# Patient Record
Sex: Male | Born: 1965
Health system: Southern US, Community
[De-identification: ages and names within clinical notes are randomized; demographics above are authoritative.]

## PROBLEM LIST (undated history)

## (undated) DIAGNOSIS — R2990 Unspecified symptoms and signs involving the nervous system: Secondary | ICD-10-CM

## (undated) DIAGNOSIS — L0292 Furuncle, unspecified: Secondary | ICD-10-CM

## (undated) DIAGNOSIS — R51 Headache: Secondary | ICD-10-CM

## (undated) DIAGNOSIS — I639 Cerebral infarction, unspecified: Secondary | ICD-10-CM

## (undated) DIAGNOSIS — L0293 Carbuncle, unspecified: Secondary | ICD-10-CM

## (undated) DIAGNOSIS — F1490 Cocaine use, unspecified, uncomplicated: Secondary | ICD-10-CM

## (undated) HISTORY — PX: HERNIA REPAIR: SHX51

---

## 1997-09-25 ENCOUNTER — Encounter: Admission: RE | Admit: 1997-09-25 | Discharge: 1997-09-25 | Payer: Self-pay | Admitting: *Deleted

## 1998-01-18 ENCOUNTER — Emergency Department (HOSPITAL_COMMUNITY): Admission: EM | Admit: 1998-01-18 | Discharge: 1998-01-18 | Payer: Self-pay

## 1998-01-19 ENCOUNTER — Emergency Department (HOSPITAL_COMMUNITY): Admission: EM | Admit: 1998-01-19 | Discharge: 1998-01-19 | Payer: Self-pay | Admitting: Emergency Medicine

## 1998-05-11 ENCOUNTER — Encounter: Payer: Self-pay | Admitting: Emergency Medicine

## 1998-05-11 ENCOUNTER — Emergency Department (HOSPITAL_COMMUNITY): Admission: EM | Admit: 1998-05-11 | Discharge: 1998-05-11 | Payer: Self-pay | Admitting: Emergency Medicine

## 1998-11-05 ENCOUNTER — Encounter: Payer: Self-pay | Admitting: Emergency Medicine

## 1998-11-05 ENCOUNTER — Emergency Department (HOSPITAL_COMMUNITY): Admission: EM | Admit: 1998-11-05 | Discharge: 1998-11-05 | Payer: Self-pay | Admitting: Emergency Medicine

## 1999-03-10 ENCOUNTER — Emergency Department (HOSPITAL_COMMUNITY): Admission: EM | Admit: 1999-03-10 | Discharge: 1999-03-10 | Payer: Self-pay | Admitting: Emergency Medicine

## 1999-03-13 ENCOUNTER — Ambulatory Visit (HOSPITAL_BASED_OUTPATIENT_CLINIC_OR_DEPARTMENT_OTHER): Admission: RE | Admit: 1999-03-13 | Discharge: 1999-03-13 | Payer: Self-pay | Admitting: General Surgery

## 1999-03-13 ENCOUNTER — Encounter (INDEPENDENT_AMBULATORY_CARE_PROVIDER_SITE_OTHER): Payer: Self-pay | Admitting: *Deleted

## 1999-12-02 ENCOUNTER — Emergency Department (HOSPITAL_COMMUNITY): Admission: EM | Admit: 1999-12-02 | Discharge: 1999-12-02 | Payer: Self-pay | Admitting: Emergency Medicine

## 2000-01-04 ENCOUNTER — Emergency Department (HOSPITAL_COMMUNITY): Admission: EM | Admit: 2000-01-04 | Discharge: 2000-01-04 | Payer: Self-pay | Admitting: *Deleted

## 2000-03-15 ENCOUNTER — Emergency Department (HOSPITAL_COMMUNITY): Admission: EM | Admit: 2000-03-15 | Discharge: 2000-03-15 | Payer: Self-pay | Admitting: *Deleted

## 2000-03-18 ENCOUNTER — Encounter: Payer: Self-pay | Admitting: General Surgery

## 2000-03-18 ENCOUNTER — Encounter: Admission: RE | Admit: 2000-03-18 | Discharge: 2000-03-18 | Payer: Self-pay | Admitting: General Surgery

## 2000-12-07 ENCOUNTER — Emergency Department (HOSPITAL_COMMUNITY): Admission: EM | Admit: 2000-12-07 | Discharge: 2000-12-07 | Payer: Self-pay | Admitting: Emergency Medicine

## 2000-12-09 ENCOUNTER — Emergency Department (HOSPITAL_COMMUNITY): Admission: EM | Admit: 2000-12-09 | Discharge: 2000-12-09 | Payer: Self-pay | Admitting: Emergency Medicine

## 2001-08-13 ENCOUNTER — Encounter: Payer: Self-pay | Admitting: Emergency Medicine

## 2001-08-13 ENCOUNTER — Emergency Department (HOSPITAL_COMMUNITY): Admission: EM | Admit: 2001-08-13 | Discharge: 2001-08-13 | Payer: Self-pay | Admitting: Emergency Medicine

## 2001-12-04 ENCOUNTER — Emergency Department (HOSPITAL_COMMUNITY): Admission: EM | Admit: 2001-12-04 | Discharge: 2001-12-04 | Payer: Self-pay | Admitting: Emergency Medicine

## 2002-10-09 ENCOUNTER — Emergency Department (HOSPITAL_COMMUNITY): Admission: EM | Admit: 2002-10-09 | Discharge: 2002-10-09 | Payer: Self-pay | Admitting: Emergency Medicine

## 2002-12-05 ENCOUNTER — Emergency Department (HOSPITAL_COMMUNITY): Admission: EM | Admit: 2002-12-05 | Discharge: 2002-12-05 | Payer: Self-pay | Admitting: *Deleted

## 2002-12-09 ENCOUNTER — Emergency Department (HOSPITAL_COMMUNITY): Admission: AD | Admit: 2002-12-09 | Discharge: 2002-12-09 | Payer: Self-pay | Admitting: Family Medicine

## 2003-11-04 ENCOUNTER — Emergency Department (HOSPITAL_COMMUNITY): Admission: EM | Admit: 2003-11-04 | Discharge: 2003-11-04 | Payer: Self-pay | Admitting: Emergency Medicine

## 2004-03-10 ENCOUNTER — Emergency Department (HOSPITAL_COMMUNITY): Admission: EM | Admit: 2004-03-10 | Discharge: 2004-03-10 | Payer: Self-pay | Admitting: Emergency Medicine

## 2004-03-11 ENCOUNTER — Emergency Department (HOSPITAL_COMMUNITY): Admission: EM | Admit: 2004-03-11 | Discharge: 2004-03-11 | Payer: Self-pay | Admitting: Emergency Medicine

## 2004-04-15 ENCOUNTER — Emergency Department (HOSPITAL_COMMUNITY): Admission: EM | Admit: 2004-04-15 | Discharge: 2004-04-15 | Payer: Self-pay | Admitting: Emergency Medicine

## 2005-04-30 ENCOUNTER — Emergency Department (HOSPITAL_COMMUNITY): Admission: EM | Admit: 2005-04-30 | Discharge: 2005-04-30 | Payer: Self-pay | Admitting: Emergency Medicine

## 2005-06-18 ENCOUNTER — Inpatient Hospital Stay (HOSPITAL_COMMUNITY): Admission: EM | Admit: 2005-06-18 | Discharge: 2005-06-22 | Payer: Self-pay | Admitting: Emergency Medicine

## 2005-06-18 ENCOUNTER — Ambulatory Visit: Payer: Self-pay | Admitting: Cardiology

## 2005-06-21 ENCOUNTER — Encounter: Payer: Self-pay | Admitting: Cardiology

## 2005-09-30 ENCOUNTER — Emergency Department (HOSPITAL_COMMUNITY): Admission: EM | Admit: 2005-09-30 | Discharge: 2005-09-30 | Payer: Self-pay | Admitting: Emergency Medicine

## 2006-04-09 ENCOUNTER — Inpatient Hospital Stay (HOSPITAL_COMMUNITY): Admission: EM | Admit: 2006-04-09 | Discharge: 2006-04-12 | Payer: Self-pay | Admitting: Emergency Medicine

## 2006-04-11 ENCOUNTER — Encounter (INDEPENDENT_AMBULATORY_CARE_PROVIDER_SITE_OTHER): Payer: Self-pay | Admitting: Interventional Cardiology

## 2006-10-28 ENCOUNTER — Emergency Department (HOSPITAL_COMMUNITY): Admission: EM | Admit: 2006-10-28 | Discharge: 2006-10-28 | Payer: Self-pay | Admitting: Emergency Medicine

## 2007-06-07 ENCOUNTER — Emergency Department (HOSPITAL_COMMUNITY): Admission: EM | Admit: 2007-06-07 | Discharge: 2007-06-07 | Payer: Self-pay | Admitting: Emergency Medicine

## 2008-03-29 ENCOUNTER — Emergency Department (HOSPITAL_COMMUNITY): Admission: EM | Admit: 2008-03-29 | Discharge: 2008-03-29 | Payer: Self-pay | Admitting: Emergency Medicine

## 2009-08-16 ENCOUNTER — Emergency Department: Payer: Self-pay | Admitting: Internal Medicine

## 2010-01-21 ENCOUNTER — Emergency Department (HOSPITAL_COMMUNITY)
Admission: EM | Admit: 2010-01-21 | Discharge: 2010-01-21 | Payer: Self-pay | Source: Home / Self Care | Admitting: Emergency Medicine

## 2010-07-10 NOTE — Discharge Summary (Signed)
NAME:  Derek French, Derek French                ACCOUNT NO.:  [id number]   MEDICAL RECORD NO.:  [id number]          PATIENT TYPE:  INP   LOCATION:  3016                         FACILITY:  [facility]   PHYSICIAN:  Pramod P. Pearlean Brownie, MD    DATE OF BIRTH:  1965-09-22   DATE OF ADMISSION:  04/09/2006  DATE OF DISCHARGE:  04/12/2006                               DISCHARGE SUMMARY   ADMISSION DIAGNOSIS:  Code stroke.   DISCHARGE DIAGNOSES:  1. Right brain transient ischemic attack, likely due to cocaine-      related spasm.  2. Drug abuse.  3. Previous stroke.   [name] is a 40-year African American gentleman who woke up on the day  of admission with severe headache and left-sided numbness and tingling  mainly involving his face and upper extremity.  He has stated that he  had had similar symptoms in the past with his previous strokes in April  2007.  This time the symptoms appeared to be a little worse than the  last occasion.  He also had some slurred speech this time.  He called  EMS but the patient's symptoms started improving and by the time he was  seen in the emergency room by Dr. Deneen Harts it was more than 3  hours from the onset of symptoms and his speech was back to normal and  his tingling also appeared to be improved greatly.  The patient's CT  scan on admission showed no acute hemorrhage or infarct.  He was  admitted to the stroke unit for further evaluation.  MRI scan of the  brain showed no evidence of acute or old strokes.  MRA of the brain was  unremarkable.  A 2-D echo showed normal ejection fraction without  obvious cardiac source of embolism.  Telemetry monitoring did not reveal  cardiac arrhythmias.  Carotid ultrasound showed no significant stenosis  on either side.  Lipid profile was normal except for LDL of 104.  Hemoglobin A1c and homocystine were both normal.  The patient was  started on aspirin for stroke prevention and was counseled to quit  smoking as well as cocaine and  doing drugs.  His urine drug screen was  positive for cocaine.  The patient claimed this was several weeks ago.  He was seen by physical, occupational therapy and there were no long-  term needs identified.  He also had an EEG, the results of which were  pending at the time of discharge.  His admission labs, blood  chemistries, CBC otherwise were unremarkable.  The patient was  discharged home in stable condition, advised to take aspirin a day.  Quit smoking and abusing cocaine and other drugs.  He was advised to  follow-up in the future as needed.   DISCHARGE MEDICATIONS:  Aspirin 325 mg a day.           ______________________________  Sunny Schlein. Pearlean Brownie, MD     PPS/MEDQ  D:  04/12/2006  T:  04/12/2006  Job:  [id number]

## 2010-07-10 NOTE — Discharge Summary (Signed)
NAME:  Derek, French                ACCOUNT NO.:  [id number]   MEDICAL RECORD NO.:  [id number]          PATIENT TYPE:  INP   LOCATION:  3013                         FACILITY:  [facility]   PHYSICIAN:  Pramod P. Pearlean Brownie, MD    DATE OF BIRTH:  [birth year]   DATE OF ADMISSION:  06/18/2005  DATE OF DISCHARGE:  06/22/2005                                 DISCHARGE SUMMARY   ADMISSION DIAGNOSIS:  Code stroke.   DISCHARGE DIAGNOSES:  1.  Small right middle cerebral artery infarction, not visualized on MRI,      secondary to cocaine abuse.  2.  Cocaine abuse.  3.  Smoking.   HOSPITAL COURSE:  [name] is a 45 year old African American gentleman who  developed sudden onset of left body numbness, incoordination, and vision  difficulties at 10 a.m. on the day of admission.  He presented to [facility] more than 3 hours after onset of his symptoms.  When  evaluated in the emergency room, he demonstrated numbness and hemianopsia  left nasolabial fold asymmetry, some sensory incoordination in left upper  extremity, and mild left lower extremity weakness.  On the NIH stroke scale,  he scored 7.  The patient was not a candidate for thrombolysis due to the  timing of the deficit.  He, however, qualified for participation in the  SENTIS acute stroke research protocol.  After discussing risks and benefits  with the patient, he agreed.  The patient was randomized into the medical  treatment part of the study.  He was admitted to the Stroke Unit for  telemetry monitoring.  He underwent an MRI scan of the brain, which did not  reveal any acute definite infarct.  The patient's deficits were persisting.  The rest of the labs showed normal homocysteine and hemoglobin A1c.  LDL  cholesterol was slightly elevated at 110, total cholesterol was 159, HDL was  40, triglycerides 42.  Hypercoagulable labs were also sent and were negative  and were all normal.  A 2-D echo showed normal ejection fraction  without  obvious cardiac source of embolism.  He underwent a transesophageal  echocardiogram on the day of discharge, which did not show any obvious  cardiac source of embolism or patent foramen ovale.  He was started on  aspirin for stroke prevention.  He complained of headache for which he was  started on Depakote.  The patient, the day of discharge, was stable.  He  still had some blurred vision in the left eye more towards the periphery, as  well as some left-sided finger-to-nose and knee-to-heel sensory  incoordination, and complained of some subjective sensory loss on the left  side.  His leg strength had improved, but he was still dragging his left  leg.  He was seen by Physical and Occupational Therapy and felt not to  require any long-term needs.  A limited MRI scan of the brain was repeated  on the day of discharge, and again no definite acute stroke was seen;  however, there was an area of nonspecific white matter hyperintensity in the  left  posterior periventricular region.  There was in question of a disease  versus cocaine vasculitis.  The patient did not have any prior significant  headaches to suggest migraines.  The patient was counseled to quit cocaine  and marijuana abuse.  He was arranged to call Dr. Marlis Edelson office to set up  outpatient BCD bubble study and emboli monitoring.  Arrangements were made  to follow the patient at outpatient physical and occupational therapy.  He  was advised to follow up with Dr. Pearlean Brownie in the office in a few months.   DISCHARGE MEDICATIONS:  1.  Aspirin 325 mg a day.  2.  Depakote ER 500 mg a day.           ______________________________  Sunny Schlein. Pearlean Brownie, MD     PPS/MEDQ  D:  08/12/2005  T:  08/12/2005  Job:  [id number]

## 2010-07-10 NOTE — H&P (Signed)
NAME:  Derek French, Derek French                ACCOUNT NO.:  [id number]   MEDICAL RECORD NO.:  [id number]          PATIENT TYPE:  INP   LOCATION:  1825                         FACILITY:  [facility]   PHYSICIAN:  Pramod P. Pearlean Brownie, MD    DATE OF BIRTH:  February 09, 1966   DATE OF ADMISSION:  06/18/2005  DATE OF DISCHARGE:                                HISTORY & PHYSICAL   REFERRING PHYSICIAN:  Joen Laura, MD   REASON FOR REFERRAL:  Code stroke.   HISTORY OF PRESENT ILLNESS:  [name] is a 45 year old African-American  gentleman who developed sudden onset of left body numbness as well as  incoordination and vision difficulties.  The patient states he has been  having the headache on and off for the last couple of weeks but this morning  at 10 o'clock he noticed he could not walk right and his left side of the  body was numb and his coordination was off.  He also noticed vision  difficulty from left field of vision.  He denies any history of slurred  speech or vertigo.  No prior history of stroke, TIA, seizures, or migraine  headaches.  He does get some remote headaches occasionally every few months  which are not quite severe.   PAST MEDICAL HISTORY:  Past medical history unremarkable except for cocaine  abuse.  He admits to having used cocaine about a week ago last.  He does  smoke one pack of cigarettes every 3-4 days.   PAST SURGICAL HISTORY:  None.   MEDICATION ALLERGIES:  NONE.   SOCIAL HISTORY:  The patient lives with his family at home.  He smokes and  uses cocaine.  He also admits to drinking a couple of alcohol drinks every  night.  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   REVIEW OF SYSTEMS:  Negative for any chest pain, fever, cough, shortness of  breath, diarrhea or other illness.   PHYSICAL EXAMINATION:  GENERAL:  Pleasant young healthy-looking African-  American gentleman not in distress.  VITAL SIGNS:  Afebrile, pulse 89 per minute, regular sinus, blood pressure  117/74, respiratory rate 20 per minute, saturations 97% on room air.  HEAD AND NECK:  Head is nontraumatic.  Neck is supple without bruit.  ENT  exam unremarkable.  CARDIAC EXAM:  No murmur or gallop.  LUNGS:  Clear to auscultation.  ABDOMEN:  Soft, nontender.  NEUROLOGICAL:  The patient is pleasant, awake, alert, cooperative.  There is  no aphasia, apraxia, or dysarthria.  Pupils equal/reactive to light and  accommodation.  Face is symmetric bilaterally.  Movements are normal.  Tongue is midline.  The patient has full range of eye movements.  He has a  dense left homonymous hemianopsia.  He has minimum left nasolabial fold  asymmetry.  He has a good cough and gag.  Tongue is midline.  Motor system  exam reveals no upper extremity drift; however, he has weakness of the left  grip and elbow extensors.  Fine finger movements are diminished on the left.  He __________  left upper extremity.  Left lower extremity exam reveals some  significant drift.  He has grade 2/3 strength in left lower extremity.  He  has decreased sensation on the left body including position sense as well as  vibration sense.  He has astereognosis in the left hand.  He has finger-to-  nose dysmetria on the left which is sensory ataxia.  He __________  as well.  His gait was not tested.   DATA REVIEWED TODAY:  CT scan of the head (noncontrast study) reveals no  acute abnormalities in the left hemisphere, no hemorrhage or large signs of  early infarct seen.  EKG reveals normal sinus rhythm.  Admission labs are  pending at this time.   IMPRESSION:  A 45 year old gentleman with sudden onset of left-sided  weakness, numbness and sensory ataxia as well as visual field defects likely  secondary to right hemispheric infarction due to right __________  middle  cerebral artery branch infarct.  Etiology to be determined at the present  time.  He has no vascular risk factors except cocaine abuse and smoking.  The patient has  presented within 6-9 hours of onset of his symptoms.  He  will not qualify for aggressive intervention however he may qualify for  participation in the SENTIS acute stroke study protocol.   PLAN:  I have discussed participation in the SENTIS protocol with the  patient and answered his questions about the study.  He has expressed  interest.  We will obtain baseline CT perfusion study and randomize him into  the study if he qualifies.  He will be admitted to the stroke service and to  the ICU with strict monitoring.  We will check MRI scan of the brain, MRA of  the brain and neck, echocardiogram, Doppler studies and stroke risk  stratification labs.  Physical, occupational and speech therapy consults.  Check urine drug screen.  I spent 1-1/2 hours of critical care time at the  patient's bedside directing his care and evaluating him.           ______________________________  Sunny Schlein. Pearlean Brownie, MD     PPS/MEDQ  D:  06/18/2005  T:  06/18/2005  Job:  [id number]
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
## 2010-07-10 NOTE — Procedures (Signed)
ELECTROENCEPHALOGRAM:  #[id number].   CLINICAL HISTORY:  The patient is a 45 year old who had an episode of  headache, during which time he was able to hear but was unable to see.  He complained of numbness and tingling on the left side.  This study is  being done due to the presence of possible seizure activity - code  #780.39, code #782.0.   DESCRIPTION OF PROCEDURE:  The tracing is carried out on a 32-channel  digital Cadwell recorder reformatted into 16-channel montages with one  devoted to electrocardiogram.  The patient was awake during the  recording.  The International 10/20 system lead placement was used.   DESCRIPTION OF FINDINGS:  Dominant frequency is 9 Hz, 20-30 microvolt  activity.  This is all regulated.  The background activity shows a mixed  frequency of beta and delta range activity.  Photic stimulation was  carried out and induced a driving response from 0-45 Hz.   The electrocardiogram showed a regular sinus rhythm with ventricular  response of 60 beats per minute.   IMPRESSION:  Normal waking record.      Deanna Artis. Sharene Skeans, M.D.  Electronically Signed     [id number]  D:  04/12/2006 01:48:47  T:  04/12/2006 10:50:23  Job #:  [id number]   cc:   Bevelyn Buckles. Nash Shearer, M.D.  Fax: [phone]

## 2011-01-10 ENCOUNTER — Encounter: Payer: Self-pay | Admitting: *Deleted

## 2011-01-10 ENCOUNTER — Emergency Department (HOSPITAL_COMMUNITY)
Admission: EM | Admit: 2011-01-10 | Discharge: 2011-01-10 | Disposition: A | Discharge status: Home / Self Care | Payer: Self-pay | Attending: Emergency Medicine | Admitting: Emergency Medicine

## 2011-01-10 DIAGNOSIS — M79609 Pain in unspecified limb: Secondary | ICD-10-CM | POA: Insufficient documentation

## 2011-01-10 DIAGNOSIS — IMO0002 Reserved for concepts with insufficient information to code with codable children: Secondary | ICD-10-CM | POA: Insufficient documentation

## 2011-01-10 DIAGNOSIS — L02412 Cutaneous abscess of left axilla: Secondary | ICD-10-CM

## 2011-01-10 HISTORY — DX: Furuncle, unspecified: L02.92

## 2011-01-10 HISTORY — DX: Carbuncle, unspecified: L02.93

## 2011-01-10 MED ORDER — HYDROCODONE-ACETAMINOPHEN 5-325 MG PO TABS: 1.0000 | ORAL_TABLET | Freq: Four times a day (QID) | ORAL | Status: AC | PRN

## 2011-01-10 MED ORDER — HYDROMORPHONE HCL PF 2 MG/ML IJ SOLN
2.0000 mg | Freq: Once | INTRAMUSCULAR | Status: AC
  Administered 2011-01-10: 2 mg via INTRAMUSCULAR
  Filled 2011-01-10: qty 1

## 2011-01-10 MED ORDER — HYDROMORPHONE HCL PF 1 MG/ML IJ SOLN
1.0000 mg | Freq: Once | INTRAMUSCULAR | Status: AC
  Administered 2011-01-10: 1 mg via INTRAMUSCULAR
  Filled 2011-01-10: qty 1

## 2011-01-10 MED ORDER — SULFAMETHOXAZOLE-TRIMETHOPRIM 800-160 MG PO TABS: 2.0000 | ORAL_TABLET | Freq: Two times a day (BID) | ORAL | Status: AC

## 2011-01-10 NOTE — ED Notes (Signed)
boild in left axilla, recurrent.  Has had them lanced before.  Painful

## 2011-01-10 NOTE — ED Notes (Signed)
Pt states that he had a stroke last year and takes a aspirin now

## 2011-01-10 NOTE — ED Provider Notes (Signed)
Patient with recurrent left axillary abscesses reports increased pain in left axilla with small amount of discharge.  States he normally has large area of swelling but this time just feels pain and "pulling" sensation, worse with lifting arm.    I&D:  Patient with excessive amounts of scar tissue, incision and drainage of small amount of purulent material with blood.   Have discussed this with Dr Alto Denver and with patient.  Plan for d/c home with PO abx and pain medication, follow up with surgery.  Pt verbalizes agreement and understanding.    Derek French, [place] 01/10/11 [phone]

## 2011-01-11 NOTE — ED Provider Notes (Signed)
Medical screening examination/treatment/procedure(s) were conducted as a shared visit with non-physician practitioner(s) and myself.  I personally evaluated the patient during the encounter. Please see my note for details.  Cyndra Numbers, MD 01/11/11 [phone]

## 2011-03-09 ENCOUNTER — Emergency Department (HOSPITAL_COMMUNITY)
Admission: EM | Admit: 2011-03-09 | Discharge: 2011-03-09 | Disposition: A | Discharge status: Home / Self Care | Payer: Self-pay | Attending: Emergency Medicine | Admitting: Emergency Medicine

## 2011-03-09 ENCOUNTER — Encounter (HOSPITAL_COMMUNITY): Payer: Self-pay | Admitting: *Deleted

## 2011-03-09 DIAGNOSIS — Z7982 Long term (current) use of aspirin: Secondary | ICD-10-CM | POA: Insufficient documentation

## 2011-03-09 DIAGNOSIS — Z8673 Personal history of transient ischemic attack (TIA), and cerebral infarction without residual deficits: Secondary | ICD-10-CM | POA: Insufficient documentation

## 2011-03-09 DIAGNOSIS — IMO0002 Reserved for concepts with insufficient information to code with codable children: Secondary | ICD-10-CM | POA: Insufficient documentation

## 2011-03-09 DIAGNOSIS — M79609 Pain in unspecified limb: Secondary | ICD-10-CM | POA: Insufficient documentation

## 2011-03-09 DIAGNOSIS — L732 Hidradenitis suppurativa: Secondary | ICD-10-CM | POA: Insufficient documentation

## 2011-03-09 MED ORDER — LIDOCAINE-EPINEPHRINE 2 %-1:100000 IJ SOLN
20.0000 mL | INTRAMUSCULAR | Status: DC
  Filled 2011-03-09: qty 20

## 2011-03-09 MED ORDER — HYDROCODONE-ACETAMINOPHEN 5-325 MG PO TABS: 2.0000 | ORAL_TABLET | ORAL | Status: AC | PRN

## 2011-03-09 MED ORDER — SULFAMETHOXAZOLE-TRIMETHOPRIM 800-160 MG PO TABS: 1.0000 | ORAL_TABLET | Freq: Two times a day (BID) | ORAL | Status: AC

## 2011-03-09 MED ORDER — OXYCODONE-ACETAMINOPHEN 5-325 MG PO TABS
1.0000 | ORAL_TABLET | Freq: Once | ORAL | Status: AC
  Administered 2011-03-09: 1 via ORAL
  Filled 2011-03-09: qty 1

## 2011-03-09 NOTE — ED Provider Notes (Signed)
History     CSN: [id number]  Arrival date & time 03/09/11  0757   First MD Initiated Contact with Patient 03/09/11 [phone]      Chief Complaint  Patient presents with  . Abscess    left axilla    (Consider location/radiation/quality/duration/timing/severity/associated sxs/prior treatment) HPI  46 year old male presenting to the ED with chief complaints of boils in left axillary region. Patient has noticed multiple boils to left axillary for the past 4-5 days. Area is very tender to palpation. Patient has been taking ibuprofen and using warm compress without relief. He has similar boils in the past that required lancing. He denies fever.  Past Medical History  Diagnosis Date  . Recurrent boils   . Stroke     2011 treated at [facility]    History reviewed. No pertinent past surgical history.  No family history on file.  History  Substance Use Topics  . Smoking status: Current Everyday Smoker -- 0.5 packs/day    Types: Cigarettes  . Smokeless tobacco: Not on file  . Alcohol Use: No      Review of Systems  All other systems reviewed and are negative.    Allergies  Review of patient's allergies indicates no known allergies.  Home Medications   Current Outpatient Rx  Name Route Sig Dispense Refill  . ASPIRIN 81 MG PO CHEW Oral Chew 81 mg by mouth daily.        BP 134/83  Pulse 84  Temp(Src) 98.2 F (36.8 C) (Oral)  Resp 20  SpO2 96%  Physical Exam  Nursing note and vitals reviewed. Constitutional:       Awake, alert, nontoxic appearance  HENT:  Head: Atraumatic.  Eyes: Right eye exhibits no discharge. Left eye exhibits no discharge.  Neck: Neck supple.  Pulmonary/Chest: Effort normal. He exhibits no tenderness.  Abdominal: There is no tenderness. There is no rebound.  Musculoskeletal: He exhibits no tenderness.       Baseline ROM, no obvious new focal weakness  Neurological:       Mental status and motor strength appears baseline for patient and situation    Skin: No rash noted.     Psychiatric: He has a normal mood and affect.    ED Course  Procedures (including critical care time)  Labs Reviewed - No data to display No results found.   No diagnosis found.  INCISION AND DRAINAGE Performed by: Fayrene Helper Consent: Verbal consent obtained. Risks and benefits: risks, benefits and alternatives were discussed Type: abscess  Body area: Left Axillary  Anesthesia: local infiltration  Local anesthetic: lidocaine 2% w epinephrine  Anesthetic total: 6 ml  Complexity: complex Blunt dissection to break up loculations  Drainage: purulent  Drainage amount: moderate  Packing material: 1/4 in iodoform gauze  Patient tolerance: Patient tolerated the procedure well with no immediate complications.    MDM  Abscess, likely hydraadenitis suppurativa.  Successful I&D.  Will give referral to [facility].  Will prescribe abx and pain meds.  Pt voice understanding.          Fayrene Helper, PA-C 03/09/11 1006

## 2011-03-09 NOTE — ED Notes (Signed)
PA at bedside.

## 2011-03-09 NOTE — ED Notes (Signed)
Pt with multiple abscesses under left arm. On exam pt with abscess with drainage to anterior aspect of upper arm directly above axillary. Pt with non draining abscess to the middle of axillary. Pt reports hx of same. Pt denies fever.

## 2011-03-09 NOTE — ED Notes (Signed)
Pt is here with 2 cysts under left arm that is not draining

## 2011-03-12 NOTE — ED Provider Notes (Signed)
History/physical exam/procedure(s) were performed by non-physician practitioner and as supervising physician I was immediately available for consultation/collaboration. I have reviewed all notes and am in agreement with care and plan.   Hilario Quarry, MD 03/12/11 [phone]

## 2011-03-28 ENCOUNTER — Emergency Department (HOSPITAL_COMMUNITY): Payer: Self-pay

## 2011-03-28 ENCOUNTER — Encounter (HOSPITAL_COMMUNITY): Payer: Self-pay | Admitting: *Deleted

## 2011-03-28 ENCOUNTER — Inpatient Hospital Stay (HOSPITAL_COMMUNITY)
Admission: EM | Admit: 2011-03-28 | Discharge: 2011-03-30 | DRG: 065 | Disposition: A | Discharge status: Home / Self Care | Payer: Self-pay | Attending: Neurology | Admitting: Neurology

## 2011-03-28 DIAGNOSIS — F1910 Other psychoactive substance abuse, uncomplicated: Secondary | ICD-10-CM

## 2011-03-28 DIAGNOSIS — R4789 Other speech disturbances: Secondary | ICD-10-CM | POA: Diagnosis present

## 2011-03-28 DIAGNOSIS — IMO0002 Reserved for concepts with insufficient information to code with codable children: Secondary | ICD-10-CM

## 2011-03-28 DIAGNOSIS — F1110 Opioid abuse, uncomplicated: Secondary | ICD-10-CM | POA: Diagnosis present

## 2011-03-28 DIAGNOSIS — I639 Cerebral infarction, unspecified: Secondary | ICD-10-CM | POA: Diagnosis present

## 2011-03-28 DIAGNOSIS — E785 Hyperlipidemia, unspecified: Secondary | ICD-10-CM | POA: Diagnosis present

## 2011-03-28 DIAGNOSIS — Z8673 Personal history of transient ischemic attack (TIA), and cerebral infarction without residual deficits: Secondary | ICD-10-CM

## 2011-03-28 DIAGNOSIS — F17200 Nicotine dependence, unspecified, uncomplicated: Secondary | ICD-10-CM | POA: Diagnosis present

## 2011-03-28 DIAGNOSIS — F1010 Alcohol abuse, uncomplicated: Secondary | ICD-10-CM | POA: Diagnosis present

## 2011-03-28 DIAGNOSIS — D72829 Elevated white blood cell count, unspecified: Secondary | ICD-10-CM | POA: Diagnosis present

## 2011-03-28 DIAGNOSIS — F1210 Cannabis abuse, uncomplicated: Secondary | ICD-10-CM | POA: Diagnosis present

## 2011-03-28 DIAGNOSIS — I6350 Cerebral infarction due to unspecified occlusion or stenosis of unspecified cerebral artery: Principal | ICD-10-CM | POA: Diagnosis present

## 2011-03-28 DIAGNOSIS — F17210 Nicotine dependence, cigarettes, uncomplicated: Secondary | ICD-10-CM | POA: Diagnosis present

## 2011-03-28 DIAGNOSIS — Z23 Encounter for immunization: Secondary | ICD-10-CM

## 2011-03-28 DIAGNOSIS — G8190 Hemiplegia, unspecified affecting unspecified side: Secondary | ICD-10-CM | POA: Diagnosis present

## 2011-03-28 DIAGNOSIS — Z7982 Long term (current) use of aspirin: Secondary | ICD-10-CM

## 2011-03-28 DIAGNOSIS — F1310 Sedative, hypnotic or anxiolytic abuse, uncomplicated: Secondary | ICD-10-CM | POA: Diagnosis present

## 2011-03-28 DIAGNOSIS — Z888 Allergy status to other drugs, medicaments and biological substances status: Secondary | ICD-10-CM

## 2011-03-28 HISTORY — DX: Headache: R51

## 2011-03-28 LAB — CBC
HCT: 43 % (ref 39.0–52.0)
Hemoglobin: 15.1 g/dL (ref 13.0–17.0)
MCH: 32.1 pg (ref 26.0–34.0)
MCHC: 35.1 g/dL (ref 30.0–36.0)
MCV: 91.5 fL (ref 78.0–100.0)
Platelets: 252 K/uL (ref 150–400)
RBC: 4.7 MIL/uL (ref 4.22–5.81)
RDW: 14.1 % (ref 11.5–15.5)
WBC: 11.2 K/uL — ABNORMAL HIGH (ref 4.0–10.5)

## 2011-03-28 LAB — DIFFERENTIAL
Eosinophils Absolute: 0.1 10*3/uL (ref 0.0–0.7)
Eosinophils Relative: 1 % (ref 0–5)
Lymphs Abs: 3.5 10*3/uL (ref 0.7–4.0)
Monocytes Relative: 13 % — ABNORMAL HIGH (ref 3–12)
Neutrophils Relative %: 56 % (ref 43–77)

## 2011-03-28 LAB — COMPREHENSIVE METABOLIC PANEL WITH GFR
ALT: 17 U/L (ref 0–53)
AST: 16 U/L (ref 0–37)
Albumin: 3.7 g/dL (ref 3.5–5.2)
Alkaline Phosphatase: 62 U/L (ref 39–117)
BUN: 10 mg/dL (ref 6–23)
CO2: 20 meq/L (ref 19–32)
Calcium: 9.5 mg/dL (ref 8.4–10.5)
Chloride: 105 meq/L (ref 96–112)
Creatinine, Ser: 0.79 mg/dL (ref 0.50–1.35)
GFR calc Af Amer: >90 mL/min
GFR calc non Af Amer: >90 mL/min
Glucose, Bld: 91 mg/dL (ref 70–99)
Potassium: 3.5 meq/L (ref 3.5–5.1)
Sodium: 138 meq/L (ref 135–145)
Total Bilirubin: 0.7 mg/dL (ref 0.3–1.2)
Total Protein: 7.1 g/dL (ref 6.0–8.3)

## 2011-03-28 LAB — POCT I-STAT, CHEM 8
HCT: 46 % (ref 39.0–52.0)
Hemoglobin: 15.6 g/dL (ref 13.0–17.0)
Potassium: 3.6 mEq/L (ref 3.5–5.1)
Sodium: 142 mEq/L (ref 135–145)
TCO2: 20 mmol/L (ref 0–100)

## 2011-03-28 LAB — CK TOTAL AND CKMB (NOT AT ARMC)
CK, MB: 4.8 ng/mL — ABNORMAL HIGH (ref 0.3–4.0)
Relative Index: 2 (ref 0.0–2.5)
Total CK: 237 U/L — ABNORMAL HIGH (ref 7–232)

## 2011-03-28 LAB — TROPONIN I: Troponin I: <0.3 ng/mL (ref <0.30)

## 2011-03-28 LAB — APTT: aPTT: 32 s (ref 24–37)

## 2011-03-28 LAB — GLUCOSE, CAPILLARY: Glucose-Capillary: 89 mg/dL (ref 70–99)

## 2011-03-28 MED ORDER — ACETAMINOPHEN 325 MG PO TABS
650.0000 mg | ORAL_TABLET | ORAL | Status: DC | PRN
  Administered 2011-03-29 – 2011-03-30 (×2): 650 mg via ORAL
  Filled 2011-03-28 (×2): qty 2

## 2011-03-28 MED ORDER — MORPHINE SULFATE 4 MG/ML IJ SOLN
4.0000 mg | Freq: Once | INTRAMUSCULAR | Status: AC
  Administered 2011-03-28: 4 mg via INTRAVENOUS

## 2011-03-28 MED ORDER — ASPIRIN 300 MG RE SUPP
300.0000 mg | Freq: Every day | RECTAL | Status: DC
  Administered 2011-03-28: 300 mg via RECTAL
  Filled 2011-03-28 (×2): qty 1

## 2011-03-28 MED ORDER — SODIUM CHLORIDE 0.9 % IV SOLN
INTRAVENOUS | Status: DC
  Administered 2011-03-28 – 2011-03-30 (×4): via INTRAVENOUS

## 2011-03-28 MED ORDER — MIDAZOLAM HCL 5 MG/5ML IJ SOLN
INTRAMUSCULAR | Status: AC | PRN
  Administered 2011-03-28: 1 mg via INTRAVENOUS

## 2011-03-28 MED ORDER — ACETAMINOPHEN 650 MG RE SUPP
650.0000 mg | RECTAL | Status: DC | PRN
  Administered 2011-03-28: 650 mg via RECTAL
  Filled 2011-03-28: qty 1

## 2011-03-28 MED ORDER — PANTOPRAZOLE SODIUM 40 MG IV SOLR
40.0000 mg | Freq: Every day | INTRAVENOUS | Status: DC
  Administered 2011-03-28: 40 mg via INTRAVENOUS
  Filled 2011-03-28 (×2): qty 40

## 2011-03-28 MED ORDER — MORPHINE SULFATE 4 MG/ML IJ SOLN
INTRAMUSCULAR | Status: AC
  Filled 2011-03-28: qty 1

## 2011-03-28 MED ORDER — FENTANYL CITRATE 0.05 MG/ML IJ SOLN
INTRAMUSCULAR | Status: AC | PRN
  Administered 2011-03-28: 25 ug via INTRAVENOUS

## 2011-03-28 MED ORDER — SODIUM CHLORIDE 0.9 % IV SOLN
INTRAVENOUS | Status: AC
  Administered 2011-03-28: 17:00:00 via INTRAVENOUS

## 2011-03-28 MED ORDER — HEPARIN SOD (PORK) LOCK FLUSH 100 UNIT/ML IV SOLN
500.0000 [IU] | Freq: Once | INTRAVENOUS | Status: AC
  Administered 2011-03-28: 500 [IU] via INTRAVENOUS

## 2011-03-28 MED ORDER — SENNOSIDES-DOCUSATE SODIUM 8.6-50 MG PO TABS
1.0000 | ORAL_TABLET | Freq: Every evening | ORAL | Status: DC | PRN
  Filled 2011-03-28: qty 1

## 2011-03-28 MED ORDER — KETOROLAC TROMETHAMINE 30 MG/ML IJ SOLN
30.0000 mg | Freq: Once | INTRAMUSCULAR | Status: AC
  Administered 2011-03-28: 30 mg via INTRAVENOUS
  Filled 2011-03-28: qty 1

## 2011-03-28 MED ORDER — IOHEXOL 300 MG/ML  SOLN
150.0000 mL | Freq: Once | INTRAMUSCULAR | Status: AC | PRN
  Administered 2011-03-28: 75 mL via INTRAVENOUS

## 2011-03-28 MED ORDER — ONDANSETRON HCL 4 MG/2ML IJ SOLN: 4.0000 mg | Freq: Four times a day (QID) | INTRAMUSCULAR | Status: DC | PRN

## 2011-03-28 MED ORDER — LABETALOL HCL 5 MG/ML IV SOLN: 10.0000 mg | INTRAVENOUS | Status: DC | PRN

## 2011-03-28 MED ORDER — INFLUENZA VIRUS VACC SPLIT PF IM SUSP
0.5000 mL | INTRAMUSCULAR | Status: AC
  Administered 2011-03-29: 0.5 mL via INTRAMUSCULAR
  Filled 2011-03-28: qty 0.5

## 2011-03-28 NOTE — H&P (Addendum)
Chief Complaint: "right hemiparesis, slurred speech"  HPI: Derek French is an 46 y.o. male who comes in with right-sided weakness. He had a headache in am and went to sleep at 9:00 am. He woke up with symptoms of slurred speech and right-sided weakness at 12:00 pm. Came in as a stroke code. Not t-PA candidate due to last seen normal putting him out of t-PA window. NIHSS of 7. Dr. Peggye Form from interventional radiology was called for emergency angiogram and an attempt at embolectomy if a visible thrombus was seen. History of "blood clots" in the brain per daughter.   LSN: 9:00 am tPA Given: No: out of window mRankin: 0  Past Medical History  Diagnosis Date  . Recurrent boils   . Stroke     2011 treated at [facility]   History reviewed. No pertinent past surgical history.  No family history on file. Social History:  reports that he has been smoking Cigarettes.  He has been smoking about 1 pack per day. He does not have any smokeless tobacco history on file. He reports that he drinks alcohol. He reports that he does not use illicit drugs.  Allergies:  Allergies  Allergen Reactions  . Codeine    Medications: I have reviewed the patient's current medications.  ROS: As above  Physical Examination: Blood pressure 127/78, pulse 73, resp. rate 17, SpO2 97.00%.  Neurologic Examination: MS: AAO*3, no aphasia, follows complex commands CN: EOMI, PERRL, right facial droop, mild dysarthria, V1-V3 sensation is reduced in right V1V-3 Motor: right arm drift, 3-/5 strength in RUE, 2/5 strength in RLE, 5/5 strength in LUE and LLE Sensory: decreased sensation in right UE and right LE Coord: F to N intact b/l Reflexes: 2+ throghout Gait: deferred  Results for orders placed during the hospital encounter of 03/28/11 (from the past 48 hour(s))  PROTIME-INR     Status: Normal   Collection Time   03/28/11  1:20 PM      Component Value Range Comment   Prothrombin Time 13.4  11.6 - 15.2 (seconds)    INR 1.00  0.00 - 1.49    APTT     Status: Normal   Collection Time   03/28/11  1:20 PM      Component Value Range Comment   aPTT 32  24 - 37 (seconds)   CBC     Status: Abnormal   Collection Time   03/28/11  1:20 PM      Component Value Range Comment   WBC 11.2 (*) 4.0 - 10.5 (K/uL)    RBC 4.70  4.22 - 5.81 (MIL/uL)    Hemoglobin 15.1  13.0 - 17.0 (g/dL)    HCT 57.8  46.9 - 62.9 (%)    MCV 91.5  78.0 - 100.0 (fL)    MCH 32.1  26.0 - 34.0 (pg)    MCHC 35.1  30.0 - 36.0 (g/dL)    RDW 52.8  41.3 - 24.4 (%)    Platelets 252  150 - 400 (K/uL)   DIFFERENTIAL     Status: Abnormal   Collection Time   03/28/11  1:20 PM      Component Value Range Comment   Neutrophils Relative 56  43 - 77 (%)    Neutro Abs 6.2  1.7 - 7.7 (K/uL)    Lymphocytes Relative 31  12 - 46 (%)    Lymphs Abs 3.5  0.7 - 4.0 (K/uL)    Monocytes Relative 13 (*) 3 - 12 (%)  Monocytes Absolute 1.4 (*) 0.1 - 1.0 (K/uL)    Eosinophils Relative 1  0 - 5 (%)    Eosinophils Absolute 0.1  0.0 - 0.7 (K/uL)    Basophils Relative 0  0 - 1 (%)    Basophils Absolute 0.0  0.0 - 0.1 (K/uL)   GLUCOSE, CAPILLARY     Status: Normal   Collection Time   03/28/11  1:36 PM      Component Value Range Comment   Glucose-Capillary 89  70 - 99 (mg/dL)   POCT I-STAT, CHEM 8     Status: Normal   Collection Time   03/28/11  1:36 PM      Component Value Range Comment   Sodium 142  135 - 145 (mEq/L)    Potassium 3.6  3.5 - 5.1 (mEq/L)    Chloride 108  96 - 112 (mEq/L)    BUN 9  6 - 23 (mg/dL)    Creatinine, Ser 7.82  0.50 - 1.35 (mg/dL)    Glucose, Bld 91  70 - 99 (mg/dL)    Calcium, Ion 9.56  1.12 - 1.32 (mmol/L)    TCO2 20  0 - 100 (mmol/L)    Hemoglobin 15.6  13.0 - 17.0 (g/dL)    HCT 21.3  08.6 - 57.8 (%)    Ct Head Wo Contrast  03/28/2011  *RADIOLOGY REPORT*  Clinical Data: Code stroke, slurred speech, headache  CT HEAD WITHOUT CONTRAST  Technique:  Contiguous axial images were obtained from the base of the skull through the vertex  without contrast.  Comparison: Brain MRI 04/09/2006, head CT 04/09/2006  Findings: Streak artifact from the patient's earrings noted. No acute hemorrhage, acute infarction, or mass lesion is identified. No midline shift.  No ventriculomegaly.  No skull fracture.  Orbits and paranasal sinuses are unremarkable.  IMPRESSION: No acute intracranial finding. These results were called by telephone on 03/28/2011  at  1:25 p.m. to  Dr. Manus Gunning, who verbally acknowledged these results.  Original Report Authenticated By: Harrel Lemon, M.D.   Dg Chest Portable 1 View  03/28/2011  *RADIOLOGY REPORT*  Clinical Data: Altered mental status, code stroke  PORTABLE CHEST - 1 VIEW  Comparison: 06/18/2005  Findings: Lower lung volumes than before accentuating the heart size and vascular markings.  No definite CHF, pneumonia, collapse, the consolidation, effusion, or pneumothorax.  IMPRESSION: Low volume chest exam.  No acute process.  Original Report Authenticated By: Judie Petit. Ruel Favors, M.D.   Assessment: 46 y.o. male who comes in with right hemiparesis and slurred speech out of t-PA window. Interventional radiology was called in for an emergency angiogram and a possible embolectomy if a visible thrombus is seen  Stroke Risk Factors - none  Plan: 1. HgbA1c, fasting lipid panel 2. MRI, MRA  of the brain without contrast 3. PT consult, OT consult, Speech consult 4. Echocardiogram 5. Carotid dopplers 6. Prophylactic therapy-Antiplatelet med: Aspirin - dose 81 mg daily - will start depending on Dr. Geraldine Contras post-procedure instructions 7. Risk factor modification 8. Emergency angiogram and possible embolectomy if a thrombus is seen 9. ICU 10. Permissive hypertension (MAP b/w 120 to 130) 11. IV fluids 12. Telemetry  Harshaan Whang 03/28/2011, 2:09 PM

## 2011-03-28 NOTE — Procedures (Signed)
S/P 4 vessel cerebral arteriogram  Rt CFA approach. No acute complications. Preliminary findings  1. No occlusions,stenosis dissections aneurysms, or filling defects seen  2. Venous flow WNL

## 2011-03-28 NOTE — Code Documentation (Signed)
Code stroke called at 1313, EDP 1312, stroke team arrived at 68. Patient at CT 1318, CT read 1321 Labs arrived at 1328.  Patient/family states he called his mother at 0900 and c/o a headache, patient went to sleep at 0900 and awoke around 1200 with slurred speech and right side weakness patient called his family around 16 and family brought him to ED.  NIHSS 7.  Dr Corliss Skains paged and notified. Patient to IR 1435

## 2011-03-28 NOTE — ED Notes (Signed)
Patient reports he called his mother at 0900 with complaints of headache,  Patient reported to contact family at 71 with noted slurred specch and right sided deficits

## 2011-03-28 NOTE — ED Provider Notes (Signed)
History     CSN: [id number]  Arrival date & time 03/28/11  1310   First MD Initiated Contact with Patient 03/28/11 1315      Chief Complaint  Patient presents with  . Code Stroke    (Consider location/radiation/quality/duration/timing/severity/associated sxs/prior treatment) HPI Comments: Patient presents from home with a headache and right-sided weakness with slurred speech.  He called his mother around 9:00 and comes complaining of a headache but had normal speech at that time. Around 12:30, family called him again he was having slurred speech and right-sided weakness.  Patient has an expressive aphasia but is complaining of a headache and slurred speech. Denies any chest pain or shortness of breath. He does have previous history of strokes.  The history is provided by the patient and a relative.    Past Medical History  Diagnosis Date  . Recurrent boils   . Stroke     2011 treated at [facility]    History reviewed. No pertinent past surgical history.  No family history on file.  History  Substance Use Topics  . Smoking status: Current Everyday Smoker -- 1.0 packs/day    Types: Cigarettes  . Smokeless tobacco: Not on file  . Alcohol Use: Yes      Review of Systems  Unable to perform ROS: Unstable vital signs    Allergies  Codeine  Home Medications   Current Outpatient Rx  Name Route Sig Dispense Refill  . ASPIRIN 81 MG PO CHEW Oral Chew 81 mg by mouth daily.        BP 124/78  Pulse 65  Resp 17  SpO2 99%  Physical Exam  Constitutional: He appears well-developed and well-nourished. No distress.       Slurred speech, expressive aphasia  HENT:  Head: Normocephalic and atraumatic.  Mouth/Throat: Oropharynx is clear and moist. No oropharyngeal exudate.  Eyes: Conjunctivae and EOM are normal. Pupils are equal, round, and reactive to light.  Neck: Normal range of motion.  Cardiovascular: Normal rate, regular rhythm and normal heart sounds.   Pulmonary/Chest:  Effort normal and breath sounds normal. No respiratory distress.  Abdominal: Soft. There is no tenderness. There is no rebound and no guarding.  Musculoskeletal: He exhibits no edema and no tenderness.  Neurological: He is alert.       Unable to hold her right arm against gravity, unable to hold right leg against gravity, slurred speech with right-sided facial droop and weak grip strength on the right. See neurology consult for full neuro exam.  Skin: Skin is warm.    ED Course  Procedures (including critical care time)  Labs Reviewed  CBC - Abnormal; Notable for the following:    WBC 11.2 (*)    All other components within normal limits  DIFFERENTIAL - Abnormal; Notable for the following:    Monocytes Relative 13 (*)    Monocytes Absolute 1.4 (*)    All other components within normal limits  CK TOTAL AND CKMB - Abnormal; Notable for the following:    Total CK 237 (*)    CK, MB 4.8 (*)    All other components within normal limits  PROTIME-INR  APTT  COMPREHENSIVE METABOLIC PANEL  TROPONIN I  GLUCOSE, CAPILLARY  POCT I-STAT, CHEM 8  URINE RAPID DRUG SCREEN (HOSP PERFORMED)  URINALYSIS, ROUTINE W REFLEX MICROSCOPIC   Ct Head Wo Contrast  03/28/2011  *RADIOLOGY REPORT*  Clinical Data: Code stroke, slurred speech, headache  CT HEAD WITHOUT CONTRAST  Technique:  Contiguous axial images  were obtained from the base of the skull through the vertex without contrast.  Comparison: Brain MRI 04/09/2006, head CT 04/09/2006  Findings: Streak artifact from the patient's earrings noted. No acute hemorrhage, acute infarction, or mass lesion is identified. No midline shift.  No ventriculomegaly.  No skull fracture.  Orbits and paranasal sinuses are unremarkable.  IMPRESSION: No acute intracranial finding. These results were called by telephone on 03/28/2011  at  1:25 p.m. to  Dr. Manus Gunning, who verbally acknowledged these results.  Original Report Authenticated By: Harrel Lemon, M.D.   Dg Chest  Portable 1 View  03/28/2011  *RADIOLOGY REPORT*  Clinical Data: Altered mental status, code stroke  PORTABLE CHEST - 1 VIEW  Comparison: 06/18/2005  Findings: Lower lung volumes than before accentuating the heart size and vascular markings.  No definite CHF, pneumonia, collapse, the consolidation, effusion, or pneumothorax.  IMPRESSION: Low volume chest exam.  No acute process.  Original Report Authenticated By: Judie Petit. Ruel Favors, M.D.     1. CVA (cerebral infarction)       MDM  Headache with R sided weakness and slurred speech.  Code stroke called on arrival.   CT head negative for hemorrhage. Stroke team at bedside.  NIH stroke scale 7. Plan to take patient for angiography and possible embolectomy. He is out of the TPA window as he was last seen normal at 9am.    CRITICAL CARE Performed by: Glynn Octave   Total critical care time: 30  Critical care time was exclusive of separately billable procedures and treating other patients.  Critical care was necessary to treat or prevent imminent or life-threatening deterioration.  Critical care was time spent personally by me on the following activities: development of treatment plan with patient and/or surrogate as well as nursing, discussions with consultants, evaluation of patient's response to treatment, examination of patient, obtaining history from patient or surrogate, ordering and performing treatments and interventions, ordering and review of laboratory studies, ordering and review of radiographic studies, pulse oximetry and re-evaluation of patient's condition.     Glynn Octave, MD 03/28/11 1550

## 2011-03-28 NOTE — ED Notes (Signed)
Code Stroke called at 1313.  Patient arrival 11.  EDP exam 1312.  Stroke team arrival 1321.  LSN 0900.  Pt arrival in CT 1318.  Phlebotomist arrival 1328.  CT read 1327.  IR notified 1339.  Radiology staff at bedside 1430.

## 2011-03-29 ENCOUNTER — Inpatient Hospital Stay (HOSPITAL_COMMUNITY): Payer: Self-pay

## 2011-03-29 DIAGNOSIS — I6789 Other cerebrovascular disease: Secondary | ICD-10-CM

## 2011-03-29 DIAGNOSIS — I6350 Cerebral infarction due to unspecified occlusion or stenosis of unspecified cerebral artery: Secondary | ICD-10-CM

## 2011-03-29 LAB — LIPID PANEL
Cholesterol: 159 mg/dL (ref 0–200)
HDL: 47 mg/dL (ref >39)
Total CHOL/HDL Ratio: 3.4 RATIO
Triglycerides: 60 mg/dL (ref <150)
VLDL: 12 mg/dL (ref 0–40)

## 2011-03-29 LAB — URINALYSIS, ROUTINE W REFLEX MICROSCOPIC
Glucose, UA: NEGATIVE mg/dL
Leukocytes, UA: NEGATIVE
Nitrite: NEGATIVE
Protein, ur: NEGATIVE mg/dL
Urobilinogen, UA: 0.2 mg/dL (ref 0.0–1.0)

## 2011-03-29 LAB — RAPID URINE DRUG SCREEN, HOSP PERFORMED
Amphetamines: NOT DETECTED
Barbiturates: NOT DETECTED
Tetrahydrocannabinol: POSITIVE — AB

## 2011-03-29 LAB — ANTITHROMBIN III: AntiThromb III Func: 87 % (ref 75–120)

## 2011-03-29 LAB — SEDIMENTATION RATE: Sed Rate: 4 mm/hr (ref 0–16)

## 2011-03-29 MED ORDER — PANTOPRAZOLE SODIUM 40 MG PO TBEC
40.0000 mg | DELAYED_RELEASE_TABLET | Freq: Every day | ORAL | Status: DC
  Administered 2011-03-29: 40 mg via ORAL
  Filled 2011-03-29: qty 1

## 2011-03-29 MED ORDER — LORAZEPAM 2 MG/ML IJ SOLN
1.0000 mg | Freq: Once | INTRAMUSCULAR | Status: AC
  Administered 2011-03-29: 1 mg via INTRAVENOUS
  Filled 2011-03-29: qty 1

## 2011-03-29 MED ORDER — SIMVASTATIN 20 MG PO TABS
20.0000 mg | ORAL_TABLET | Freq: Every day | ORAL | Status: DC
  Administered 2011-03-29: 20 mg via ORAL
  Filled 2011-03-29 (×2): qty 1

## 2011-03-29 MED ORDER — ENOXAPARIN SODIUM 40 MG/0.4ML ~~LOC~~ SOLN
40.0000 mg | SUBCUTANEOUS | Status: DC
  Administered 2011-03-29: 40 mg via SUBCUTANEOUS
  Filled 2011-03-29 (×2): qty 0.4

## 2011-03-29 MED ORDER — ASPIRIN EC 325 MG PO TBEC
325.0000 mg | DELAYED_RELEASE_TABLET | Freq: Every day | ORAL | Status: DC
  Administered 2011-03-29: 325 mg via ORAL
  Filled 2011-03-29 (×2): qty 1

## 2011-03-29 NOTE — Progress Notes (Signed)
RN bedside swallow test done. Patient  ate crackers and drank water with and without a straw. No difficulties noted, no choking or coughing.. Lungs remain clear post swallow assessment. Pt was also able to use his rt hand to hold and lift  the cup of water from the table to his mouth.

## 2011-03-29 NOTE — Progress Notes (Signed)
VASCULAR LAB PRELIMINARY  PRELIMINARY  PRELIMINARY  PRELIMINARY  Carotid duplex  completed.    Preliminary report:  Bilateral:  No evidence of hemodynamically significant internal carotid artery stenosis.   Vertebral artery flow is antegrade.      Terance Hart, RVT 03/29/2011, 12:39 PM

## 2011-03-29 NOTE — Progress Notes (Signed)
  Echocardiogram 2D Echocardiogram has been performed.  Ellysia Char, Real Cons 03/29/2011, 3:19 PM

## 2011-03-29 NOTE — Progress Notes (Signed)
Stroke Team Progress Note  HISTORY Derek French is an 46 y.o. male who comes in 03/28/2011 with right-sided weakness. He had a headache in am and went to sleep at 9:00 am. He woke up with symptoms of slurred speech and right-sided weakness at 12:00 pm. Came in as a stroke code 03/28/2011 at 1310.  Not t-PA candidate due to last seen normal putting him out of t-PA window. NIHSS of 7. Dr. Peggye Form from interventional radiology was called for emergency angiogram and an attempt at embolectomy if a visible thrombus was seen. History of "blood clots" in the brain per daughter. Angio normal. No intervention performed.  SUBJECTIVE His mother is at the bedside. Overall he feels his condition is gradually improving. He still has mild right-sided weakness and he also complains of blurred vision on the right. He no longer has headaches.  OBJECTIVE Filed Vitals:   03/29/11 0400 03/29/11 0500 03/29/11 0700 03/29/11 0800  BP: 99/63 104/70 107/66 113/66  Pulse: 58 62 60 55  Temp:  97.5 F (36.4 C) 97.9 F (36.6 C)   TempSrc:  Oral Oral   Resp: 13 13 12 12   Height:      Weight:      SpO2: 96% 98% 97% 98%   CBG (last 3)  Basename 03/28/11 1336  GLUCAP 89   Intake/Output from previous day: 02/03 0701 - 02/04 0700 In: 945 [P.O.:120; I.V.:825] Out: 625 [Urine:625]  IV Fluid Intake:     . sodium chloride 75 mL/hr at 03/29/11 0800  . sodium chloride 75 mL/hr at 03/28/11 1900   Medications    . aspirin  300 mg Rectal Daily  . heparin lock flush  500 Units Intravenous Once  . influenza  inactive virus vaccine  0.5 mL Intramuscular Tomorrow-1000  . ketorolac  30 mg Intravenous Once  .  morphine injection  4 mg Intravenous Once  . pantoprazole (PROTONIX) IV  40 mg Intravenous QHS  PRN acetaminophen, acetaminophen, fentaNYL, iohexol, labetalol, midazolam, ondansetron (ZOFRAN) IV, senna-docusate  Diet:  General thin liquids Activity:  Bedrest  DVT Prophylaxis:  SCDs   Significant Diagnostic  Studies: CBC    Component Value Date/Time   WBC 11.2* 03/28/2011 1320   RBC 4.70 03/28/2011 1320   HGB 15.6 03/28/2011 1336   HCT 46.0 03/28/2011 1336   PLT 252 03/28/2011 1320   MCV 91.5 03/28/2011 1320   MCH 32.1 03/28/2011 1320   MCHC 35.1 03/28/2011 1320   RDW 14.1 03/28/2011 1320   LYMPHSABS 3.5 03/28/2011 1320   MONOABS 1.4* 03/28/2011 1320   EOSABS 0.1 03/28/2011 1320   BASOSABS 0.0 03/28/2011 1320   CMP    Component Value Date/Time   NA 142 03/28/2011 1336   K 3.6 03/28/2011 1336   CL 108 03/28/2011 1336   CO2 20 03/28/2011 1320   GLUCOSE 91 03/28/2011 1336   BUN 9 03/28/2011 1336   CREATININE 0.90 03/28/2011 1336   CALCIUM 9.5 03/28/2011 1320   PROT 7.1 03/28/2011 1320   ALBUMIN 3.7 03/28/2011 1320   AST 16 03/28/2011 1320   ALT 17 03/28/2011 1320   ALKPHOS 62 03/28/2011 1320   BILITOT 0.7 03/28/2011 1320   GFRNONAA >90 03/28/2011 1320   GFRAA >90 03/28/2011 1320   COAGS Lab Results  Component Value Date   INR 1.00 03/28/2011   Lipid Panel    Component Value Date/Time   CHOL 159 03/29/2011 0445   TRIG 60 03/29/2011 0445   HDL 47 03/29/2011 0445   CHOLHDL  3.4 03/29/2011 0445   VLDL 12 03/29/2011 0445   LDLCALC 100* 03/29/2011 0445   HgbA1C  No results found for this basename: HGBA1C   Urine Drug Screen    Component Value Date/Time   LABOPIA POSITIVE* 03/29/2011 0212   COCAINSCRNUR POSITIVE* 03/29/2011 0212   LABBENZ POSITIVE* 03/29/2011 0212   AMPHETMU NONE DETECTED 03/29/2011 0212   THCU POSITIVE* 03/29/2011 0212   LABBARB NONE DETECTED 03/29/2011 0212    Alcohol Level No results found for this basename: eth   CK TOTAL AND CKMB     Status: Abnormal   Collection Time   03/28/11  1:20 PM      Component Value Range   Total CK 237 (*) 7 - 232 (U/L)   CK, MB 4.8 (*) 0.3 - 4.0 (ng/mL)   Relative Index 2.0  0.0 - 2.5   TROPONIN I     Status: Normal   Collection Time   03/28/11  1:20 PM      Component Value Range   Troponin I <0.30  <0.30 (ng/mL)  MRSA PCR SCREENING     Status: Normal   Collection Time   03/28/11  4:07 PM        Component Value Range   MRSA by PCR NEGATIVE  NEGATIVE   URINALYSIS, ROUTINE W REFLEX MICROSCOPIC     Status: Abnormal   Collection Time   03/29/11  2:12 AM      Component Value Range   Color, Urine AMBER (*) YELLOW    APPearance CLEAR  CLEAR    Specific Gravity, Urine 1.020  1.005 - 1.030    pH 5.5  5.0 - 8.0    Glucose, UA NEGATIVE  NEGATIVE (mg/dL)   Hgb urine dipstick NEGATIVE  NEGATIVE    Bilirubin Urine NEGATIVE  NEGATIVE    Ketones, ur NEGATIVE  NEGATIVE (mg/dL)   Protein, ur NEGATIVE  NEGATIVE (mg/dL)   Urobilinogen, UA 0.2  0.0 - 1.0 (mg/dL)   Nitrite NEGATIVE  NEGATIVE    Leukocytes, UA NEGATIVE  NEGATIVE    CT of the brain   No acute intracranial finding.   Cerebral angio (Deveshwar)  Rt CFA approach. No acute complications. Preliminary findings 1. No occlusions,stenosis dissections aneurysms, or filling defects seen 2. Venous flow WNL  MRI of the brain    MRA of the brain    2D Echocardiogram    Carotid Doppler    CXR   Low volume chest exam.  No acute process.    EKG    Physical Exam  Young healthy-looking African American male currently not in distress. Afebrile. Head is nontraumatic. Neck is supple without bruit. Hearing appears normal. Cardiac exam no murmur or gallop. Lungs are clear to auscultation. Neurological exam awake alert oriented to time place and person. Speech and language appears normal. Eye movements are full range without nystagmus. Partial right homonymous hemianopsia. Minimal right lower facial symmetry. Tongue is midline. Motor system exam reveals mild right upper and moderate right lower extruded left. Coordination is impaired on the right. Sensation is diminished on the right. Normal strength tone and coordination sensation on the left. Gait was not tested. ASSESSMENT [name] is a 46 y.o. male with a PCA infarct with right hemiparesis and right visual field cut, secondary to left posterior cerebral artery infarct from unknown  source. Workup underway. On aspirin 300 mg rectally every day for secondary stroke prevention.  -leukocytosis -dyslipidemia with LDL 100 -substance abuse: cocaine, opiates, benzo, THC -cigarette  smoker  Hospital day # 1  TREATMENT/PLAN -Change to aspirin 325 mg orally every day for secondary stroke prevention.  -transfer to the floor -MRI/MRA -hypercoagulable and vasculitis work up -change to Lovenox 40 mg sq daily  -add statin -therapy evals -SW for substance abuse counseling, cigarette smoking and check HIV -TEE for cardiac source of embolism Discussed with patient and mother and answered questions  Joaquin Music, ANP-BC, GNP-BC [facility] Pager: [phone] 03/29/2011 8:47 AM  Dr. Delia Heady, Stroke Center Medical Director, has personally reviewed chart, pertinent data, examined the patient and developed the plan of care.

## 2011-03-29 NOTE — Progress Notes (Signed)
Pt has voided 625 mls of concentrated amber urine. Urinalysis and urine drug screen collected and sent.

## 2011-03-29 NOTE — Progress Notes (Signed)
Pt has not voided since admission. Bladder scan performed and resulted 561 ml of urine. Dr Melven Sartorius paged and informed. Order received to I/C x 1 and continue to reassess and evaluate. RN bedside swallow assessment was successful order received to start diet. Pt informed.

## 2011-03-29 NOTE — Evaluation (Signed)
Physical Therapy Evaluation Patient Details Name: Derek French MRN: [id number] DOB: 03/11/1965 Today's Date: 03/29/2011  Problem List: There is no problem list on file for this patient.   Past Medical History:  Past Medical History  Diagnosis Date  . Recurrent boils   . Stroke     2011 treated at [facility]  . Headache    Past Surgical History: History reviewed. No pertinent past surgical history.  PT Assessment/Plan/Recommendation PT Assessment Clinical Impression Statement: Pt presents with Rt sided weakness and numbness with inconsistencies in his strength assessment and functional use of RLE. Anticipate will make quick progress (pt reports strength has significantly improved) and will benefit from continued PT for mobility training. PT Recommendation/Assessment: Patient will need skilled PT in the acute care venue PT Problem List: Decreased strength;Decreased balance;Decreased mobility;Decreased knowledge of use of DME;Impaired sensation Barriers to Discharge: None Barriers to Discharge Comments: 3rd floor apartment, pt plans to stay at his mother's one-level home PT Therapy Diagnosis : Difficulty walking;Hemiplegia dominant side PT Plan PT Frequency: Min 4X/week PT Treatment/Interventions: DME instruction;Gait training;Stair training;Functional mobility training;Therapeutic activities;Balance training;Neuromuscular re-education;Patient/family education PT Recommendation Follow Up Recommendations: Outpatient PT (pt wants to ultimately return to work) Equipment Recommended: None recommended by PT PT Goals  Acute Rehab PT Goals PT Goal Formulation: With patient Time For Goal Achievement: 7 days Pt will go Supine/Side to Sit: with modified independence;with HOB 0 degrees PT Goal: Supine/Side to Sit - Progress: Goal set today Pt will go Sit to Supine/Side: with modified independence;with HOB 0 degrees PT Goal: Sit to Supine/Side - Progress: Goal set today Pt will go Sit to Stand:  with modified independence;without upper extremity assist PT Goal: Sit to Stand - Progress: Goal set today Pt will go Stand to Sit: with modified independence;without upper extremity assist PT Goal: Stand to Sit - Progress: Goal set today Pt will Ambulate: >150 feet;with modified independence;with least restrictive assistive device PT Goal: Ambulate - Progress: Goal set today Pt will Go Up / Down Stairs: 1-2 stairs;with min assist PT Goal: Up/Down Stairs - Progress: Goal set today  PT Evaluation Precautions/Restrictions  Precautions Precautions: Fall Required Braces or Orthoses: No Restrictions Weight Bearing Restrictions: No Prior Functioning  Home Living Lives With: Alone Receives Help From: Family (Mother in good health, 22 yo daughter) Type of Home: House (Pt's apt on 3rd floor; will stay with mother one-level house) Home Layout: One level Bathroom Toilet: Standard Home Adaptive Equipment: None Prior Function Level of Independence: Independent with basic ADLs;Independent with homemaking with ambulation;Independent with gait Vocation: Full time employment Cognition Cognition Arousal/Alertness: Awake/alert Overall Cognitive Status: Appears within functional limits for tasks assessed Orientation Level: Oriented X4 Sensation/Coordination Sensation Light Touch: Impaired by gross assessment Additional Comments: Reports entire Rt side feels "asleep...pins and needles" Coordination Gross Motor Movements are Fluid and Coordinated: No Coordination and Movement Description: requires assist with RLE ROM due to weakness; bed mobility with extraneous movement to come to EOB Extremity Assessment RLE Assessment RLE Assessment: Exceptions to [facility] RLE AROM (degrees) Overall AROM Right Lower Extremity: Deficits;Due to decreased strength RLE Overall AROM Comments: Pt actively flexing Rt hip <20 degrees in supine (full AAROM); actively extending toes with no dorsiflexion (full PROM) RLE  Strength RLE Overall Strength: Deficits RLE Overall Strength Comments: Pt with noted inconsistencies--in supine, hip flexion 2+/5, knee extension 2+/5, dorsiflexion 0/5; in standing--able to perform mini-squats with no buckling; on sit to supine, able to lift LE onto bed without assist LLE Assessment LLE Assessment: Within Functional Limits  Mobility (including Balance) Bed Mobility Bed Mobility: Yes Rolling Right: 5: Supervision Rolling Right Details (indicate cue type and reason): cues for encouragement technique; pt with incr time and effort Right Sidelying to Sit: 5: Supervision;HOB flat Right Sidelying to Sit Details (indicate cue type and reason): Pt able to take legs off EOB without assist; as pushing up to sitting, pt with incr Rt rotation of torso (pt's torso literally facing towards mattress and pushed up with LUE)  Sitting - Scoot to Edge of Bed: 5: Supervision Sitting - Scoot to [place] of Bed Details (indicate cue type and reason): incr time and effort Sit to Supine: 5: Supervision;HOB flat Sit to Supine - Details (indicate cue type and reason): able to lift RLE up onto mattress without physical assist Transfers Transfers: Yes Sit to Stand: 4: Min assist;From bed;With upper extremity assist Sit to Stand Details (indicate cue type and reason): min assist with Rt knee blocked for safety; no knee buckling; weight evenly distributed over bil legs Stand to Sit: Other (comment);To bed Stand to Sit Details: minguard assist; returned to bed for transport to MRI Ambulation/Gait Ambulation/Gait: Yes Ambulation/Gait Assistance: 4: Min assist Ambulation/Gait Assistance Details (indicate cue type and reason): side-stepping to Rt (to [facility]) with assist to place RLE closer (pt taking too large a step) Ambulation Distance (Feet): 2 Feet Assistive device: None  Posture/Postural Control Posture/Postural Control: No significant limitations Balance Balance Assessed: Yes Static Sitting  Balance Static Sitting - Balance Support: No upper extremity supported;Feet supported Static Sitting - Level of Assistance: 7: Independent Static Sitting - Comment/# of Minutes: EOB total of 10 minutes  Static Standing Balance Static Standing - Balance Support: No upper extremity supported Static Standing - Level of Assistance: 4: Min assist Static Standing - Comment/# of Minutes: 3 minutes total; no knee buckling Dynamic Standing Balance Dynamic Standing - Balance Support: No upper extremity supported;During functional activity Dynamic Standing - Level of Assistance: 4: Min assist Dynamic Standing - Balance Activities: Lateral lean/weight shifting;Forward lean/weight shifting;Other (comment) Dynamic Standing - Comments: stepping forward and backward--pt with no buckling and normal weight shift when advancing LLE; pt with difficulty advancing RLE (uses excessive hip flexion, hip hike, Lt lateral lean with toes dragging as advances RLE) Exercise    End of Session PT - End of Session Equipment Utilized During Treatment: Gait belt Activity Tolerance: Patient tolerated treatment well Patient left: in bed;with call bell in reach (to go to MRI via bed) Nurse Communication: Mobility status for transfers General Behavior During Session: [facility] for tasks performed Cognition: [facility] for tasks performed  [place] 03/29/2011, 4:32 PM Pager [phone]

## 2011-03-29 NOTE — Evaluation (Signed)
Speech Language Pathology Evaluation Patient Details Name: Derek French MRN: [id number] DOB: 1965/06/22 Today's Date: 03/29/2011  Problem List: There is no problem list on file for this patient.  Past Medical History:  Past Medical History  Diagnosis Date  . Recurrent boils   . Stroke     2011 treated at [facility]  . Headache    Past Surgical History: History reviewed. No pertinent past surgical history.  SLP Assessment/Plan/Recommendation Assessment Clinical Impression Statement: Patient exhibits normal speech, language and cognitive abilities at this time.  Previous deficits have resolved.   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[employer])  Cognition Overall Cognitive Status: Appears within functional limits for tasks assessed Arousal/Alertness: Awake/alert Orientation Level: Oriented X4 Attention: Divided Divided Attention: Appears intact Memory: Appears intact Awareness: Appears intact Problem Solving: Appears intact Safety/Judgment: Appears intact  Comprehension Auditory Comprehension Overall Auditory Comprehension: Appears within functional limits for tasks assessed Visual Recognition/Discrimination Discrimination: Not tested (Patient reports his vision is blurry in his right eye.) Reading Comprehension Reading Status: Not tested  Expression Expression Primary Mode of Expression: Verbal Verbal Expression Overall  Verbal Expression: Appears within functional limits for tasks assessed Level of Generative/Spontaneous Verbalization: Conversation Repetition: No impairment Naming: No impairment Pragmatics: No impairment Written Expression Written Expression: Not tested  Oral/Motor Oral Motor/Sensory Function Overall Oral Motor/Sensory Function: Appears within functional limits for tasks assessed Motor Speech Overall Motor Speech: Appears within functional limits for tasks assessed Respiration: Within functional limits Phonation: Normal Resonance: Within functional limits Articulation: Within functional limitis Intelligibility: Intelligible Motor Planning: Witnin functional limits  Maryjo Rochester T 03/29/2011, 10:58 AM

## 2011-03-29 NOTE — Progress Notes (Signed)
Urinalysis resulted negative. Urine Drug Screen resulted positive for Benzodiazepine, Opiates, Cocaine and Tetra hydrocannabinol. Dr Melven Sartorius paged and informed.

## 2011-03-29 NOTE — Plan of Care (Signed)
Problem: Consults Goal: RH STROKE PATIENT EDUCATION See Patient Education module for education specifics  Outcome: Completed/Met Date Met:  03/29/11 [place]  1 Explaining  Stroke 2. Steps Against Recurrent Stroke 3. Link between PFO and Stroke 4. Intracranal Atherosclerosis 5. Clinical Trials NSA 6. Stroke and Vision 7. Stroke ans Social Security Disability Insurance 8. High Blood Pressure 9. Caregivers and Stroke      Problem: RH BLADDER ELIMINATION Goal: RH STG MANAGE BLADDER WITH ASSISTANCE STG Manage Bladder With Assistance Outcome: Completed/Met Date Met:  03/29/11 Voided without I/C

## 2011-03-30 DIAGNOSIS — F17210 Nicotine dependence, cigarettes, uncomplicated: Secondary | ICD-10-CM | POA: Diagnosis present

## 2011-03-30 DIAGNOSIS — F1910 Other psychoactive substance abuse, uncomplicated: Secondary | ICD-10-CM

## 2011-03-30 DIAGNOSIS — I639 Cerebral infarction, unspecified: Secondary | ICD-10-CM | POA: Diagnosis present

## 2011-03-30 DIAGNOSIS — E785 Hyperlipidemia, unspecified: Secondary | ICD-10-CM | POA: Diagnosis present

## 2011-03-30 DIAGNOSIS — D72829 Elevated white blood cell count, unspecified: Secondary | ICD-10-CM | POA: Diagnosis present

## 2011-03-30 LAB — HIV ANTIBODY (ROUTINE TESTING W REFLEX): HIV: NONREACTIVE

## 2011-03-30 LAB — LUPUS ANTICOAGULANT PANEL
DRVVT: 41.2 secs (ref 34.1–42.2)
Lupus Anticoagulant: NOT DETECTED
PTTLA 4:1 Mix: 49.6 secs — ABNORMAL HIGH (ref 28.0–43.0)

## 2011-03-30 LAB — GLUCOSE, CAPILLARY: Glucose-Capillary: 76 mg/dL (ref 70–99)

## 2011-03-30 LAB — ANA: Anti Nuclear Antibody(ANA): NEGATIVE

## 2011-03-30 LAB — PROTEIN C ACTIVITY: Protein C Activity: 136 % — ABNORMAL HIGH (ref 75–133)

## 2011-03-30 LAB — C3 COMPLEMENT: C3 Complement: 104 mg/dL (ref 90–180)

## 2011-03-30 LAB — C4 COMPLEMENT: Complement C4, Body Fluid: 20 mg/dL (ref 10–40)

## 2011-03-30 MED ORDER — ASPIRIN EC 81 MG PO TBEC: 81.0000 mg | DELAYED_RELEASE_TABLET | Freq: Every day | ORAL | Status: DC

## 2011-03-30 NOTE — Progress Notes (Signed)
Stroke Team Progress Note  HISTORY Derek French is an 46 y.o. male who comes in 03/28/2011 with right-sided weakness. He had a headache in am and went to sleep at 9:00 am. He woke up with symptoms of slurred speech and right-sided weakness at 12:00 pm. Came in as a stroke code 03/28/2011 at 1310.  Not t-PA candidate due to last seen normal putting him out of t-PA window. NIHSS of 7. Dr. Peggye Form from interventional radiology was called for emergency angiogram and an attempt at embolectomy if a visible thrombus was seen. History of "blood clots" in the brain per daughter. Angio normal. No intervention performed.  SUBJECTIVE He reports headache since event. Reports blurry vision to the right.  OBJECTIVE Filed Vitals:   03/29/11 2200 03/30/11 0200 03/30/11 0619 03/30/11 0900  BP: 133/80 108/68 134/83 131/83  Pulse: 58 55 54 48  Temp: 98.4 F (36.9 C) 98.4 F (36.9 C) 97.7 F (36.5 C) 98.2 F (36.8 C)  TempSrc: Oral Oral Oral Oral  Resp: 16 16 18 18   Height:      Weight:      SpO2: 97% 97% 98% 93%   CBG (last 3)  Basename 03/30/11 0655 03/28/11 1336  GLUCAP 76 89   Intake/Output from previous day: 02/04 0701 - 02/05 0700 In: 1455 [P.O.:630; I.V.:825] Out: 950 [Urine:950]  IV Fluid Intake:     . sodium chloride 75 mL/hr at 03/30/11 0729   Medications    . aspirin EC  325 mg Oral Daily  . enoxaparin (LOVENOX) injection  40 mg Subcutaneous Q24H  . influenza  inactive virus vaccine  0.5 mL Intramuscular Tomorrow-1000  . LORazepam  1 mg Intravenous Once  . pantoprazole  40 mg Oral Q1200  . simvastatin  20 mg Oral q1800  PRN acetaminophen, acetaminophen, labetalol, senna-docusate, DISCONTD: ondansetron (ZOFRAN) IV  Diet:  NPO for TEE Activity:  Up with assistance DVT Prophylaxis: Lovenox 40 mg sq daily    Significant Diagnostic Studies: CBC    Component Value Date/Time   WBC 11.2* 03/28/2011 1320   RBC 4.70 03/28/2011 1320   HGB 15.6 03/28/2011 1336   HCT 46.0 03/28/2011 1336   PLT 252 03/28/2011 1320   MCV 91.5 03/28/2011 1320   MCH 32.1 03/28/2011 1320   MCHC 35.1 03/28/2011 1320   RDW 14.1 03/28/2011 1320   LYMPHSABS 3.5 03/28/2011 1320   MONOABS 1.4* 03/28/2011 1320   EOSABS 0.1 03/28/2011 1320   BASOSABS 0.0 03/28/2011 1320   CMP    Component Value Date/Time   NA 142 03/28/2011 1336   K 3.6 03/28/2011 1336   CL 108 03/28/2011 1336   CO2 20 03/28/2011 1320   GLUCOSE 91 03/28/2011 1336   BUN 9 03/28/2011 1336   CREATININE 0.90 03/28/2011 1336   CALCIUM 9.5 03/28/2011 1320   PROT 7.1 03/28/2011 1320   ALBUMIN 3.7 03/28/2011 1320   AST 16 03/28/2011 1320   ALT 17 03/28/2011 1320   ALKPHOS 62 03/28/2011 1320   BILITOT 0.7 03/28/2011 1320   GFRNONAA >90 03/28/2011 1320   GFRAA >90 03/28/2011 1320   COAGS Lab Results  Component Value Date   INR 1.00 03/28/2011   Lipid Panel    Component Value Date/Time   CHOL 159 03/29/2011 0445   TRIG 60 03/29/2011 0445   HDL 47 03/29/2011 0445   CHOLHDL 3.4 03/29/2011 0445   VLDL 12 03/29/2011 0445   LDLCALC 100* 03/29/2011 0445   HgbA1C  Lab Results  Component Value Date  HGBA1C 5.8* 03/29/2011   Urine Drug Screen    Component Value Date/Time   LABOPIA POSITIVE* 03/29/2011 0212   COCAINSCRNUR POSITIVE* 03/29/2011 0212   LABBENZ POSITIVE* 03/29/2011 0212   AMPHETMU NONE DETECTED 03/29/2011 0212   THCU POSITIVE* 03/29/2011 0212   LABBARB NONE DETECTED 03/29/2011 0212    Alcohol Level No results found for this basename: eth   CK TOTAL AND CKMB     Status: Abnormal   Collection Time   03/28/11  1:20 PM      Component Value Range   Total CK 237 (*) 7 - 232 (U/L)   CK, MB 4.8 (*) 0.3 - 4.0 (ng/mL)   Relative Index 2.0  0.0 - 2.5   TROPONIN I     Status: Normal   Collection Time   03/28/11  1:20 PM      Component Value Range   Troponin I <0.30  <0.30 (ng/mL)  MRSA PCR SCREENING     Status: Normal   Collection Time   03/28/11  4:07 PM      Component Value Range   MRSA by PCR NEGATIVE  NEGATIVE   URINALYSIS, ROUTINE W REFLEX MICROSCOPIC     Status: Abnormal    Collection Time   03/29/11  2:12 AM      Component Value Range   Color, Urine AMBER (*) YELLOW    APPearance CLEAR  CLEAR    Specific Gravity, Urine 1.020  1.005 - 1.030    pH 5.5  5.0 - 8.0    Glucose, UA NEGATIVE  NEGATIVE (mg/dL)   Hgb urine dipstick NEGATIVE  NEGATIVE    Bilirubin Urine NEGATIVE  NEGATIVE    Ketones, ur NEGATIVE  NEGATIVE (mg/dL)   Protein, ur NEGATIVE  NEGATIVE (mg/dL)   Urobilinogen, UA 0.2  0.0 - 1.0 (mg/dL)   Nitrite NEGATIVE  NEGATIVE    Leukocytes, UA NEGATIVE  NEGATIVE    Results for orders placed during the hospital encounter of 03/28/11 (from the past 24 hour(s))  C3 COMPLEMENT     Status: Normal   Collection Time   03/29/11  1:30 PM      Component Value Range   C3 Complement 104  90 - 180 (mg/dL)  C4 COMPLEMENT     Status: Normal   Collection Time   03/29/11  1:30 PM      Component Value Range   Complement C4, Body Fluid 20  10 - 40 (mg/dL)  ANTITHROMBIN III     Status: Normal   Collection Time   03/29/11  1:30 PM      Component Value Range   AntiThromb III Func 87  75 - 120 (%)  HOMOCYSTEINE, SERUM     Status: Normal   Collection Time   03/29/11  1:30 PM      Component Value Range   Homocysteine-Norm 7.8  4.0 - 15.4 (umol/L)  RPR     Status: Normal   Collection Time   03/29/11  1:30 PM      Component Value Range   RPR NON REACTIVE  NON REACTIVE   HIV ANTIBODY (ROUTINE TESTING)     Status: Normal   Collection Time   03/29/11  1:30 PM      Component Value Range   HIV NON REACTIVE  NON REACTIVE   SEDIMENTATION RATE     Status: Normal   Collection Time   03/29/11  1:30 PM      Component Value Range   Sed Rate 4  0 - 16 (mm/hr)   CT of the brain   No acute intracranial finding.   MRI of the brain  No acute infarct.  Mild chronic microvascular ischemia in the white matter.  MRA of the brain  Image quality is significantly degraded by motion.  No large vessel occlusion or critical stenosis.  Cerebral angiogram  1.  Angiographically, no evidence of  occlusions, stenoses, dissections, intraluminal filling defects, of aneurysms or dural AV fistula. 2.  Venous outflow within normal limits.    2D Echocardiogram  EF 55-60% with no source of embolus.   Carotid Doppler  No internal carotid artery stenosis bilaterally. Vertebrals with antegrade flow bilaterally.   CXR   Low volume chest exam.  No acute process.    EKG    Physical Exam   The patient is alert and cooperative at the time of examination.  Extraocular movements are full, the patient has a relative right, Ms. visual field deficit.  The patient notes decreased sensation on the right arm and right leg, not on the right face.  The patient has a mild right hemiparesis involving arm and leg, no significant drift of the right arm was noted.  Deep tendon reflexes are symmetric.  Speech is normal, no aphasia or dysarthria is noted.  Respiratory examination is clear.  Cardiovascular examination reveals a regular rate and rhythm, no obvious murmurs or rubs are noted.  Extremities are without significant edema.  Abdomen reveals positive bowel sounds no organomegaly or tenderness is noted.    ASSESSMENT [name] is a 46 y.o. male with no acute stroke per MRI in setting of opiates, cocaine, benzos and THC use. Likely etiology of symtpoms is vasospasm from cocaine use. On aspirin 325 mg orally every day for secondary stroke prevention.  -leukocytosis 11.2 -dyslipidemia with LDL 100, added statin -substance abuse: cocaine, opiates, benzo, THC (SW consulted for counseling) -cigarette smoker  Suspect vasospasm as an etiology of the current deficits. The history of drug abuse was discussed with the patient. Cocaine is likely etiology of the vasospasm and a strokelike symptoms. Cessation of use of illicit drugs was recommended.  Hospital day # 2  TREATMENT/PLAN -Continue aspirin 81 mg orally every day for secondary stroke prevention.  - follow up hypercoagulable and  vasculitis work up -cancel TEE -OP PT and OT -discharge home with mother   Derek French, ANP-BC, GNP-BC [facility] Pager: [phone] 03/30/2011 10:57 AM  Dr. Lesia Sago has personally reviewed chart, pertinent data, examined the patient and developed the plan of care.    Lesly Dukes

## 2011-03-30 NOTE — Discharge Summary (Signed)
Stroke Discharge Summary  Patient ID: Derek French   MRN: [id number]      DOB: May 14, 1965  Date of Admission: 03/28/2011 Date of Discharge: 03/30/2011  Attending Physician:  Darcella Cheshire, MD  Patient's PCP:  No primary provider on file.  Discharge Diagnoses:  Principal Problem:  *Cerebral ischemia with resultant right hemiparesis and right visual field defect secondary to vasospasm from cocaine use Active Problems:  Dyslipidemia  Leukocytosis  Substance abuse-cocaine, opiates, benzodiazepines, THC  Cigarette smoker  BMI: Body mass index is 23.35 kg/(m^2).   Past Medical History  Diagnosis Date  . Recurrent boils   . Stroke     2011 treated at [facility]  . Headache    History reviewed. No pertinent past surgical history.  Medication List  As of 03/30/2011  3:22 PM   TAKE these medications         aspirin 81 MG chewable tablet   Chew 81 mg by mouth daily.           LABORATORY STUDIES CBC    Component Value Date/Time   WBC 11.2* 03/28/2011 1320   RBC 4.70 03/28/2011 1320   HGB 15.6 03/28/2011 1336   HCT 46.0 03/28/2011 1336   PLT 252 03/28/2011 1320   MCV 91.5 03/28/2011 1320   MCH 32.1 03/28/2011 1320   MCHC 35.1 03/28/2011 1320   RDW 14.1 03/28/2011 1320   LYMPHSABS 3.5 03/28/2011 1320   MONOABS 1.4* 03/28/2011 1320   EOSABS 0.1 03/28/2011 1320   BASOSABS 0.0 03/28/2011 1320   CMP    Component Value Date/Time   NA 142 03/28/2011 1336   K 3.6 03/28/2011 1336   CL 108 03/28/2011 1336   CO2 20 03/28/2011 1320   GLUCOSE 91 03/28/2011 1336   BUN 9 03/28/2011 1336   CREATININE 0.90 03/28/2011 1336   CALCIUM 9.5 03/28/2011 1320   PROT 7.1 03/28/2011 1320   ALBUMIN 3.7 03/28/2011 1320   AST 16 03/28/2011 1320   ALT 17 03/28/2011 1320   ALKPHOS 62 03/28/2011 1320   BILITOT 0.7 03/28/2011 1320   GFRNONAA >90 03/28/2011 1320   GFRAA >90 03/28/2011 1320   COAGS Lab Results  Component Value Date   INR 1.00 03/28/2011   Lipid Panel    Component Value Date/Time   CHOL 159 03/29/2011 0445   TRIG 60 03/29/2011  0445   HDL 47 03/29/2011 0445   CHOLHDL 3.4 03/29/2011 0445   VLDL 12 03/29/2011 0445   LDLCALC 100* 03/29/2011 0445   HgbA1C  Lab Results  Component Value Date   HGBA1C 5.8* 03/29/2011   Urine Drug Screen     Component Value Date/Time   LABOPIA POSITIVE* 03/29/2011 0212   COCAINSCRNUR POSITIVE* 03/29/2011 0212   LABBENZ POSITIVE* 03/29/2011 0212   AMPHETMU NONE DETECTED 03/29/2011 0212   THCU POSITIVE* 03/29/2011 0212   LABBARB NONE DETECTED 03/29/2011 0212    Alcohol Level No results found for this basename: eth   CK TOTAL AND CKMB     Status: Abnormal   Collection Time   03/28/11  1:20 PM      Component Value Range Comment   Total CK 237 (*) 7 - 232 (U/L)    CK, MB 4.8 (*) 0.3 - 4.0 (ng/mL)    Relative Index 2.0  0.0 - 2.5    TROPONIN I     Status: Normal   Collection Time   03/28/11  1:20 PM      Component Value Range  Comment   Troponin I <0.30  <0.30 (ng/mL)   ANA     Status: Normal   Collection Time   03/29/11  1:30 PM      Component Value Range Comment   ANA NEGATIVE  NEGATIVE    C3 COMPLEMENT     Status: Normal   Collection Time   03/29/11  1:30 PM      Component Value Range Comment   C3 Complement 104  90 - 180 (mg/dL)   C4 COMPLEMENT     Status: Normal   Collection Time   03/29/11  1:30 PM      Component Value Range Comment   Complement C4, Body Fluid 20  10 - 40 (mg/dL)   ANTITHROMBIN III     Status: Normal   Collection Time   03/29/11  1:30 PM      Component Value Range Comment   AntiThromb III Func 87  75 - 120 (%)   PROTEIN C ACTIVITY     Status: Abnormal   Collection Time   03/29/11  1:30 PM      Component Value Range Comment   Protein C Activity 136 (*) 75 - 133 (%)   PROTEIN S ACTIVITY     Status: Normal   Collection Time   03/29/11  1:30 PM      Component Value Range Comment   Protein S Activity 111  69 - 129 (%)   LUPUS ANTICOAGULANT PANEL     Status: Abnormal   Collection Time   03/29/11  1:30 PM      Component Value Range Comment   PTT Lupus Anticoagulant 53.8 (*)  28.0 - 43.0 (secs)    PTTLA Confirmation 5.8  <8.0 (secs)    PTTLA 4:1 Mix 49.6 (*) 28.0 - 43.0 (secs)    Drvvt 41.2  34.1 - 42.2 (secs)    Drvvt confirmation NOT APPL  <1.16 (Ratio)    dRVVT Incubated 1:1 Mix NOT APPL  34.1 - 42.2 (secs)    Lupus Anticoagulant NOT DETECTED  NOT DETECTED    HOMOCYSTEINE, SERUM     Status: Normal   Collection Time   03/29/11  1:30 PM      Component Value Range Comment   Homocysteine-Norm 7.8  4.0 - 15.4 (umol/L)   RPR     Status: Normal   Collection Time   03/29/11  1:30 PM      Component Value Range Comment   RPR NON REACTIVE  NON REACTIVE    HIV ANTIBODY (ROUTINE TESTING)     Status: Normal   Collection Time   03/29/11  1:30 PM      Component Value Range Comment   HIV NON REACTIVE  NON REACTIVE    SEDIMENTATION RATE     Status: Normal   Collection Time   03/29/11  1:30 PM      Component Value Range Comment   Sed Rate 4  0 - 16 (mm/hr)    SIGNIFICANT DIAGNOSTIC STUDIES CT of the brain No acute intracranial finding.  MRI of the brain No acute infarct. Mild chronic microvascular ischemia in the white matter.  MRA of the brain Image quality is significantly degraded by motion. No large vessel occlusion or critical stenosis.  Cerebral angiogram 1. Angiographically, no evidence of occlusions, stenoses, dissections, intraluminal filling defects, of aneurysms or dural AV fistula. 2. Venous outflow within normal limits.  2D Echocardiogram EF 55-60% with no source of embolus.  Carotid Doppler No internal carotid artery  stenosis bilaterally. Vertebrals with antegrade flow bilaterally.  CXR Low volume chest exam. No acute process.   History of Present Illness  Derek French is an 46 y.o. male who comes in with right-sided weakness. He had a headache in am and went to sleep at 9:00 am. He woke up with symptoms of slurred speech and right-sided weakness at 12:00 pm. Came in as a stroke code 03/28/2011 at 1310. Not t-PA candidate due to last seen normal putting him out  of t-PA window. NIHSS of 7. Dr. Peggye Form from interventional radiology was called for emergency angiogram and an attempt at embolectomy if a visible thrombus was seen. History of "blood clots" in the brain per daughter. Angio normal. No intervention performed. Patient was not a TPA candidate secondary to delay in arrival. He was admitted to the neuro ICU for further evaluation and treatment.  Hospital Course:  The following morning after admission, the patient remains with right  hemiparesis and right visual field cut. It was felt the patient had a left PCA infarct. Patient was started on aspirin 325 mg daily for secondary stroke prevention as well as a statin for LDL of 100. MRI was negative for acute stroke. It is felt the patient has cerebral ischemia secondary to vasospasm from cocaine use as his urine drug screen was positive for cocaine, marijuana, opiates and benzodiazepines.   Patient with continued stroke symptoms of right hemiparesis and visual field defect the day of discharge. PT and OT recommended outpatient followup. Patient plans to discharge home with his mother for a few days. He was advised to stop her cane use as well as other illegal substances and to stop smoking.  Patient has stroke risk factors of smoking and cocaine abuse  Discharge Exam  Blood pressure 128/76, pulse 63, temperature 97.8 F (36.6 C), temperature source Oral, resp. rate 18, height 6\' 2"  (1.88 m), weight 82.5 kg (181 lb 14.1 oz), SpO2 93.00%. The patient is alert and cooperative at the time of examination. Extraocular movements are full, the patient has a relative right, Ms. visual field deficit. The patient notes decreased sensation on the right arm and right leg, not on the right face. The patient has a mild right hemiparesis involving arm and leg, no significant drift of the right arm was noted.  Deep tendon reflexes are symmetric. Speech is normal, no aphasia or dysarthria is noted. Respiratory examination is  clear. Cardiovascular examination reveals a regular rate and rhythm, no obvious murmurs or rubs are noted. Extremities are without significant edema. Abdomen reveals positive bowel sounds no organomegaly or tenderness is noted.  Discharge Diet   Cardiac thin liquids  Discharge Plan   - Disposition:  Home or Self Care with mother - aspirin 81 mg orally every day for secondary stroke prevention. - Ongoing risk factor control by Primary Care Physician. - Risk factor recommendations:  Smoking cessation, substance abuse cessation  - Follow-up primary provider  in 1 month, get one if you do not have - Follow-up with neuro if needed  Signed Annie Main, Tama Gander, [facility] Nurse Practitioner 03/30/2011, 3:22 PM  Dr. Lesia Sago has personally reviewed chart, pertinent data, examined the patient and developed the plan of care.   Lesly Dukes

## 2011-03-30 NOTE — Progress Notes (Signed)
CARE MANAGEMENT NOTE 03/30/2011 Discharge planning. Case Manager spoke with patient. Lives with his mom. Discussed outpt PT/OT, Pt will go to [facility]. Faxed paperwork.

## 2011-03-30 NOTE — Evaluation (Signed)
Occupational Therapy Evaluation Patient Details Name: Derek French MRN: [id number] DOB: 30-Mar-1965 Today's Date: 03/30/2011  Problem List: There is no problem list on file for this patient.   Past Medical History:  Past Medical History  Diagnosis Date  . Recurrent boils   . Stroke     2011 treated at [facility]  . Headache    Past Surgical History: History reviewed. No pertinent past surgical history.  OT Assessment/Plan/Recommendation OT Assessment Clinical Impression Statement: Pt presents with drecreased overall strength R UE, decreased FM control & reports of visual changes right (R field cut). He is currently requiring Min-Min guard asssit & supervision for ADL's and functional mobility during transfers. He reports that his mother has tub bench & 3:1 and plans to stay with her when d/c from hospital. Mother & pt's 33 y/o daughter to provide PRN assist per pt reports. Recommend Out pt OT and further assessment of vision. OT Recommendation/Assessment: Patient will need skilled OT in the acute care venue OT Problem List: Impaired vision/perception;Impaired balance (sitting and/or standing);Decreased knowledge of use of DME or AE;Pain;Decreased range of motion;Decreased strength;Decreased coordination;Impaired UE functional use Barriers to Discharge: None OT Therapy Diagnosis : Generalized weakness;Disturbance of vision;Acute pain OT Plan OT Frequency: Min 2X/week OT Treatment/Interventions: Self-care/ADL training;Therapeutic activities;Neuromuscular education;Therapeutic exercise;Visual/perceptual remediation/compensation;Patient/family education OT Recommendation Follow Up Recommendations: Outpatient OT Equipment Recommended: None recommended by OT OT Goals Acute Rehab OT Goals Time For Goal Achievement: 2 weeks ADL Goals Pt Will Perform Grooming: with set-up;Sitting, edge of bed;Sitting at sink ADL Goal: Grooming - Progress: Goal set today Pt Will Perform Upper Body Bathing: with  modified independence;Sitting, edge of bed;Sitting at sink ADL Goal: Upper Body Bathing - Progress: Goal set today Pt Will Perform Lower Body Bathing: with modified independence;Sitting, edge of bed;Sitting at sink;Sit to stand from chair ADL Goal: Lower Body Bathing - Progress: Goal set today Pt Will Perform Upper Body Dressing: with modified independence;Sitting, bed;Sitting, chair ADL Goal: Upper Body Dressing - Progress: Goal set today Pt Will Perform Lower Body Dressing: with modified independence;Sit to stand from chair;Sit to stand from bed ADL Goal: Lower Body Dressing - Progress: Goal set today Pt Will Transfer to Toilet: with modified independence;Ambulation;with DME;3-in-1 ADL Goal: Toilet Transfer - Progress: Goal set today Pt Will Perform Toileting - Clothing Manipulation: with modified independence;with supervision ADL Goal: Toileting - Clothing Manipulation - Progress: Goal set today Pt Will Perform Toileting - Hygiene: with modified independence;Sitting on 3-in-1 or toilet ADL Goal: Toileting - Hygiene - Progress: Goal set today Pt Will Perform Tub/Shower Transfer: with supervision;with min assist;with caregiver independent in assisting;Ambulation;with DME;Transfer tub bench ADL Goal: Tub/Shower Transfer - Progress: Goal set today Arm Goals Pt Will Perform AROM: Independently;Right upper extremity Arm Goal: AROM - Progress: Goal set today Additional Arm Goal #1: Pt will perform R UE AROM & grip/pinch w/ Mod I  Arm Goal: Additional Goal #1 - Progress: Goal set today  OT Evaluation Precautions/Restrictions  Precautions Precautions: Fall Required Braces or Orthoses: No Restrictions Weight Bearing Restrictions: No Prior Functioning Home Living Lives With: Alone Receives Help From: Family (Mother in good health, 16 yo daughter) Type of Home: House (Pt's apt on 3rd floor; will stay with mother one-level house) Home Layout: One level Bathroom Shower/Tub: Teacher, music: Standard Home Adaptive Equipment: Bedside commode/3-in-1;Tub transfer bench Prior Function Level of Independence: Independent with basic ADLs;Independent with homemaking with ambulation;Independent with gait Vocation: Full time employment ADL ADL Eating/Feeding: NPO Grooming: Simulated;Supervision/safety;Set up Where Assessed -  Grooming: Sitting, chair Upper Body Bathing: Simulated;Supervision/safety;Set up Where Assessed - Upper Body Bathing: Sitting, chair Lower Body Bathing: Simulated;Set up;Supervision/safety Where Assessed - Lower Body Bathing: Sitting, chair Upper Body Dressing: Simulated;Set up Where Assessed - Upper Body Dressing: Sitting, chair Lower Body Dressing: Simulated;Set up;Supervision/safety Where Assessed - Lower Body Dressing: Sit to stand from chair;Sitting, chair;Unsupported Toilet Transfer: Performed;Supervision/safety;Minimal assistance Toilet Transfer Details (indicate cue type and reason): Min guard assist & VC's to lift R LE (toes up) and hand placement; pt holding onto IV pole.  Toilet Transfer Method: Proofreader: Raised toilet seat with arms (or 3-in-1 over toilet) Toileting - Clothing Manipulation: Performed;Supervision/safety Where Assessed - Toileting Clothing Manipulation: Sit to stand from 3-in-1 or toilet;Standing Toileting - Hygiene: Simulated;Modified independent Where Assessed - Toileting Hygiene: Sit on 3-in-1 or toilet Tub/Shower Transfer: Not assessed Tub/Shower Transfer Equipment: Other (comment) (Pt reports Mother has tub bench, 3:1 @ home) Equipment Used: Other (comment) Ambulation Related to ADLs: pt holding onto IV pole w/ Left hand ADL Comments: Pt presents with drecreased overall strength R UE, decreased FM control & reports of visual changes right (R field cut). He is currently requiring Min-Min guard asssit & supervision for ADL's and functional mobility during transfers. He reports that  his mother has tub bench & 3:1 and plans to stay with her when d/c from hospital. Mother & pt's 61 y/o daughter to provide PRN assist per pt reports. Recommend Out pt OT  Vision/Perception  Vision - History Patient Visual Report: Blurring of vision;Peripheral vision impairment;Other (comment) (R sided/field cut per pt,compensates by turning head R) Vision - Assessment Eye Alignment: Within Functional Limits Vision Assessment: Vision impaired - to be further tested in functional context Perception Perception: Within Functional Limits Cognition Cognition Arousal/Alertness: Awake/alert Overall Cognitive Status: Appears within functional limits for tasks assessed Orientation Level: Oriented X4 Sensation/Coordination Sensation Light Touch: Impaired by gross assessment (Paresthesias R hand digits 1-5 noted) Stereognosis: Not tested Hot/Cold: Not tested Proprioception: Not tested Additional Comments: Pt reports that his entire right side feels "asleep...pins and needles" Coordination Gross Motor Movements are Fluid and Coordinated: No Fine Motor Movements are Fluid and Coordinated: No Coordination and Movement Description: RUE w/ Gross AROM for fisting and extension but significantly decreased grip strength and overall RUE strength impairments; requires assist with RLE due to weakness. Extremity Assessment RUE Assessment RUE Assessment: Exceptions to [facility] RUE AROM (degrees) Overall AROM Right Upper Extremity: Other (comment) (Gross AROM WFL's, moves slowly, impaired) RUE Strength RUE Overall Strength: Deficits;Other (Comment) (grossly 3-/5) LUE Assessment LUE Assessment: Within Functional Limits Mobility  Bed Mobility Bed Mobility: Yes Rolling Right: 5: Supervision Right Sidelying to Sit: 5: Supervision;HOB flat Sitting - Scoot to Edge of Bed: 5: Supervision Sit to Supine: 5: Supervision;HOB flat Transfers Sit to Stand: 4: Min assist;From bed;With upper extremity assist;5:  Supervision Stand to Sit: To chair/3-in-1   End of Session OT - End of Session Equipment Utilized During Treatment: Gait belt;Other (comment) (Pt amb w/ IV pole LUE, VC's for RLE) Activity Tolerance: Patient tolerated treatment well Patient left: in chair;with call bell in reach Nurse Communication: Other (comment) (Pt w/ c/o headache and requests pain meds) General Behavior During Session: [facility] for tasks performed Cognition: [facility] for tasks performed   Alm Bustard 03/30/2011, 12:21 PM

## 2011-03-30 NOTE — Progress Notes (Addendum)
Physical Therapy Treatment Patient Details Name: Derek French MRN: [id number] DOB: 1965-07-12 Today's Date: 03/30/2011  PT Assessment/Plan  PT - Assessment/Plan Comments on Treatment Session: Pt progressing with PT goals.  Increased ambulation distance today using IV pole for Lt. UE support.  Pt states that he is scared & nervous about his situation because his father just recently passed away after having a stroke.  Pt also reports visual changes in Rt eye & that he can not see to Rt side unless he turns his head.   PT Plan: Discharge plan remains appropriate PT Frequency: Min 4X/week Follow Up Recommendations: Outpatient PT Equipment Recommended: None recommended by PT PT Goals  Acute Rehab PT Goals PT Goal: Supine/Side to Sit - Progress: Met PT Goal: Sit to Stand - Progress: Progressing toward goal PT Goal: Stand to Sit - Progress: Progressing toward goal PT Goal: Ambulate - Progress: Progressing toward goal  PT Treatment Precautions/Restrictions  Precautions Precautions: Fall Required Braces or Orthoses: No Restrictions Weight Bearing Restrictions: No Mobility (including Balance) Bed Mobility Bed Mobility: Yes Rolling Right: 5: Supervision Right Sidelying to Sit: 6: Modified independent (Device/Increase time);HOB flat Sitting - Scoot to Edge of Bed: 5: Supervision Sitting - Scoot to [place] of Bed Details (indicate cue type and reason): Increased time & cues for increased use of Rt. UE to assist with scooting.  Pt states "im scared to use it".   Sit to Supine: 5: Supervision;HOB flat Transfers Sit to Stand: Other (comment);From bed;With upper extremity assist;From chair/3-in-1;With armrests (Min Guard (A)) Sit to Stand Details (indicate cue type and reason): performed 4x's.  Cues for hand placement & increased use of Rt. UE & to push through bil legs evenly.   Stand to Sit: Other (comment) (Min Guard (A)) Stand to Sit Details: cues for hand placement & use of UE's to control  descent.   Ambulation/Gait Ambulation/Gait Assistance: 4: Min assist Ambulation/Gait Assistance Details (indicate cue type and reason): Min Assist for balance.  Pt pushed IV pole holding on with Lt. hand.  Cues for increaed Rt. hip/knee flexion & Rt. foot dorsiflexion- improved with increased distance.     Ambulation Distance (Feet): 30 Feet Assistive device: Other (Comment) (IV pole Lt side. ) Gait Pattern: Decreased stride length;Step-through pattern;Decreased hip/knee flexion - right;Decreased dorsiflexion - right;Decreased weight shift to right;Decreased weight shift to left Stairs: No Wheelchair Mobility Wheelchair Mobility: No  Posture/Postural Control Posture/Postural Control: No significant limitations Static Standing Balance Static Standing - Balance Support: During functional activity Static Standing - Level of Assistance: 5: Stand by assistance Static Standing - Comment/# of Minutes: approx 3-4 minutes standing at sink washing face & brushing teeth.   Exercise  General Exercises - Lower Extremity Long Arc Quad: AAROM;Strengthening;Right;10 reps;Seated Hip Flexion/Marching: AAROM;Strengthening;Right;Seated End of Session PT - End of Session Equipment Utilized During Treatment: Gait belt Activity Tolerance: Patient tolerated treatment well Patient left: in chair;with call bell in reach Nurse Communication: Mobility status for ambulation General Behavior During Session: [facility] for tasks performed Cognition: [facility] for tasks performed  Lara Mulch 03/30/2011, 1:04 PM [phone]

## 2011-03-30 NOTE — Progress Notes (Signed)
Clinical Social Worker reconsult for "Current Substance Abuse." CSW met with pt to address consult. Pt shared that he had a relapse after 7 years of soberity. Pt reported the his relapse influenced his hospitalization. Pt reported that he is aware of NA groups in the area and plans to start utilizing his support system to assist him in his recovery. CSW completed SBIRT. CSW provided psychoeducation on substance use and abuse. CSW provided support and education around social factors that has influenced his substance use. CSW provided resources. CSW is signing off as pt is discharging home.   Dede Query, MSW, Theresia Majors [phone]

## 2011-03-31 LAB — PROTEIN S, TOTAL: Protein S Ag, Total: 136 % (ref 60–150)

## 2011-03-31 LAB — CARDIOLIPIN ANTIBODIES, IGG, IGM, IGA: Anticardiolipin IgA: 7 APL U/mL — ABNORMAL LOW (ref <22)

## 2011-09-17 ENCOUNTER — Encounter (HOSPITAL_COMMUNITY): Payer: Self-pay | Admitting: Emergency Medicine

## 2011-09-17 ENCOUNTER — Emergency Department (HOSPITAL_COMMUNITY)
Admission: EM | Admit: 2011-09-17 | Discharge: 2011-09-17 | Disposition: A | Discharge status: Home / Self Care | Payer: Self-pay | Attending: Emergency Medicine | Admitting: Emergency Medicine

## 2011-09-17 DIAGNOSIS — H5789 Other specified disorders of eye and adnexa: Secondary | ICD-10-CM | POA: Insufficient documentation

## 2011-09-17 DIAGNOSIS — Z8673 Personal history of transient ischemic attack (TIA), and cerebral infarction without residual deficits: Secondary | ICD-10-CM | POA: Insufficient documentation

## 2011-09-17 DIAGNOSIS — R51 Headache: Secondary | ICD-10-CM | POA: Insufficient documentation

## 2011-09-17 DIAGNOSIS — F17200 Nicotine dependence, unspecified, uncomplicated: Secondary | ICD-10-CM | POA: Insufficient documentation

## 2011-09-17 DIAGNOSIS — H109 Unspecified conjunctivitis: Secondary | ICD-10-CM

## 2011-09-17 MED ORDER — TOBRAMYCIN-DEXAMETHASONE 0.3-0.1 % OP SUSP
2.0000 [drp] | Freq: Four times a day (QID) | OPHTHALMIC | Status: DC
  Administered 2011-09-17: 2 [drp] via OPHTHALMIC
  Filled 2011-09-17: qty 2.5

## 2011-09-17 MED ORDER — IBUPROFEN 800 MG PO TABS: 800.0000 mg | ORAL_TABLET | Freq: Three times a day (TID) | ORAL | Status: DC

## 2011-09-17 MED ORDER — KETOROLAC TROMETHAMINE 60 MG/2ML IM SOLN
60.0000 mg | Freq: Once | INTRAMUSCULAR | Status: AC
  Administered 2011-09-17: 60 mg via INTRAMUSCULAR
  Filled 2011-09-17: qty 2

## 2011-09-17 MED ORDER — METHOCARBAMOL 500 MG PO TABS
500.0000 mg | ORAL_TABLET | Freq: Once | ORAL | Status: AC
  Administered 2011-09-17: 500 mg via ORAL
  Filled 2011-09-17: qty 1

## 2011-09-17 NOTE — ED Provider Notes (Signed)
History     CSN: [id number]  Arrival date & time 09/17/11  2023   First MD Initiated Contact with Patient 09/17/11 2050      9:34 PM HPI Patient reports right eye redness and drainage since 3 PM this afternoon. Reports symptoms associated with a throbbing headache behind his right eye. Denies eye. Denies fever, neck pain, change in vision. Reports having tried Goody powders with mild improvement.Patient is a 46 y.o. male presenting with eye problem. The history is provided by the patient.  Eye Problem  This is a new problem. The current episode started 6 to 12 hours ago. The problem occurs constantly. The problem has been gradually worsening. There is pain in the right eye. There was no injury mechanism. The pain is moderate. There is no history of trauma to the eye. He does not wear contacts. Associated symptoms include blurred vision, discharge, photophobia and eye redness. Pertinent negatives include no numbness, no double vision, no foreign body sensation, no nausea, no vomiting, no tingling, no weakness and no itching. He has tried nothing for the symptoms.    Past Medical History  Diagnosis Date  . Recurrent boils   . Stroke     2011 treated at [facility]  . Headache     History reviewed. No pertinent past surgical history.  No family history on file.  History  Substance Use Topics  . Smoking status: Current Everyday Smoker -- 1.0 packs/day    Types: Cigarettes  . Smokeless tobacco: Not on file  . Alcohol Use: Yes      Review of Systems  Constitutional: Negative for fever, chills and diaphoresis.  HENT: Negative for ear pain, congestion, sore throat, rhinorrhea, neck pain, neck stiffness and sinus pressure.   Eyes: Positive for blurred vision, photophobia, discharge and redness. Negative for double vision and itching.  Respiratory: Negative for shortness of breath.   Cardiovascular: Negative for chest pain.  Gastrointestinal: Negative for nausea and vomiting.  Skin:  Negative for itching.  Neurological: Positive for headaches. Negative for dizziness, tingling, seizures, syncope, facial asymmetry, speech difficulty, weakness, light-headedness and numbness.  All other systems reviewed and are negative.    Allergies  Codeine  Home Medications   Current Outpatient Rx  Name Route Sig Dispense Refill  . ASPIRIN 81 MG PO CHEW Oral Chew 81 mg by mouth daily.        BP 145/77  Pulse 76  Temp 98.6 F (37 C) (Oral)  Resp 12  SpO2 97%  Physical Exam  Constitutional: He is oriented to person, place, and time. He appears well-developed and well-nourished. No distress.  HENT:  Head: Normocephalic and atraumatic.  Right Ear: External ear normal.  Left Ear: External ear normal.  Mouth/Throat: Oropharynx is clear and moist. No oropharyngeal exudate.  Eyes: EOM are normal. Pupils are equal, round, and reactive to light. Right eye exhibits discharge. Right conjunctiva is injected. Right conjunctiva has no hemorrhage. Right eye exhibits normal extraocular motion and no nystagmus.  Fundoscopic exam:      The right eye shows no hemorrhage and no papilledema.  Slit lamp exam:      The right eye shows no corneal abrasion and no corneal flare.       IOP in right eye is 11.   Visual Acuity  -  Bilateral Near:  20/200 ;  R Near:  20/400 ;  L Near:  20/200  Neck: Normal range of motion. Neck supple.  Cardiovascular: Normal rate, regular rhythm and normal  heart sounds.   Pulmonary/Chest: Effort normal and breath sounds normal.  Abdominal: Soft. Bowel sounds are normal.  Neurological: He is alert and oriented to person, place, and time.  Skin: Skin is warm and dry. No rash noted. No erythema. No pallor.  Psychiatric: He has a normal mood and affect. His behavior is normal.    ED Course  Procedures   MDM     Provided patient with tobramycin drops. Ice anti-inflammatory medications for headache. Advised followup with ophthalmology if no improvement to 3 days  after beginning tobramycin for conjunctivitis.     Thomasene Lot, PA-C 09/17/11 2247

## 2011-09-17 NOTE — ED Provider Notes (Signed)
Medical screening examination/treatment/procedure(s) were performed by non-physician practitioner and as supervising physician I was immediately available for consultation/collaboration.    Nelia Shi, MD 09/17/11 2248

## 2011-09-17 NOTE — ED Notes (Signed)
C/o R eye redness and draining and headache since 3pm today.

## 2011-09-26 ENCOUNTER — Encounter (HOSPITAL_COMMUNITY): Payer: Self-pay | Admitting: *Deleted

## 2011-09-26 ENCOUNTER — Emergency Department (HOSPITAL_COMMUNITY): Payer: Self-pay

## 2011-09-26 ENCOUNTER — Inpatient Hospital Stay (HOSPITAL_COMMUNITY): Payer: Self-pay

## 2011-09-26 ENCOUNTER — Inpatient Hospital Stay (HOSPITAL_COMMUNITY)
Admission: EM | Admit: 2011-09-26 | Discharge: 2011-09-28 | DRG: 918 | Disposition: A | Discharge status: Home / Self Care | Payer: 59 | Attending: Neurology | Admitting: Neurology

## 2011-09-26 DIAGNOSIS — T405X1A Poisoning by cocaine, accidental (unintentional), initial encounter: Principal | ICD-10-CM | POA: Diagnosis present

## 2011-09-26 DIAGNOSIS — E785 Hyperlipidemia, unspecified: Secondary | ICD-10-CM | POA: Diagnosis present

## 2011-09-26 DIAGNOSIS — R471 Dysarthria and anarthria: Secondary | ICD-10-CM | POA: Diagnosis present

## 2011-09-26 DIAGNOSIS — T43601A Poisoning by unspecified psychostimulants, accidental (unintentional), initial encounter: Secondary | ICD-10-CM | POA: Diagnosis present

## 2011-09-26 DIAGNOSIS — I6340 Cerebral infarction due to embolism of unspecified cerebral artery: Secondary | ICD-10-CM

## 2011-09-26 DIAGNOSIS — F17200 Nicotine dependence, unspecified, uncomplicated: Secondary | ICD-10-CM | POA: Diagnosis present

## 2011-09-26 DIAGNOSIS — I639 Cerebral infarction, unspecified: Secondary | ICD-10-CM

## 2011-09-26 DIAGNOSIS — H9319 Tinnitus, unspecified ear: Secondary | ICD-10-CM

## 2011-09-26 DIAGNOSIS — F17210 Nicotine dependence, cigarettes, uncomplicated: Secondary | ICD-10-CM | POA: Diagnosis present

## 2011-09-26 DIAGNOSIS — Z86718 Personal history of other venous thrombosis and embolism: Secondary | ICD-10-CM

## 2011-09-26 DIAGNOSIS — I669 Occlusion and stenosis of unspecified cerebral artery: Secondary | ICD-10-CM | POA: Diagnosis present

## 2011-09-26 DIAGNOSIS — G8190 Hemiplegia, unspecified affecting unspecified side: Secondary | ICD-10-CM | POA: Diagnosis present

## 2011-09-26 DIAGNOSIS — F1410 Cocaine abuse, uncomplicated: Secondary | ICD-10-CM | POA: Diagnosis present

## 2011-09-26 DIAGNOSIS — R4789 Other speech disturbances: Secondary | ICD-10-CM | POA: Diagnosis present

## 2011-09-26 DIAGNOSIS — I6350 Cerebral infarction due to unspecified occlusion or stenosis of unspecified cerebral artery: Secondary | ICD-10-CM

## 2011-09-26 DIAGNOSIS — I6782 Cerebral ischemia: Secondary | ICD-10-CM | POA: Diagnosis present

## 2011-09-26 DIAGNOSIS — Z8673 Personal history of transient ischemic attack (TIA), and cerebral infarction without residual deficits: Secondary | ICD-10-CM

## 2011-09-26 DIAGNOSIS — F1910 Other psychoactive substance abuse, uncomplicated: Secondary | ICD-10-CM

## 2011-09-26 LAB — COMPREHENSIVE METABOLIC PANEL WITH GFR
ALT: 14 U/L (ref 0–53)
AST: 15 U/L (ref 0–37)
Albumin: 3.7 g/dL (ref 3.5–5.2)
Alkaline Phosphatase: 64 U/L (ref 39–117)
BUN: 11 mg/dL (ref 6–23)
CO2: 22 meq/L (ref 19–32)
Calcium: 9.4 mg/dL (ref 8.4–10.5)
Chloride: 105 meq/L (ref 96–112)
Creatinine, Ser: 0.95 mg/dL (ref 0.50–1.35)
GFR calc Af Amer: >90 mL/min
GFR calc non Af Amer: >90 mL/min
Glucose, Bld: 103 mg/dL — ABNORMAL HIGH (ref 70–99)
Potassium: 3.8 meq/L (ref 3.5–5.1)
Sodium: 138 meq/L (ref 135–145)
Total Bilirubin: 0.6 mg/dL (ref 0.3–1.2)
Total Protein: 7.2 g/dL (ref 6.0–8.3)

## 2011-09-26 LAB — DIFFERENTIAL
Basophils Absolute: 0 K/uL (ref 0.0–0.1)
Basophils Relative: 0 % (ref 0–1)
Eosinophils Absolute: 0.1 K/uL (ref 0.0–0.7)
Eosinophils Relative: 1 % (ref 0–5)
Lymphocytes Relative: 25 % (ref 12–46)
Lymphs Abs: 2.3 K/uL (ref 0.7–4.0)
Monocytes Absolute: 1.1 K/uL — ABNORMAL HIGH (ref 0.1–1.0)
Monocytes Relative: 12 % (ref 3–12)
Neutro Abs: 5.6 K/uL (ref 1.7–7.7)
Neutrophils Relative %: 62 % (ref 43–77)

## 2011-09-26 LAB — GLUCOSE, CAPILLARY
Glucose-Capillary: 100 mg/dL — ABNORMAL HIGH (ref 70–99)
Glucose-Capillary: 117 mg/dL — ABNORMAL HIGH (ref 70–99)

## 2011-09-26 LAB — CBC
HCT: 46.6 % (ref 39.0–52.0)
Hemoglobin: 16.3 g/dL (ref 13.0–17.0)
MCH: 32.5 pg (ref 26.0–34.0)
MCHC: 35 g/dL (ref 30.0–36.0)
MCV: 93 fL (ref 78.0–100.0)
Platelets: 277 K/uL (ref 150–400)
RBC: 5.01 MIL/uL (ref 4.22–5.81)
RDW: 14 % (ref 11.5–15.5)
WBC: 9 K/uL (ref 4.0–10.5)

## 2011-09-26 LAB — APTT: aPTT: 37 s (ref 24–37)

## 2011-09-26 LAB — POCT I-STAT, CHEM 8
BUN: 10 mg/dL (ref 6–23)
Chloride: 109 mEq/L (ref 96–112)
Creatinine, Ser: 0.9 mg/dL (ref 0.50–1.35)
Sodium: 140 mEq/L (ref 135–145)
TCO2: 19 mmol/L (ref 0–100)

## 2011-09-26 LAB — RAPID URINE DRUG SCREEN, HOSP PERFORMED
Barbiturates: NOT DETECTED
Benzodiazepines: NOT DETECTED

## 2011-09-26 LAB — TROPONIN I: Troponin I: <0.3 ng/mL

## 2011-09-26 LAB — CK TOTAL AND CKMB (NOT AT ARMC): Total CK: 256 U/L — ABNORMAL HIGH (ref 7–232)

## 2011-09-26 LAB — MRSA PCR SCREENING: MRSA by PCR: NEGATIVE

## 2011-09-26 MED ORDER — ACETAMINOPHEN 325 MG PO TABS
650.0000 mg | ORAL_TABLET | ORAL | Status: DC | PRN
  Administered 2011-09-26 – 2011-09-28 (×3): 650 mg via ORAL
  Filled 2011-09-26 (×3): qty 2

## 2011-09-26 MED ORDER — TRAMADOL HCL 50 MG PO TABS
50.0000 mg | ORAL_TABLET | Freq: Three times a day (TID) | ORAL | Status: DC | PRN
  Administered 2011-09-26 – 2011-09-28 (×5): 50 mg via ORAL
  Filled 2011-09-26 (×5): qty 1

## 2011-09-26 MED ORDER — ALTEPLASE (STROKE) FULL DOSE INFUSION
0.9000 mg/kg | Freq: Once | INTRAVENOUS | Status: AC
  Administered 2011-09-26: 76 mg via INTRAVENOUS
  Filled 2011-09-26: qty 76

## 2011-09-26 MED ORDER — SENNOSIDES-DOCUSATE SODIUM 8.6-50 MG PO TABS
1.0000 | ORAL_TABLET | Freq: Every evening | ORAL | Status: DC | PRN
  Filled 2011-09-26: qty 1

## 2011-09-26 MED ORDER — ONDANSETRON HCL 4 MG/2ML IJ SOLN: 4.0000 mg | Freq: Four times a day (QID) | INTRAMUSCULAR | Status: DC | PRN

## 2011-09-26 MED ORDER — PANTOPRAZOLE SODIUM 40 MG IV SOLR
40.0000 mg | Freq: Every day | INTRAVENOUS | Status: DC
  Administered 2011-09-26: 40 mg via INTRAVENOUS
  Filled 2011-09-26 (×2): qty 40

## 2011-09-26 MED ORDER — LABETALOL HCL 5 MG/ML IV SOLN: 10.0000 mg | INTRAVENOUS | Status: DC | PRN

## 2011-09-26 MED ORDER — SODIUM CHLORIDE 0.9 % IV SOLN: 250.0000 mL | Freq: Once | INTRAVENOUS | Status: DC

## 2011-09-26 MED ORDER — ACETAMINOPHEN 650 MG RE SUPP: 650.0000 mg | RECTAL | Status: DC | PRN

## 2011-09-26 MED ORDER — SODIUM CHLORIDE 0.9 % IV SOLN
INTRAVENOUS | Status: DC
  Administered 2011-09-26 – 2011-09-27 (×2): via INTRAVENOUS

## 2011-09-26 NOTE — ED Notes (Signed)
Pt reports having a severe headache for several days, reports waking up this am with a horrible ringing in his right ear. Went to work this am and it became worse, then at approx 0730 began having slurred speech and "feeling drunk and dizzy." at triage, facial droop noted, slurred speech, mild right arm drift noted and right grip weaker, denies problems with ambulating.

## 2011-09-26 NOTE — ED Provider Notes (Signed)
History     CSN: [id number]  Arrival date & time 09/26/11  1191   First MD Initiated Contact with Patient 09/26/11 [id number]      Chief Complaint  Patient presents with  . Aphasia  . Weakness  . Code Stroke    (Consider location/radiation/quality/duration/timing/severity/associated sxs/prior treatment) HPI Comments: The patient's mother gives his history. He has had headache on and off for about 3 days. Today he went to work, had trouble with his speech, and weakness in his right side starting around 7:30 AM. His coworkers called his mother and sheet, and found him and brought him to the hospital. He has a prior history of stroke, treated in [place] 2 or 3 years ago. He is not currently on any type of medication.  Patient is a 46 y.o. male presenting with weakness. The history is provided by the patient and a relative.  Weakness The primary symptoms include headaches, focal weakness and speech change. Primary symptoms do not include fever. The symptoms began 1 to 2 hours ago. Episode duration: He has had difficulty with speech, right-sided weakness since 7:30 AM per his mother. The symptoms are unchanged. The neurological symptoms are focal. Context: Nothing.  The headache began more than 2 days ago. The headache developed gradually. Headache is a new problem. The headache is present rarely. The pain from the headache is at a severity of 10/10. Location/region(s) of the headache: right unilateral. The headache is associated with nothing. The headache is associated with weakness.  Additional symptoms include weakness, pain and tinnitus. Medical issues also include cerebral vascular accident.    Past Medical History  Diagnosis Date  . Recurrent boils   . Stroke     2011 treated at [facility]  . Headache     History reviewed. No pertinent past surgical history.  History reviewed. No pertinent family history.  History  Substance Use Topics  . Smoking status: Current Everyday  Smoker -- 1.0 packs/day    Types: Cigarettes  . Smokeless tobacco: Not on file  . Alcohol Use: Yes      Review of Systems  Constitutional: Negative for fever and chills.  HENT: Positive for tinnitus.   Eyes: Negative.   Respiratory: Negative.   Cardiovascular: Negative.   Gastrointestinal: Negative.   Genitourinary: Negative.   Musculoskeletal: Negative.   Skin: Negative.   Neurological: Positive for speech change, focal weakness, weakness and headaches.  Psychiatric/Behavioral: Negative.     Allergies  Codeine  Home Medications   Current Outpatient Rx  Name Route Sig Dispense Refill  . ASPIRIN 81 MG PO CHEW Oral Chew 81 mg by mouth daily.      . IBUPROFEN 800 MG PO TABS Oral Take 1 tablet (800 mg total) by mouth 3 (three) times daily. 21 tablet 0    BP 137/90  Pulse 72  Temp 98.1 F (36.7 C) (Oral)  Resp 20  SpO2 100%  Physical Exam  Constitutional: He is oriented to person, place, and time. He appears well-developed and well-nourished.       Patient is anxious, has slurred speech and right-sided weakness.  HENT:  Head: Normocephalic and atraumatic.  Right Ear: External ear normal.  Left Ear: External ear normal.  Mouth/Throat: Oropharynx is clear and moist.  Eyes: Conjunctivae and EOM are normal. Pupils are equal, round, and reactive to light.  Neck: Normal range of motion. Neck supple.       No carotid bruit  Cardiovascular: Normal rate, regular rhythm and normal heart  sounds.   Pulmonary/Chest: Effort normal and breath sounds normal.  Abdominal: Soft. Bowel sounds are normal.  Musculoskeletal: Normal range of motion. He exhibits no edema.  Lymphadenopathy:    He has no cervical adenopathy.  Neurological: He is alert and oriented to person, place, and time.       He has right facial weakness as well as right arm and right leg weakness.  Skin: Skin is warm and dry.  Psychiatric: He has a normal mood and affect. His behavior is normal.    ED Course    Procedures (including critical care time)  Labs Reviewed  GLUCOSE, CAPILLARY - Abnormal; Notable for the following:    Glucose-Capillary 117 (*)     All other components within normal limits  PROTIME-INR  APTT  CBC  DIFFERENTIAL  COMPREHENSIVE METABOLIC PANEL  CK TOTAL AND CKMB  TROPONIN I  URINE RAPID DRUG SCREEN (HOSP PERFORMED)   9:07 AM Patient was seen and had physical examination. His findings suggest stroke. He has a prior history of a stroke 2 or 3 years ago treated in [facility]. Laboratory and x-ray testing for stroke was ordered.  Code Stroke was called.  9:42 AM  Date: 09/26/2011  Rate:69  Rhythm: normal sinus rhythm  QRS Axis: normal  Intervals: normal QRS:  Left ventricular hypertrophy  ST/T Wave abnormalities: normal  Conduction Disutrbances:none  Narrative Interpretation: Abnormal EKG  Old EKG Reviewed: unchanged  10:02 AM Pt seen by Thana Farr, M.D., neurologist, who is planning to give pt TPA and admit pt.    1. Stroke           Carleene Cooper III, MD 09/26/11 1003

## 2011-09-26 NOTE — Evaluation (Signed)
Speech Language Pathology Evaluation Patient Details Name: Derek French MRN: [id number] DOB: 01-23-1966 Today's Date: 09/26/2011 Time: 1400-1430 SLP Time Calculation (min): 30 min  Problem List:  Patient Active Problem List  Diagnosis  . CVA (cerebral infarction)  . Dyslipidemia  . Leukocytosis  . Substance abuse  . Cigarette smoker  . Cerebral embolism with cerebral infarction  . Hemiplegia, unspecified, affecting nondominant side  . Tinnitus   Past Medical History:  Past Medical History  Diagnosis Date  . Recurrent boils   . Stroke     2011 treated at [facility]  . Headache   . Cerebral embolism with cerebral infarction 09/26/2011   Past Surgical History: History reviewed. No pertinent past surgical history. HPI:  Derek French is an 46 y.o. male who awakened normal today.  Was dropped off at work by his mother at 6AM and she reports that at that time he was normal.  Was noted by co-workers to be staggering and not speaking normally.  Symptoms further developed and co-workers called the patient's mother who brought him in by private vehicle.  At the time of presentation was also complaining of right sided numbness and weakness and slurred speech.  Patient referred for Cognitive Linguistic Evaluation per Stroke Protocol.    Assessment / Plan / Recommendation Clinical Impression  Cognitive -Lingusitic skills judged to be baseline with no deficits noted. No further ST indicated.  ST to sign off.     SLP Assessment  Patient does not need any further Speech Lanaguage Pathology Services    Follow Up Recommendations  None              SLP Evaluation Prior Functioning  Cognitive/Linguistic Baseline: Information not available Available Help at Discharge: Family Education: 12 th grade education  Vocation: Full time employment   Cognition  Overall Cognitive Status: Appears within functional limits for tasks assessed Orientation Level: Oriented X4    Comprehension  Auditory  Comprehension Overall Auditory Comprehension: Appears within functional limits for tasks assessed    Expression Expression Primary Mode of Expression: Verbal Verbal Expression Overall Verbal Expression: Appears within functional limits for tasks assessed   Oral / Motor Oral Motor/Sensory Function Overall Oral Motor/Sensory Function: Appears within functional limits for tasks assessed Motor Speech Overall Motor Speech: Appears within functional limits for tasks assessed   Moreen Fowler M.S., CCC-SLP [phone]  [facility] 09/26/2011, 6:16 PM

## 2011-09-26 NOTE — ED Notes (Signed)
NS 500 cc Bolus started

## 2011-09-26 NOTE — ED Notes (Signed)
Patient now complains of blurred vision and pain in the right ear continues he advises a ringing sensation.

## 2011-09-26 NOTE — ED Notes (Signed)
Patient transported to CT 

## 2011-09-26 NOTE — ED Notes (Signed)
TPA infusing  Has completed , No bleeding noted at this time.

## 2011-09-26 NOTE — ED Notes (Signed)
Transferred to 3104 via stretcher. Report given to RN for 3104.

## 2011-09-26 NOTE — H&P (Signed)
Admission H&P    Chief Complaint: Left sided weakness HPI: Derek French is an 46 y.o. male who awakened normal today.  Was dropped off at work by his mother at 6AM and she reports that at that time he was normal.  Was noted by co-workers to be staggering and not speaking normally.  Symptoms further developed and co-workers called the patient's mother who brought him in by private vehicle.  At the time of presentation was also complaining of right sided numbness and weakness and slurred speech.    LSN: [id number] tPA Given: Yes  Past Medical History  Diagnosis Date  . Recurrent boils   . Stroke     2011 treated at [facility]  . Headache   . Cerebral embolism with cerebral infarction 09/26/2011    History reviewed. No pertinent past surgical history.  History reviewed. No pertinent family history.  Social History:  reports that he has been smoking Cigarettes.  He has been smoking about 1 pack per day. He does not have any smokeless tobacco history on file. He reports that he drinks alcohol. He reports that he does not use illicit drugs.  Allergies:  Allergies  Allergen Reactions  . Codeine Other (See Comments)    hallucinations    Medications: ASA  ROS: History obtained from the patient  General ROS: negative for - chills, fatigue, fever, night sweats, weight gain or weight loss Psychological ROS: negative for - behavioral disorder, hallucinations, memory difficulties, mood swings or suicidal ideation Ophthalmic ROS: negative for - blurry vision, double vision, eye pain or loss of vision ENT ROS: tinnitus  Allergy and Immunology ROS: negative for - hives or itchy/watery eyes Hematological and Lymphatic ROS: negative for - bleeding problems, bruising or swollen lymph nodes Endocrine ROS: negative for - galactorrhea, hair pattern changes, polydipsia/polyuria or temperature intolerance Respiratory ROS: negative for - cough, hemoptysis, shortness of breath or wheezing Cardiovascular ROS:  negative for - chest pain, dyspnea on exertion, edema or irregular heartbeat Gastrointestinal ROS: negative for - abdominal pain, diarrhea, hematemesis, nausea/vomiting or stool incontinence Genito-Urinary ROS: negative for - dysuria, hematuria, incontinence or urinary frequency/urgency Musculoskeletal ROS: negative for - joint swelling or muscular weakness Neurological ROS: as noted in HPI Dermatological ROS: negative for rash and skin lesion changes  Physical Examination: Blood pressure 132/88, pulse 58, temperature 98.9 F (37.2 C), temperature source Oral, resp. rate 13, height 6\' 2"  (1.88 m), weight 83.915 kg (185 lb), SpO2 100.00%.  General Examination: HEENT-  Normocephalic, no lesions, without obvious abnormality.  Normal external eye and conjunctiva.  Normal TM's bilaterally.  Normal auditory canals and external ears. Normal external nose, mucus membranes and septum.  Normal pharynx. Neck supple with no masses, nodes, nodules or enlargement. Cardiovascular - S1, S2 normal Lungs - chest clear, no wheezing, rales, normal symmetric air entry Abdomen - soft, non-tender; bowel sounds normal; no masses,  no organomegaly Extremities - no edema  Neurologic Examination: Mental Status: Alert, oriented, thought content appropriate.  Speech fluent but slurred.  Able to follow 3 step commands without difficulty. Cranial Nerves: II: Discs flat bilaterally; visual fields grossly normal, pupils equal, round, reactive to light and accommodation III,IV, VI: ptosis not present, extra-ocular motions intact bilaterally V,VII: left facial droop, facial light touch sensation decreased on the right VIII: hearing normal bilaterally IX,X: gag reflex reduced XI: trapezius strength/neck flexion strength normal bilaterally XII: tongue strength normal  Motor: Right : Upper extremity   4-/5    Left:     Upper  extremity   5/5  Lower extremity   2/5     Lower extremity   5/5 Tone and bulk:normal tone  throughout; no atrophy noted Sensory: Pinprick and light touch decreased on the right Deep Tendon Reflexes: 2+ and symmetric throughout Plantars: Right: downgoing   Left: downgoing Cerebellar: normal finger-to-noseand normal heel-to-shin test on the left   Laboratory Studies:   Basic Metabolic Panel:  Lab 09/26/11 1610 09/26/11 0915  NA 140 138  K 3.8 3.8  CL 109 105  CO2 -- 22  GLUCOSE 103* 103*  BUN 10 11  CREATININE 0.90 0.95  CALCIUM -- 9.4  MG -- --  PHOS -- --    Liver Function Tests:  Lab 09/26/11 0915  AST 15  ALT 14  ALKPHOS 64  BILITOT 0.6  PROT 7.2  ALBUMIN 3.7   No results found for this basename: LIPASE:5,AMYLASE:5 in the last 168 hours No results found for this basename: AMMONIA:3 in the last 168 hours  CBC:  Lab 09/26/11 0920 09/26/11 0915  WBC -- 9.0  NEUTROABS -- 5.6  HGB 17.0 16.3  HCT 50.0 46.6  MCV -- 93.0  PLT -- 277    Cardiac Enzymes:  Lab 09/26/11 0916  CKTOTAL 256*  CKMB 4.1*  CKMBINDEX --  TROPONINI <0.30    BNP: No components found with this basename: POCBNP:5  CBG:  Lab 09/26/11 0912 09/26/11 0852  GLUCAP 100* 117*    Microbiology: Results for orders placed during the hospital encounter of 03/28/11  MRSA PCR SCREENING     Status: Normal   Collection Time   03/28/11  4:07 PM      Component Value Range Status Comment   MRSA by PCR NEGATIVE  NEGATIVE Final     Coagulation Studies:  Basename 09/26/11 0915  LABPROT 14.1  INR 1.07    Urinalysis: No results found for this basename: COLORURINE:2,APPERANCEUR:2,LABSPEC:2,PHURINE:2,GLUCOSEU:2,HGBUR:2,BILIRUBINUR:2,KETONESUR:2,PROTEINUR:2,UROBILINOGEN:2,NITRITE:2,LEUKOCYTESUR:2 in the last 168 hours  Lipid Panel:     Component Value Date/Time   CHOL 159 03/29/2011 0445   TRIG 60 03/29/2011 0445   HDL 47 03/29/2011 0445   CHOLHDL 3.4 03/29/2011 0445   VLDL 12 03/29/2011 0445   LDLCALC 100* 03/29/2011 0445    HgbA1C:  Lab Results  Component Value Date   HGBA1C 5.8*  03/29/2011    Urine Drug Screen:     Component Value Date/Time   LABOPIA POSITIVE* 03/29/2011 0212   COCAINSCRNUR POSITIVE* 03/29/2011 0212   LABBENZ POSITIVE* 03/29/2011 0212   AMPHETMU NONE DETECTED 03/29/2011 0212   THCU POSITIVE* 03/29/2011 0212   LABBARB NONE DETECTED 03/29/2011 0212    Alcohol Level: No results found for this basename: ETH:2 in the last 168 hours  Other results: EKG: normal EKG, normal sinus rhythm, unchanged from previous tracings, normal sinus rhythm.  Imaging: Ct Head Wo Contrast  09/26/2011  *RADIOLOGY REPORT*  Clinical Data: aphasia and weakness.  CT HEAD WITHOUT CONTRAST  Technique:  Contiguous axial images were obtained from the base of the skull through the vertex without contrast.  Comparison: 03/29/2011.  Findings: There is diffuse patchy low density throughout the subcortical and periventricular white matter consistent with chronic small vessel ischemic change.  There is prominence of the sulci and ventricles consistent with brain atrophy.  There is no evidence for acute brain infarct, hemorrhage or mass.  The paranasal sinuses and mastoid air cells are clear.  The skull appears normal.  IMPRESSION:  1.  No acute intracranial abnormalities. 2.  Atrophy and chronic microvascular disease as  before.  Original Report Authenticated By: Rosealee Albee, M.D.    Assessment: 46 y.o. male presenting with slurred speech, right tinnitus, right sided weakness/numbness of acute onset.  CT unremarkable for hemorrhage.  Takes an ASA a day at home.  Although he denies illicit drug use patient has a positive urine drug screen for cocaine, marijuana and opiates.  Patient within 4.5 hour window without contraindications.  BP controlled.  Risks and benefits of tPA treatment discussed with patient and verbal consent obtained.  tPA was administered per protocol.    Stroke Risk Factors - smoking and cocaine use  Plan: 1. HgbA1c, fasting lipid panel 2. MRI, MRA  of the brain without  contrast 3. PT consult, OT consult, Speech consult 4. Echocardiogram 5. Carotid dopplers 6. Prophylactic therapy-None at this time.  Will likely benefit from Plavix as long term prophylaxis if patient has been taking his ASA at home regularly.   7. Counseling for smoking cessation and drug abuse. 8. Telemetry monitoring 9. Frequent neuro checks 10. Repeat head CT without contrast in 24 hours 11.  ICU admission  This patient is critically ill and at significant risk of neurological worsening, death and care requires constant monitoring of vital signs, hemodynamics,respiratory and cardiac monitoring, neurological assessment, discussion with family, other specialists and medical decision making of high complexity. I spent 90 minutes of neurocritical care time  in the care of  this patient.  Thana Farr, MD [facility] [phone] 09/26/2011  10:42 AM     Thana Farr, MD [facility] [phone] 09/26/2011, 10:13 AM

## 2011-09-26 NOTE — ED Notes (Signed)
NS Bolus infusing at 250 cc/hr

## 2011-09-26 NOTE — ED Notes (Signed)
Height 6'2 Weight 185 lbs stated

## 2011-09-26 NOTE — ED Notes (Signed)
cbg reads 117

## 2011-09-26 NOTE — ED Notes (Signed)
CBG was 100 when I checked it.9:13 am JG

## 2011-09-26 NOTE — Progress Notes (Signed)
*  PRELIMINARY RESULTS* Vascular Ultrasound Carotid Duplex (Doppler) has been completed.  Preliminary findings: Bilaterally no significant ICA stenosis with antegrade vertebral flow.  Farrel Demark, RDMS, RVT  09/26/2011, 3:30 PM

## 2011-09-26 NOTE — ED Notes (Signed)
No bleeding from IV sites. Patient state he can taste blood, no bleeding from tongue or teeth noted.

## 2011-09-27 ENCOUNTER — Inpatient Hospital Stay (HOSPITAL_COMMUNITY): Payer: Self-pay

## 2011-09-27 LAB — LIPID PANEL
Cholesterol: 150 mg/dL (ref 0–200)
Total CHOL/HDL Ratio: 3.9 RATIO
Triglycerides: 57 mg/dL (ref <150)
VLDL: 11 mg/dL (ref 0–40)

## 2011-09-27 MED ORDER — ASPIRIN 81 MG PO CHEW
81.0000 mg | CHEWABLE_TABLET | Freq: Every day | ORAL | Status: DC
  Administered 2011-09-27 – 2011-09-28 (×2): 81 mg via ORAL
  Filled 2011-09-27 (×2): qty 1

## 2011-09-27 MED ORDER — LORAZEPAM 2 MG/ML IJ SOLN
1.0000 mg | INTRAMUSCULAR | Status: AC
  Administered 2011-09-27: 1 mg via INTRAVENOUS

## 2011-09-27 MED ORDER — PANTOPRAZOLE SODIUM 40 MG PO TBEC
40.0000 mg | DELAYED_RELEASE_TABLET | Freq: Every day | ORAL | Status: DC
  Administered 2011-09-27: 40 mg via ORAL
  Filled 2011-09-27: qty 1

## 2011-09-27 MED ORDER — LORAZEPAM 2 MG/ML IJ SOLN
INTRAMUSCULAR | Status: AC
  Administered 2011-09-27: 1 mg via INTRAVENOUS
  Filled 2011-09-27: qty 1

## 2011-09-27 MED ORDER — ASPIRIN 81 MG PO CHEW: 81.0000 mg | CHEWABLE_TABLET | Freq: Every day | ORAL | Status: DC

## 2011-09-27 NOTE — Progress Notes (Signed)
Resting in bed. No longer feels anxious, watching tv. Cool cloth applied to freoead. Ultram given for pain as ordered

## 2011-09-27 NOTE — Progress Notes (Signed)
Pt. C/o worsening unrelieved headache. Anxious, states he just does not feel right. Vss,. See flosheet for details. No other changes in neuro status. paged md and updated on condition. Order for stat head ct.

## 2011-09-27 NOTE — Progress Notes (Signed)
OT Cancellation Note  Treatment cancelled today due to medical issues with patient which prohibited therapy (strict bedrest). OT to follow acutely Please increase activity orders as appropriate  Lucile Shutters Pager: [phone]  09/27/2011, 8:56 AM

## 2011-09-27 NOTE — Progress Notes (Signed)
C/o headache. Tylenol given for pain. callbell in reach

## 2011-09-27 NOTE — Evaluation (Signed)
Physical Therapy Evaluation Patient Details Name: Derek French MRN: [id number] DOB: [birth year] Today's Date: 09/27/2011 Time: 0865-7846 PT Time Calculation (min): 25 min  PT Assessment / Plan / Recommendation Clinical Impression  pt adm with R sided weakness and slurred speech. On eval, pt has improved functionally, but remains weaker on right than Left with gait abnormality and mild decr balance.  Rec. OPPT.    PT Assessment  Patient needs continued PT services    Follow Up Recommendations  Outpatient PT;Supervision for mobility/OOB    Barriers to Discharge        Equipment Recommendations  Defer to next venue    Recommendations for Other Services     Frequency Min 3X/week    Precautions / Restrictions Precautions Precautions: Fall   Pertinent Vitals/Pain       Mobility  Bed Mobility Bed Mobility: Supine to Sit;Sitting - Scoot to Edge of Bed Supine to Sit: 5: Supervision Sitting - Scoot to [place] of Bed: 5: Supervision Details for Bed Mobility Assistance: pt generally safe for bed mobility Transfers Transfers: Sit to Stand;Stand to Sit Sit to Stand: 5: Supervision Stand to Sit: 5: Supervision Details for Transfer Assistance: reinforced safety issues Ambulation/Gait Ambulation/Gait Assistance: 4: Min assist;4: Min guard Ambulation Distance (Feet): 160 Feet Assistive device: None Ambulation/Gait Assistance Details: pt's R foot dragged at intial toe off due to weakness and sequencing problem with df.  mild ataxia; general inccoordination of gait sequencing Gait Pattern: Step-through pattern;Ataxic;Decreased dorsiflexion - right Stairs: No Wheelchair Mobility Wheelchair Mobility: No    Exercises     PT Diagnosis: Abnormality of gait (mil R hemiparesis)  PT Problem List: Decreased strength;Decreased balance;Decreased mobility;Decreased coordination PT Treatment Interventions: Gait training;Functional mobility training;Therapeutic activities;Balance  training;Neuromuscular re-education;Patient/family education   PT Goals Acute Rehab PT Goals PT Goal Formulation: With patient Time For Goal Achievement: 10/04/11 Potential to Achieve Goals: Good Pt will go Supine/Side to Sit: Independently PT Goal: Supine/Side to Sit - Progress: Goal set today Pt will go Sit to Stand: with modified independence PT Goal: Sit to Stand - Progress: Goal set today Pt will Transfer Bed to Chair/Chair to Bed: with modified independence PT Transfer Goal: Bed to Chair/Chair to Bed - Progress: Goal set today Pt will Ambulate: with supervision;>150 feet PT Goal: Ambulate - Progress: Goal set today  Visit Information  Last PT Received On: 09/27/11 Assistance Needed: +1    Subjective Data  Subjective: When can I go home? Patient Stated Goal: Get home as soon as possible   Prior Functioning  Home Living Lives With: Other (Comment) Available Help at Discharge: Family Type of Home: House Home Access: Level entry Home Layout: One level Bathroom Shower/Tub: Tub/shower unit;Curtain Firefighter: Standard Home Adaptive Equipment: Bedside commode/3-in-1;Walker - rolling;Tub transfer bench Prior Function Level of Independence: Independent Able to Take Stairs?: Yes Driving: Yes Vocation: Full time employment Communication Communication: No difficulties    Cognition  Overall Cognitive Status: Appears within functional limits for tasks assessed/performed Arousal/Alertness: Awake/alert Orientation Level: Appears intact for tasks assessed Behavior During Session: [facility] for tasks performed    Extremity/Trunk Assessment Right Upper Extremity Assessment RUE ROM/Strength/Tone:  (grossly functions well, but notably weaker than L) Left Upper Extremity Assessment LUE ROM/Strength/Tone: Within functional levels Right Lower Extremity Assessment RLE ROM/Strength/Tone: Deficits RLE ROM/Strength/Tone Deficits: weak hip flexors, quads, hams, pf's at 4/5, df's at  3-/5 RLE Sensation:  (diminished to LT at shin) Left Lower Extremity Assessment LLE ROM/Strength/Tone: Within functional levels   Balance Balance Balance Assessed: Yes  Static Sitting Balance Static Sitting - Balance Support: No upper extremity supported;Feet supported;Feet unsupported Static Sitting - Level of Assistance: 5: Stand by assistance Static Standing Balance Static Standing - Balance Support: No upper extremity supported;During functional activity Static Standing - Level of Assistance: 5: Stand by assistance Dynamic Standing Balance Dynamic Standing - Balance Support: No upper extremity supported;During functional activity Dynamic Standing - Level of Assistance: 5: Stand by assistance;4: Min assist (depending on functional situation)  End of Session PT - End of Session Activity Tolerance: Patient tolerated treatment well Patient left: with family/visitor present;with call bell/phone within reach (sitting EOB) Nurse Communication: Mobility status  GP     Derek French, Eliseo Gum 09/27/2011, 4:30 PM  09/27/2011  Taos Bing, PT [phone] (pager)

## 2011-09-27 NOTE — Progress Notes (Signed)
Stroke Team Progress Note  HISTORY Derek French is an 46 y.o. male who awakened normal 09/26/2011. Was dropped off at work by his mother at 6AM and she reports that at that time he was normal. Was noted by co-workers to be staggering and not speaking normally. Symptoms further developed and co-workers called the patient's mother who brought him in by private vehicle. At the time of presentation was also complaining of right sided numbness and weakness and slurred speech. Patient received TPA candidate. He was admitted to the neuro ICU for further evaluation and treatment.  Drug screen was positive for cocaine. He had a presumed Left PCA infarct Feb 2013 with slurred speech and right hemiparesis. MRI was negative. Symptoms were felt to be secondary to vasospasm from cocaine use. UDS also positive for marijuana, opiates and benzos at that time.  SUBJECTIVE His fiance is at the bedside.  Overall he feels his condition is rapidly improving. He continues to complain of a severe headache. States he has seen Dr. Pearlean Brownie in clinic in past. Denies any illicit drug use recently.  OBJECTIVE Most recent Vital Signs: Filed Vitals:   09/27/11 0500 09/27/11 0600 09/27/11 0700 09/27/11 0800  BP: 125/75 116/76 94/54 113/84  Pulse: 54 52 52 53  Temp:    97.8 F (36.6 C)  TempSrc:    Oral  Resp: 16 15 13 13   Height:      Weight:  79.9 kg (176 lb 2.4 oz)    SpO2: 98% 96% 98% 97%   CBG (last 3)  Basename 09/26/11 0912 09/26/11 0852  GLUCAP 100* 117*   Intake/Output from previous day: 08/04 0701 - 08/05 0700 In: 2020 [P.O.:520; I.V.:1500] Out: 1450 [Urine:1450]  IV Fluid Intake:     . sodium chloride 75 mL/hr at 09/27/11 0805   MEDICATIONS    . sodium chloride  250 mL Intravenous Once  . alteplase  0.9 mg/kg Intravenous Once  . pantoprazole (PROTONIX) IV  40 mg Intravenous QHS   PRN:  acetaminophen, acetaminophen, labetalol, ondansetron (ZOFRAN) IV, senna-docusate, traMADol  Diet:  Cardiac thin  liquids Activity:  Bedrest DVT Prophylaxis:  SCDs   CLINICALLY SIGNIFICANT STUDIES Basic Metabolic Panel:  Lab 09/26/11 6578 09/26/11 0915  NA 140 138  K 3.8 3.8  CL 109 105  CO2 -- 22  GLUCOSE 103* 103*  BUN 10 11  CREATININE 0.90 0.95  CALCIUM -- 9.4  MG -- --  PHOS -- --   Liver Function Tests:  Lab 09/26/11 0915  AST 15  ALT 14  ALKPHOS 64  BILITOT 0.6  PROT 7.2  ALBUMIN 3.7   CBC:  Lab 09/26/11 0920 09/26/11 0915  WBC -- 9.0  NEUTROABS -- 5.6  HGB 17.0 16.3  HCT 50.0 46.6  MCV -- 93.0  PLT -- 277   Coagulation:  Lab 09/26/11 0915  LABPROT 14.1  INR 1.07   Cardiac Enzymes:  Lab 09/26/11 0916  CKTOTAL 256*  CKMB 4.1*  CKMBINDEX --  TROPONINI <0.30   Urinalysis: No results found for this basename: COLORURINE:2,APPERANCEUR:2,LABSPEC:2,PHURINE:2,GLUCOSEU:2,HGBUR:2,BILIRUBINUR:2,KETONESUR:2,PROTEINUR:2,UROBILINOGEN:2,NITRITE:2,LEUKOCYTESUR:2 in the last 168 hours Lipid Panel    Component Value Date/Time   CHOL 150 09/27/2011 0500   TRIG 57 09/27/2011 0500   HDL 38* 09/27/2011 0500   CHOLHDL 3.9 09/27/2011 0500   VLDL 11 09/27/2011 0500   LDLCALC 101* 09/27/2011 0500   HgbA1C  Lab Results  Component Value Date   HGBA1C 5.8* 03/29/2011   Urine Drug Screen:     Component Value Date/Time  LABOPIA NONE DETECTED 09/26/2011 2245   COCAINSCRNUR POSITIVE* 09/26/2011 2245   LABBENZ NONE DETECTED 09/26/2011 2245   AMPHETMU NONE DETECTED 09/26/2011 2245   THCU POSITIVE* 09/26/2011 2245   LABBARB NONE DETECTED 09/26/2011 2245    Alcohol Level: No results found for this basename: ETH:2 in the last 168 hours  CT of the brain   09/26/2011   No acute intracranial abnormalities.  No significant change since previous study.   09/26/2011   1.  No acute intracranial abnormalities. 2.  Atrophy and chronic microvascular disease as before.    MRI of the brain  09/27/2011 1. No acute intracranial abnormality. 2. Stable mild nonspecific periatrial white matter signal changes.   MRA of the  brain  09/27/2011 Stable and essentially negative intracranial MRA.  2D Echocardiogram    Carotid Doppler  No internal carotid artery stenosis bilaterally. Vertebrals with antegrade flow bilaterally.   CXR  09/27/2011 Stable examination with mild basilar atelectasis. No acute  process.  EKG  normal sinus rhythm.   Therapy Recommendations PT -, OT -  Physical Exam    GENERAL EXAM: Patient is in no distress  CARDIOVASCULAR: Regular rate and rhythm, no murmurs, no carotid bruits  NEUROLOGIC: MENTAL STATUS: awake, alert, language fluent, comprehension intact, naming intact CRANIAL NERVE: no papilledema on fundoscopic exam, pupils equal and reactive to light, visual fields full to confrontation, extraocular muscles intact, no nystagmus, facial sensation and strength symmetric, uvula midline, shoulder shrug symmetric, tongue midline. MOTOR: normal bulk and tone, full strength in the BUE, BLE; EXCEPT FLUCTUATING WEAKNESS IN RUE AND RLE. DRIFT IN RUE AND RLE.  SENSORY: DECREASED LT IN RUE AND RLE.  COORDINATION: finger-nose-finger, fine finger movements, heel-shin normal REFLEXES: deep tendon reflexes present and symmetric GAIT/STATION: NOT ASSESSED   ASSESSMENT [name] is a 46 y.o. male with a dysarthria, right sided numbness and staggering gait status post IV t-PA 09/26/2011 at 0957. Patient positive for cocaine. Same symptoms Feb 2013 with negative MRI; symptoms felt to be secondary to vasospasm from cocaine use.  Currently symptoms likely same etiology. MRI negative this admission as well. On aspirin 81 mg orally every day prior to admission. Now on no antiplatelets for secondary stroke prevention as within the 24h tPA window. Patient with resultant right arm and leg weakness.  -headache -substance abuse: cocaine, marijuana   Hospital day # 1  TREATMENT/PLAN -Continue aspirin 81 mg orally every day for secondary stroke prevention. -stop cocaine use; counseling -OOB, therapy  evals -transfer to the floor -get a primary care MD; once established, then follow up with Dr. Pearlean Brownie (Stroke Clinic).  Annie Main, MSN, RN, ANVP-BC, ANP-BC, [facility] Pager: [phone] 09/27/2011 8:31 AM  Dr. Joycelyn Schmid has personally reviewed chart, pertinent data, examined the patient and developed the plan of care.  [facility] - Stroke Team Joycelyn Schmid, MD 09/27/2011, 3:18 PM   Please refer to [email or web address] for on-call Stroke MD

## 2011-09-27 NOTE — Progress Notes (Signed)
Patient with stroke symptoms, and s/p TPA.   Patient was complaining of headaches that was worse than this morning,   Ordered a  Stat Head CT;    Negative for bleed or any other etiology   Patient reevaluated at 4:00 AM He is resting comfortably and Rn mentioned that patient has not voiced any complaints.  No reason to do another head CT  Will cancel   Because the repeat Head CT does not indicate stroke it is less likely to show stroke on MRI   Head CT: Reviewed Independently *RADIOLOGY REPORT*  Clinical Data: Post t-PA with acute change in status, now  complaining of headaches.  CT HEAD WITHOUT CONTRAST  Technique: Contiguous axial images were obtained from the base of  the skull through the vertex without contrast.  Comparison: 09/26/2011 at 0926 hours  Findings: No significant change in appearance of intracranial  contents since previous study. No evidence of acute intracranial  abnormality. No developing mass effect, edema, or acute  intracranial hemorrhage.  IMPRESSION:  No acute intracranial abnormalities. No significant change since  previous study.

## 2011-09-27 NOTE — Progress Notes (Signed)
UR complete 

## 2011-09-27 NOTE — Progress Notes (Signed)
Dr. Eilleen Kempf in to see pt. And updated on condition and lab/ct results. Am ct cancelled, mri/mra ordered.

## 2011-09-28 NOTE — Progress Notes (Signed)
CARE MANAGEMENT NOTE 09/28/2011  Patient:  Derek French, Derek French   Account Number:  [id number]  Date Initiated:  09/27/2011  Documentation initiated by:  Carlyle Lipa  Subjective/Objective Assessment:   stroke, s/p TPA     Action/Plan:   home vs CIR when stable   Anticipated DC Date:  09/28/2011   Anticipated DC Plan:  HOME/SELF CARE      DC Planning Services  CM consult      Choice offered to / List presented to:             Status of service:  Completed, signed off Medicare Important Message given?   (If response is "NO", the following Medicare IM given date fields will be blank) Date Medicare IM given:   Date Additional Medicare IM given:    Discharge Disposition:    Per UR Regulation:  Reviewed for med. necessity/level of care/duration of stay  If discussed at Long Length of Stay Meetings, dates discussed:    Comments:  09/28/11 1554 Vance Peper, RN BSN Case Manager [phone] [facility]  [facility]. CM spoke with patient and his mother and explained that they will receive a call from the rehab center to arrange date and time of first visit.

## 2011-09-28 NOTE — Evaluation (Signed)
Occupational Therapy Evaluation Patient Details Name: Derek French MRN: [id number] DOB: 1965-06-07 Today's Date: 09/28/2011 Time: 4098-1191 OT Time Calculation (min): 23 min  OT Assessment / Plan / Recommendation Clinical Impression  This 46 y.o. male admitted with Rt. sided weakness.  MRI negative.  Pt. with h/o vasospasm due to cocaine use, and per chart, this presentation is similar.  Pt. demonstrates impaired balance,  Very mild Rt. UE weakness, Rt inferior field deficit, and impaired visual convergence.  Pt. has assist/supervision at home, and has all DME needed.  Recommend OT to maximize safety and independence with BADLs and IADLs to allow him to return to PLOF.  Recommend no driving or working until cleared by MD, and recommend OPOT    OT Assessment  Patient needs continued OT Services    Follow Up Recommendations  Supervision/Assistance - 24 hour;Supervision - Intermittent;Outpatient OT    Barriers to Discharge None    Equipment Recommendations  Rolling walker with 5" wheels    Recommendations for Other Services    Frequency  Min 2X/week    Precautions / Restrictions Precautions Precautions: Fall Restrictions Weight Bearing Restrictions: No       ADL  Eating/Feeding: Simulated;Independent Where Assessed - Eating/Feeding: Edge of bed Grooming: Simulated;Wash/dry hands;Wash/dry face;Teeth care;Supervision/safety Where Assessed - Grooming: Supported standing Upper Body Bathing: Simulated;Supervision/safety Where Assessed - Upper Body Bathing: Supported standing Lower Body Bathing: Simulated;Supervision/safety Where Assessed - Lower Body Bathing: Supported standing Upper Body Dressing: Simulated;Set up Where Assessed - Upper Body Dressing: Unsupported sitting Lower Body Dressing: Simulated;Performed;Supervision/safety Where Assessed - Lower Body Dressing: Supported sit to stand Toilet Transfer: Performed;Min guard Statistician Method: Sit to Writer: Regular height toilet (requires min guard assist to low surface) Toileting - Clothing Manipulation and Hygiene: Simulated;Supervision/safety Where Assessed - Glass blower/designer Manipulation and Hygiene: Standing Tub/Shower Transfer: Simulated;Supervision/safety (using grab bar) Tub/Shower Transfer Method: Science writer: Grab bars Equipment Used: Rolling walker Transfers/Ambulation Related to ADLs: supervision with RW.  Pt. exagerates Rt. knee flexion to insure he clears toe ADL Comments: Pt. simulated showering in standing with supervision.  Pt. also simulated stepping over tub, using grab bar with supervision.  pt. does requires min guard assist for toilet transfer onto low surface without UE assist.  He reports he has a sink next to his commode, and was able to perform with supervision with UE support.  Pt instructed to have assist with him initially, to use tub bench if he feels unsteady at all.  Also discussed no return to work and driving until cleared by MD.       OT Diagnosis: Generalized weakness;Disturbance of vision  OT Problem List: Decreased strength;Impaired balance (sitting and/or standing);Impaired vision/perception OT Treatment Interventions: Self-care/ADL training;Neuromuscular education;Therapeutic activities;Visual/perceptual remediation/compensation;Patient/family education;Balance training   OT Goals Acute Rehab OT Goals OT Goal Formulation: With patient Time For Goal Achievement: 10/05/11 Potential to Achieve Goals: Good ADL Goals Pt Will Perform Grooming: with modified independence;Standing at sink ADL Goal: Grooming - Progress: Goal set today Pt Will Perform Upper Body Bathing: with modified independence;Standing at sink ADL Goal: Upper Body Bathing - Progress: Goal set today Pt Will Perform Lower Body Bathing: with modified independence;Sit to stand from chair;Sit to stand from bed Pt Will Perform Upper Body Dressing: with  modified independence;Sitting, chair;Sitting, bed ADL Goal: Upper Body Dressing - Progress: Goal set today Pt Will Perform Lower Body Dressing: with modified independence;Sit to stand from chair;Sit to stand from bed ADL Goal: Lower Body Dressing - Progress:  Goal set today Pt Will Transfer to Toilet: with modified independence;Ambulation;Regular height toilet ADL Goal: Toilet Transfer - Progress: Goal set today Pt Will Perform Toileting - Clothing Manipulation: with modified independence;Standing ADL Goal: Toileting - Clothing Manipulation - Progress: Goal set today Pt Will Perform Tub/Shower Transfer: Tub transfer;with modified independence;Ambulation;Grab bars ADL Goal: Tub/Shower Transfer - Progress: Goal set today Additional ADL Goal #1: Pt. will read one page of information without difficulty ADL Goal: Additional Goal #1 - Progress: Goal set today Additional ADL Goal #2: Pt. will negotiate unfamiliar, visually challenging environment modified independently ADL Goal: Additional Goal #2 - Progress: Goal set today  Visit Information  Last OT Received On: 09/28/11 Assistance Needed: +1    Subjective Data  Subjective: "I'm ready to go home" Patient Stated Goal: To go home   Prior Functioning  Vision/Perception  Home Living Lives With: Family (and fiancee') Available Help at Discharge: Family;Available 24 hours/day Type of Home: House Home Access: Level entry Home Layout: One level Bathroom Shower/Tub: Tub/shower unit;Curtain Firefighter: Standard Bathroom Accessibility: Yes How Accessible: Accessible via walker Home Adaptive Equipment: Bedside commode/3-in-1;Walker - rolling;Tub transfer bench;Grab bars in shower Prior Function Level of Independence: Independent Able to Take Stairs?: Yes Driving: Yes Vocation:  (works in Musician) Musician: No difficulties Dominant Hand: Right   Vision - Assessment Eye Alignment: Impaired (comment) Vision  Assessment: Vision tested Ocular Range of Motion: Within Functional Limits Tracking/Visual Pursuits: Able to track stimulus in all quads without difficulty Saccades: Undershoots (on Rt. - mild) Convergence: Impaired (comment) Visual Fields: Right visual field deficit (Inconsistent responses Rt. inferior quadrant) Additional Comments: Pt. with convergence to ~14" from nose which is outside of norms.   Perception Perception: Within Functional Limits Praxis Praxis: Intact  Cognition  Overall Cognitive Status: Appears within functional limits for tasks assessed/performed Arousal/Alertness: Awake/alert Orientation Level: Appears intact for tasks assessed Behavior During Session: [facility] for tasks performed    Extremity/Trunk Assessment Right Upper Extremity Assessment RUE ROM/Strength/Tone: Deficits RUE ROM/Strength/Tone Deficits: strength 4/5 RUE Sensation: Deficits RUE Sensation Deficits: Pt. reports numbness mid forearm  RUE Coordination: WFL - gross/fine motor Left Upper Extremity Assessment LUE ROM/Strength/Tone: Within functional levels LUE Sensation: WFL - Light Touch LUE Coordination: WFL - gross/fine motor Trunk Assessment Trunk Assessment: Other exceptions (mild truncal ataxi noted when pt sitting on low surface)   Mobility Bed Mobility Bed Mobility: Supine to Sit;Sitting - Scoot to Edge of Bed Supine to Sit: 6: Modified independent (Device/Increase time) Sitting - Scoot to Edge of Bed: 6: Modified independent (Device/Increase time) Transfers Transfers: Sit to Stand;Stand to Sit Sit to Stand: 6: Modified independent (Device/Increase time) Stand to Sit: 6: Modified independent (Device/Increase time) Details for Transfer Assistance: requires cues for hand placement   Exercise    Balance Balance Balance Assessed: Yes Dynamic Standing Balance Dynamic Standing - Balance Support: No upper extremity supported Dynamic Standing - Level of Assistance: 5: Stand by assistance High  Level Balance High Level Balance Activites:  (simulated shower in standing)  End of Session OT - End of Session Activity Tolerance: Patient tolerated treatment well Patient left: in bed;with call bell/phone within reach;with family/visitor present  GO     Tamela Elsayed, Ursula Alert M 09/28/2011, 12:10 PM

## 2011-09-28 NOTE — Discharge Summary (Signed)
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  Past Medical History  Diagnosis Date  . Recurrent boils   . Stroke     2011 treated at [facility]  . Headache   . Cerebral embolism with cerebral infarction 09/26/2011   History reviewed. No pertinent past surgical history.  Medication List  As of 09/28/2011  4:23 PM   STOP taking these medications         ibuprofen 200 MG tablet         TAKE these medications         aspirin 81 MG chewable tablet   Chew 81 mg by mouth daily.           LABORATORY STUDIES CBC    Component Value Date/Time   WBC 9.0 09/26/2011 0915   RBC 5.01 09/26/2011 0915   HGB 17.0 09/26/2011 0920   HCT 50.0 09/26/2011 0920   PLT 277 09/26/2011 0915   MCV 93.0 09/26/2011 0915   MCH 32.5 09/26/2011 0915   MCHC 35.0 09/26/2011 0915   RDW 14.0 09/26/2011 0915   LYMPHSABS 2.3 09/26/2011 0915   MONOABS 1.1* 09/26/2011 0915   EOSABS 0.1 09/26/2011 0915   BASOSABS 0.0 09/26/2011 0915   CMP    Component Value Date/Time   NA 140 09/26/2011 0920   K 3.8 09/26/2011 0920   CL 109 09/26/2011 0920   CO2 22 09/26/2011 0915   GLUCOSE 103* 09/26/2011 0920   BUN 10 09/26/2011 0920   CREATININE 0.90 09/26/2011 0920   CALCIUM 9.4 09/26/2011 0915   PROT 7.2 09/26/2011 0915   ALBUMIN 3.7 09/26/2011 0915   AST 15 09/26/2011 0915   ALT 14 09/26/2011 0915   ALKPHOS 64 09/26/2011 0915   BILITOT 0.6 09/26/2011  0915   GFRNONAA >90 09/26/2011 0915   GFRAA >90 09/26/2011 0915   COAGS Lab Results  Component Value Date   INR 1.07 09/26/2011   INR 1.00 03/28/2011   Lipid Panel    Component Value Date/Time   CHOL 150 09/27/2011 0500   TRIG 57 09/27/2011 0500   HDL 38* 09/27/2011 0500   CHOLHDL 3.9 09/27/2011 0500   VLDL 11 09/27/2011 0500   LDLCALC 101* 09/27/2011 0500   HgbA1C  Lab Results  Component Value Date   HGBA1C 5.7* 09/27/2011   Cardiac Panel (last 3 results)   Basename 09/26/11 0916  CKTOTAL 256*  CKMB 4.1*  TROPONINI <0.30  RELINDX 1.6   Urine Drug Screen    Component Value Date/Time   LABOPIA NONE DETECTED 09/26/2011 2245   COCAINSCRNUR POSITIVE* 09/26/2011 2245   LABBENZ NONE DETECTED 09/26/2011 2245   AMPHETMU NONE DETECTED 09/26/2011 2245   THCU POSITIVE* 09/26/2011 2245   LABBARB NONE DETECTED 09/26/2011 2245    Alcohol Level No results found for this basename: eth   SIGNIFICANT DIAGNOSTIC STUDIES CT of the brain  09/26/2011  No acute intracranial abnormalities. No significant change since previous study.  09/26/2011 1. No acute intracranial abnormalities. 2. Atrophy and chronic microvascular disease as before.  MRI of the brain 09/27/2011 1. No acute intracranial abnormality. 2. Stable mild nonspecific periatrial white matter signal changes.  MRA of the brain 09/27/2011 Stable and essentially negative intracranial MRA.  2D Echocardiogram completed, result pending at time of discharge Carotid Doppler No internal carotid artery stenosis bilaterally. Vertebrals with antegrade flow bilaterally.  CXR 09/27/2011 Stable examination with mild basilar atelectasis. No acute  process.  EKG normal sinus rhythm.     History of Present Illness   Derek French is an 46 y.o. male who awakened normal 09/26/2011. Was dropped off at work by his mother at 6AM and she reports that at that time he was normal. Was noted by co-workers to be staggering and not speaking normally. Symptoms further developed and co-workers  called the patient's mother who brought him in by private vehicle. At the time of presentation was also complaining of right sided numbness and weakness and slurred speech. Patient received TPA. He was admitted to the neuro ICU for further evaluation and treatment. Drug screen was positive for cocaine. He had cerebral ischemia, a presumed infarct Feb 2013 with slurred speech and right hemiparesis. MRI was negative. Symptoms were felt to be secondary to vasospasm from cocaine use. UDS positive for cocaine, marijuana, opiates and benzos at that time.  Hospital Course Patient tolerated tPA without complication. Imaging at 24 hours shows no hemorrhage. MRI confirmed NO ischemic infarct. UDS was positive for marijuana, cocaine this admission. Right sided numbness and weakness, same as in Feb 2013, was felt to be secondary to vasospasm from cocaine use. Some improvement, but not totally resolved. He was restarted on  aspirin 81 mg orally every day for secondary stroke prevention.  Patient with continued stroke symptoms of right hemiparesis and numbness. Physical therapy, occupational therapy and speech therapy evaluated patient. They recommend OP PT and OT along with a RW with 5" wheels.  Discharge Exam  Blood pressure 133/71, pulse 59, temperature 97.8 F (36.6 C), temperature source Oral, resp. rate 16, height 6\' 2"  (1.88 m), weight 79.9 kg (176 lb 2.4 oz), SpO2 99.00%.  GENERAL EXAM:  Patient is in no distress  CARDIOVASCULAR:  Regular rate and rhythm, no murmurs, no carotid bruits  NEUROLOGIC:  MENTAL STATUS: awake, alert, language fluent, comprehension intact, naming intact  CRANIAL NERVE: no papilledema on fundoscopic exam, pupils equal and reactive to light, visual fields full to confrontation, extraocular muscles intact, no nystagmus, facial sensation and strength symmetric, uvula midline, shoulder shrug symmetric, tongue midline.  MOTOR: normal bulk and tone, full strength in the BUE, BLE; EXCEPT  FLUCTUATING WEAKNESS IN RUE AND RLE. DRIFT IN RUE AND RLE.  SENSORY: DECREASED LT IN RUE AND RLE.  COORDINATION: finger-nose-finger, fine finger movements, heel-shin normal  REFLEXES: deep tendon reflexes present and symmetric  GAIT/STATION: NOT ASSESSED  Discharge Diet   Cardiac thin liquids  Discharge Plan   - Disposition:  Home with family - aspirin 81 mg orally every day for secondary stroke prevention. - Ongoing risk factor control by Primary Care Physician. - Risk factor recommendations:  Stop cocaine and illegal drug use - OP PT and OT, rolling walker with 5" wheels  - Follow-up DEFAULT,PROVIDER, MD in 2 weeks.  - Follow-up with Dr. Delia Heady in 2 months or once you have seen your primary care MD.  Signed Annie Main, AVNP, ANP-BC, GNP-BC Stroke  Center Nurse Practitioner 09/28/2011, 4:23 PM  Dr. Joycelyn Schmid, has personally reviewed chart, pertinent data, examined the patient and developed the plan of care.  [facility] - Stroke Team Joycelyn Schmid, MD 09/28/2011, 6:45 PM   Please refer to [email or web address] for on-call Stroke MD

## 2011-09-28 NOTE — Progress Notes (Addendum)
Physical Therapy Treatment Patient Details Name: Derek French MRN: [id number] DOB: 17-Dec-1965 Today's Date: 09/28/2011 Time: 7829-5621 PT Time Calculation (min): 23 min  PT Assessment / Plan / Recommendation Comments on Treatment Session  Patient motivated and eager to progress with ambulation. Agreeable to use RW with ambulation to increase stability and independence.     Follow Up Recommendations  Outpatient PT;Supervision for mobility/OOB    Barriers to Discharge        Equipment Recommendations  Rolling walker with 5" wheels    Recommendations for Other Services    Frequency Min 3X/week   Plan Discharge plan remains appropriate;Frequency remains appropriate    Precautions / Restrictions Precautions Precautions: Fall Restrictions Weight Bearing Restrictions: No   Pertinent Vitals/Pain     Mobility  Bed Mobility Supine to Sit: 6: Modified independent (Device/Increase time) Sitting - Scoot to Edge of Bed: 6: Modified independent (Device/Increase time) Transfers Sit to Stand: 6: Modified independent (Device/Increase time) Stand to Sit: 6: Modified independent (Device/Increase time) Ambulation/Gait Ambulation/Gait Assistance: 4: Min assist;5: Supervision Ambulation Distance (Feet): 300 Feet Assistive device: Rolling walker;1 person hand held assist Ambulation/Gait Assistance Details: Patient ambulated without RW and Min HHA due to R foot drop and need for compensation. With RW for support patient able to ambulate with S. Patient initially not wanting to use RW but he was agreeable knowing it will increase his overall independence! Gait Pattern: Step-through pattern;Decreased dorsiflexion - right Gait velocity: decreased Modified Rankin (Stroke Patients Only) Pre-Morbid Rankin Score: No symptoms Modified Rankin: Moderately severe disability    Exercises     PT Diagnosis:    PT Problem List:   PT Treatment Interventions:     PT Goals Acute Rehab PT Goals PT Goal:  Supine/Side to Sit - Progress: Progressing toward goal PT Goal: Sit to Stand - Progress: Met PT Transfer Goal: Bed to Chair/Chair to Bed - Progress: Progressing toward goal PT Goal: Ambulate - Progress: Progressing toward goal  Visit Information  Last PT Received On: 09/28/11 Assistance Needed: +1    Subjective Data      Cognition  Overall Cognitive Status: Appears within functional limits for tasks assessed/performed Arousal/Alertness: Awake/alert Orientation Level: Appears intact for tasks assessed Behavior During Session: [facility] for tasks performed    Balance     End of Session PT - End of Session Equipment Utilized During Treatment: Gait belt Activity Tolerance: Patient tolerated treatment well Patient left: in chair;with call bell/phone within reach;with family/visitor present Nurse Communication: Mobility status   GP     [name] 09/28/2011, 11:57 AM 09/28/2011 [name] PTA [phone] pager [phone] office

## 2011-09-28 NOTE — Progress Notes (Signed)
Stroke Team Progress Note  HISTORY Derek French is an 46 y.o. male who awakened normal 09/26/2011. Was dropped off at work by his mother at 6AM and she reports that at that time he was normal. Was noted by co-workers to be staggering and not speaking normally. Symptoms further developed and co-workers called the patient's mother who brought him in by private vehicle. At the time of presentation was also complaining of right sided numbness and weakness and slurred speech. Patient received TPA candidate. He was admitted to the neuro ICU for further evaluation and treatment.  Drug screen was positive for cocaine. He had a presumed Left PCA infarct Feb 2013 with slurred speech and right hemiparesis. MRI was negative. Symptoms were felt to be secondary to vasospasm from cocaine use. UDS also positive for marijuana, opiates and benzos at that time.  SUBJECTIVE Patient continues to complain of right FA numbness and mild headache.  OBJECTIVE Most recent Vital Signs: Filed Vitals:   09/27/11 2305 09/28/11 0214 09/28/11 1008 09/28/11 1413  BP: 130/64 123/78 139/76 133/71  Pulse: 57 52 60 59  Temp: 97.9 F (36.6 C) 98.1 F (36.7 C) 97.9 F (36.6 C) 97.8 F (36.6 C)  TempSrc: Oral Oral Oral Oral  Resp: 16 16 16 16   Height:      Weight:      SpO2: 98% 97% 96% 99%   CBG (last 3)  Basename 09/26/11 0912 09/26/11 0852  GLUCAP 100* 117*   Intake/Output from previous day: 08/05 0701 - 08/06 0700 In: 1290 [P.O.:740; I.V.:550] Out: 2400 [Urine:2400]  IV Fluid Intake:     . DISCONTD: sodium chloride Stopped (09/27/11 1520)   MEDICATIONS    . aspirin  81 mg Oral Daily  . DISCONTD: sodium chloride  250 mL Intravenous Once  . DISCONTD: aspirin  81 mg Oral Daily  . DISCONTD: pantoprazole  40 mg Oral Q1200   PRN:  acetaminophen, labetalol, senna-docusate, traMADol, DISCONTD: acetaminophen, DISCONTD: ondansetron (ZOFRAN) IV  Diet:  Cardiac thin liquids Activity:  OOB DVT Prophylaxis:  SCDs    CLINICALLY SIGNIFICANT STUDIES Basic Metabolic Panel:  Lab 09/26/11 1610 09/26/11 0915  NA 140 138  K 3.8 3.8  CL 109 105  CO2 -- 22  GLUCOSE 103* 103*  BUN 10 11  CREATININE 0.90 0.95  CALCIUM -- 9.4  MG -- --  PHOS -- --   Liver Function Tests:  Lab 09/26/11 0915  AST 15  ALT 14  ALKPHOS 64  BILITOT 0.6  PROT 7.2  ALBUMIN 3.7   CBC:  Lab 09/26/11 0920 09/26/11 0915  WBC -- 9.0  NEUTROABS -- 5.6  HGB 17.0 16.3  HCT 50.0 46.6  MCV -- 93.0  PLT -- 277   Coagulation:  Lab 09/26/11 0915  LABPROT 14.1  INR 1.07   Cardiac Enzymes:  Lab 09/26/11 0916  CKTOTAL 256*  CKMB 4.1*  CKMBINDEX --  TROPONINI <0.30   Urinalysis: No results found for this basename: COLORURINE:2,APPERANCEUR:2,LABSPEC:2,PHURINE:2,GLUCOSEU:2,HGBUR:2,BILIRUBINUR:2,KETONESUR:2,PROTEINUR:2,UROBILINOGEN:2,NITRITE:2,LEUKOCYTESUR:2 in the last 168 hours Lipid Panel    Component Value Date/Time   CHOL 150 09/27/2011 0500   TRIG 57 09/27/2011 0500   HDL 38* 09/27/2011 0500   CHOLHDL 3.9 09/27/2011 0500   VLDL 11 09/27/2011 0500   LDLCALC 101* 09/27/2011 0500   HgbA1C  Lab Results  Component Value Date   HGBA1C 5.7* 09/27/2011   Urine Drug Screen:     Component Value Date/Time   LABOPIA NONE DETECTED 09/26/2011 2245   COCAINSCRNUR POSITIVE* 09/26/2011 2245  LABBENZ NONE DETECTED 09/26/2011 2245   AMPHETMU NONE DETECTED 09/26/2011 2245   THCU POSITIVE* 09/26/2011 2245   LABBARB NONE DETECTED 09/26/2011 2245    Alcohol Level: No results found for this basename: ETH:2 in the last 168 hours  CT of the brain   09/26/2011   No acute intracranial abnormalities.  No significant change since previous study.   09/26/2011   1.  No acute intracranial abnormalities. 2.  Atrophy and chronic microvascular disease as before.    MRI of the brain  09/27/2011 1. No acute intracranial abnormality. 2. Stable mild nonspecific periatrial white matter signal changes.   MRA of the brain  09/27/2011 Stable and essentially negative  intracranial MRA.  2D Echocardiogram    Carotid Doppler  No internal carotid artery stenosis bilaterally. Vertebrals with antegrade flow bilaterally.   CXR  09/27/2011 Stable examination with mild basilar atelectasis. No acute  process.  EKG  normal sinus rhythm.   Therapy Recommendations PT -OP, RW  W/ 2" wheels; OT -OP, 24h supervision  GENERAL EXAM: Patient is in no distress  CARDIOVASCULAR: Regular rate and rhythm, no murmurs, no carotid bruits  NEUROLOGIC: MENTAL STATUS: awake, alert, language fluent, comprehension intact, naming intact CRANIAL NERVE: no papilledema on fundoscopic exam, pupils equal and reactive to light, visual fields full to confrontation, extraocular muscles intact, no nystagmus, facial sensation and strength symmetric, uvula midline, shoulder shrug symmetric, tongue midline. MOTOR: normal bulk and tone, full strength in the BUE, BLE; EXCEPT FLUCTUATING WEAKNESS IN RUE AND RLE. DRIFT IN RUE AND RLE.  SENSORY: DECREASED LT IN RUE AND RLE.  COORDINATION: finger-nose-finger, fine finger movements, heel-shin normal REFLEXES: deep tendon reflexes present and symmetric GAIT/STATION: NOT ASSESSED  ASSESSMENT [name] is a 46 y.o. male with a dysarthria, right sided numbness and staggering gait status post IV t-PA 09/26/2011 at 0957. Patient positive for cocaine. Same symptoms Feb 2013 with negative MRI; symptoms felt to be secondary to vasospasm from cocaine use.  Currently symptoms likely same etiology. MRI negative this admission as well. On aspirin 81 mg orally every day prior to admission. Now on no antiplatelets for secondary stroke prevention as within the 24h tPA window. Patient with resultant right arm and leg weakness.  -headache -substance abuse: cocaine, marijuana -hyperlipidemia, LDL 101  Hospital day # 2  TREATMENT/PLAN -Continue aspirin 81 mg orally every day for secondary stroke prevention. -OP PT and OT, RW with 5" wheels -get a primary  care MD; once established, then follow up with Dr. Pearlean Brownie (Stroke Clinic). -discharge home  Annie Main, MSN, RN, ANVP-BC, ANP-BC, GNP-BC [facility] Pager: [phone] 09/28/2011 3:00 PM  Dr. Joycelyn Schmid has personally reviewed chart, pertinent data, examined the patient and developed the plan of care.  [facility] - Stroke Team Joycelyn Schmid, MD 09/28/2011, 6:45 PM   Please refer to [email or web address] for on-call Stroke MD

## 2011-09-28 NOTE — Progress Notes (Signed)
*  PRELIMINARY RESULTS* Echocardiogram 2D Echocardiogram has been performed.  Derek French 09/28/2011, 10:04 AM

## 2011-09-28 NOTE — Progress Notes (Signed)
Patient D/C instructions and stroke education provided to patient and mother. S/S of stroke, personal risk factors, and prevention discussed with patient. All questions answered to patient's satisfaction. Pt D/C home with no signs of acute distress.

## 2011-09-29 NOTE — Clinical Social Work Psychosocial (Signed)
     Clinical Social Work Department BRIEF PSYCHOSOCIAL ASSESSMENT 09/29/2011  Patient:  Derek French, Derek French     Account Number:  [id number]     Admit date:  09/26/2011  Clinical Social Worker:  Peggyann Shoals  Date/Time:  09/28/2011 04:00 PM  Referred by:  Physician  Date Referred:  09/27/2011 Referred for  Substance Abuse   Other Referral:   Interview type:  Patient Other interview type:    PSYCHOSOCIAL DATA Living Status:  ALONE Admitted from facility:   Level of care:   Primary support name:  Hoyt Koch Primary support relationship to patient:  PARENT Degree of support available:   Supportive.    CURRENT CONCERNS Current Concerns  Substance Abuse   Other Concerns:    SOCIAL WORK ASSESSMENT / PLAN CSW met with pt to address consult. Pt's tox screen was positive for THC and cocaine. CSW completed SBIRT. Pt endorses THC use and denies knowingly cocaine use. Pt was admitted to the hospital in Feb due to cocaine use resulting in similar symptoms. Pt shared that he has not used cocaine since his admission. Pt endorses THC and ETOH use, on again and off again. Pt stated that is "done using." Pt also repored that he is thankful for being alive as he has a second chance. Pt reported that he also is in a relationship and maintained a good relationships with his family. Pt shared that he does not want to jepordize these relationships as they are supportive with his recovery. CSW provided supportive counseling around substance use. CSW is signing off as no further needs identified. Pt is ready for discharge home.   Assessment/plan status:  No Further Intervention Required Other assessment/ plan:   Information/referral to community resources:   CSW offered resources. Pt declined, as he still the resources that were provided during previous admission.    PATIENTS/FAMILYS RESPONSE TO PLAN OF CARE: Pt was very pleasant. Pt shared that he is seeking assistance with the refraining from  substrance use.

## 2011-09-29 NOTE — Progress Notes (Signed)
I collaborated with this patient. 09/29/2011  [place] Bing, PT [phone] (pager)

## 2011-10-08 ENCOUNTER — Encounter (HOSPITAL_COMMUNITY): Payer: Self-pay

## 2011-10-08 ENCOUNTER — Emergency Department (HOSPITAL_COMMUNITY)
Admission: EM | Admit: 2011-10-08 | Discharge: 2011-10-08 | Disposition: A | Discharge status: Home / Self Care | Payer: Self-pay | Attending: Emergency Medicine | Admitting: Emergency Medicine

## 2011-10-08 DIAGNOSIS — IMO0002 Reserved for concepts with insufficient information to code with codable children: Secondary | ICD-10-CM | POA: Insufficient documentation

## 2011-10-08 DIAGNOSIS — Z8614 Personal history of Methicillin resistant Staphylococcus aureus infection: Secondary | ICD-10-CM | POA: Insufficient documentation

## 2011-10-08 DIAGNOSIS — L02411 Cutaneous abscess of right axilla: Secondary | ICD-10-CM

## 2011-10-08 DIAGNOSIS — F17200 Nicotine dependence, unspecified, uncomplicated: Secondary | ICD-10-CM | POA: Insufficient documentation

## 2011-10-08 MED ORDER — ACETAMINOPHEN 500 MG PO TABS: 500.0000 mg | ORAL_TABLET | Freq: Four times a day (QID) | ORAL | Status: AC | PRN

## 2011-10-08 NOTE — ED Notes (Signed)
Pt here for abscess under right arm for three days, denies drainage

## 2011-10-08 NOTE — ED Notes (Signed)
Discharged home with instructions.  Understands follow up care .  No questions or concerns at discharge.

## 2011-10-08 NOTE — ED Provider Notes (Signed)
History     CSN: [id number]  Arrival date & time 10/08/11  1320   First MD Initiated Contact with Patient 10/08/11 1328      Chief Complaint  Patient presents with  . Abscess    (Consider location/radiation/quality/duration/timing/severity/associated sxs/prior treatment) HPI  46 year old male with hx of recurrent abscesses presents c/o of abscess to R axillary region x 3 days.  C/o tenderness, and swelling to affected area.  Has tried warm compress without relief.  Denies fever, chills, cp, sob, or rash.  Has similar abscess to L axillary region in the past.  Denies any recent trauma.    Past Medical History  Diagnosis Date  . Recurrent boils   . Stroke     2011 treated at [facility]  . Headache   . Cerebral embolism with cerebral infarction 09/26/2011    No past surgical history on file.  No family history on file.  History  Substance Use Topics  . Smoking status: Current Everyday Smoker -- 1.0 packs/day    Types: Cigarettes  . Smokeless tobacco: Not on file  . Alcohol Use: Yes      Review of Systems  Constitutional: Negative for fever.  Musculoskeletal: Negative for joint swelling.  Skin: Negative for rash.  Neurological: Negative for numbness.    Allergies  Codeine  Home Medications   Current Outpatient Rx  Name Route Sig Dispense Refill  . ASPIRIN 81 MG PO CHEW Oral Chew 81 mg by mouth daily.        BP 130/78  Pulse 87  Temp 97.5 F (36.4 C) (Oral)  Resp 20  SpO2 99%  Physical Exam  Nursing note and vitals reviewed. Constitutional: He appears well-developed and well-nourished. No distress.  HENT:  Head: Atraumatic.  Mouth/Throat: Oropharynx is clear and moist.  Eyes: Conjunctivae are normal.  Neck: Neck supple.  Cardiovascular: Normal rate and regular rhythm.   Pulmonary/Chest: Effort normal and breath sounds normal. He has no wheezes.  Neurological: He is alert.  Skin:       R axillae: moderate size abscess noted with induration and  fluctuance, no erythema, no rash.  Ttp.      ED Course  Procedures (including critical care time)  Labs Reviewed - No data to display No results found.   No diagnosis found.  INCISION AND DRAINAGE Performed by: Fayrene Helper Consent: Verbal consent obtained. Risks and benefits: risks, benefits and alternatives were discussed Type: abscess  Body area: R axillae  Anesthesia: local infiltration  Local anesthetic: lidocaine 2% w epinephrine  Anesthetic total: 5 ml  Complexity: complex Blunt dissection to break up loculations  Drainage: purulent  Drainage amount: moderate  Packing material: 1/4 in iodoform gauze  Patient tolerance: Patient tolerated the procedure well with no immediate complications.   1. R axillary abscess  MDM  R axillae abscesses amenable to I&D without cellulitis.  Successful I&D with packing placed.  Pt to return in 2 days for reeval.  No abx indicated at this time.  Care instruction including warm compress, wash with soap and water.  Will give tylenol for pain as pt allergic to codeine.    BP 130/78  Pulse 87  Temp 97.5 F (36.4 C) (Oral)  Resp 20  SpO2 99%      Fayrene Helper, PA-C 10/08/11 1405

## 2011-10-08 NOTE — ED Provider Notes (Signed)
Medical screening examination/treatment/procedure(s) were performed by non-physician practitioner and as supervising physician I was immediately available for consultation/collaboration.   Charles B. Bernette Mayers, MD 10/08/11 1428

## 2011-10-11 ENCOUNTER — Emergency Department (HOSPITAL_COMMUNITY)
Admission: EM | Admit: 2011-10-11 | Discharge: 2011-10-11 | Discharge status: Against Medical Advice | Payer: Self-pay | Attending: Emergency Medicine | Admitting: Emergency Medicine

## 2011-10-11 ENCOUNTER — Encounter (HOSPITAL_COMMUNITY): Payer: Self-pay | Admitting: *Deleted

## 2011-10-11 DIAGNOSIS — IMO0002 Reserved for concepts with insufficient information to code with codable children: Secondary | ICD-10-CM | POA: Insufficient documentation

## 2011-10-11 NOTE — ED Notes (Signed)
Called for pt no answer

## 2011-10-11 NOTE — ED Notes (Signed)
Unable to locate patient.

## 2011-10-11 NOTE — ED Notes (Signed)
Pt had abscess under right arm that was lanced on Friday and now pain is worse and reports increased smell and drainage.

## 2011-10-11 NOTE — ED Notes (Signed)
NA called for pt - no answer 

## 2011-10-11 NOTE — ED Notes (Signed)
Patient called. Unable to locate at this time. 

## 2012-12-13 ENCOUNTER — Emergency Department (HOSPITAL_COMMUNITY)
Admission: EM | Admit: 2012-12-13 | Discharge: 2012-12-13 | Disposition: A | Discharge status: Home / Self Care | Payer: 59 | Attending: Emergency Medicine | Admitting: Emergency Medicine

## 2012-12-13 ENCOUNTER — Encounter (HOSPITAL_COMMUNITY): Payer: Self-pay | Admitting: Emergency Medicine

## 2012-12-13 DIAGNOSIS — F17200 Nicotine dependence, unspecified, uncomplicated: Secondary | ICD-10-CM | POA: Insufficient documentation

## 2012-12-13 DIAGNOSIS — Z8679 Personal history of other diseases of the circulatory system: Secondary | ICD-10-CM | POA: Insufficient documentation

## 2012-12-13 DIAGNOSIS — L732 Hidradenitis suppurativa: Secondary | ICD-10-CM | POA: Insufficient documentation

## 2012-12-13 DIAGNOSIS — Z7982 Long term (current) use of aspirin: Secondary | ICD-10-CM | POA: Insufficient documentation

## 2012-12-13 DIAGNOSIS — Z872 Personal history of diseases of the skin and subcutaneous tissue: Secondary | ICD-10-CM | POA: Insufficient documentation

## 2012-12-13 DIAGNOSIS — R509 Fever, unspecified: Secondary | ICD-10-CM | POA: Insufficient documentation

## 2012-12-13 DIAGNOSIS — Z8673 Personal history of transient ischemic attack (TIA), and cerebral infarction without residual deficits: Secondary | ICD-10-CM | POA: Insufficient documentation

## 2012-12-13 MED ORDER — OXYCODONE-ACETAMINOPHEN 5-325 MG PO TABS: 2.0000 | ORAL_TABLET | Freq: Four times a day (QID) | ORAL | Status: DC | PRN

## 2012-12-13 MED ORDER — PROMETHAZINE HCL 25 MG PO TABS: 25.0000 mg | ORAL_TABLET | Freq: Four times a day (QID) | ORAL | Status: DC | PRN

## 2012-12-13 MED ORDER — ONDANSETRON 4 MG PO TBDP
8.0000 mg | ORAL_TABLET | Freq: Once | ORAL | Status: AC
  Administered 2012-12-13: 8 mg via ORAL
  Filled 2012-12-13: qty 2

## 2012-12-13 MED ORDER — OXYCODONE-ACETAMINOPHEN 5-325 MG PO TABS
2.0000 | ORAL_TABLET | Freq: Once | ORAL | Status: AC
  Administered 2012-12-13: 2 via ORAL
  Filled 2012-12-13: qty 2

## 2012-12-13 MED ORDER — DOXYCYCLINE HYCLATE 100 MG PO CAPS: 100.0000 mg | ORAL_CAPSULE | Freq: Two times a day (BID) | ORAL | Status: DC

## 2012-12-13 NOTE — ED Notes (Signed)
Pt in c/o abscess to left axilla area since Thursday, history of same in the past, states it was draining but has had increased pain in the last few days

## 2012-12-13 NOTE — ED Provider Notes (Signed)
CSN: [id number]     Arrival date & time 12/13/12  1057 History  This chart was scribed for non-physician practitioner Junious Silk, PA-C working with Laray Anger, DO by Danella Maiers, ED Scribe. This patient was seen in room TR07C/TR07C and the patient's care was started at 11:09 AM.   Chief Complaint  Patient presents with  . Abscess   The history is provided by the patient. No language interpreter was used.   HPI Comments: Derek French is a 47 y.o. male who presents to the Emergency Department complaining of worsening cluster of abscesses to the left axilla with associated pain that started 6 days ago. He reports drainage from the area last week which is no longer present. He has been taking Aleve for the pain, last dose was 7am this morning. This does not alleviate his pain. He states holding his arm up makes the pain worse along with any movement of the arm. He had a mild subjective fever yesterday. He denies chills, nausea, vomiting. He chronically gets these abscesses.  Past Medical History  Diagnosis Date  . Recurrent boils   . Stroke     2011 treated at [facility]  . Headache(784.0)   . Cerebral embolism with cerebral infarction 09/26/2011   History reviewed. No pertinent past surgical history. History reviewed. No pertinent family history. History  Substance Use Topics  . Smoking status: Current Every Day Smoker -- 1.00 packs/day    Types: Cigarettes  . Smokeless tobacco: Not on file  . Alcohol Use: Yes    Review of Systems  Constitutional: Positive for fever. Negative for chills.  Gastrointestinal: Negative for nausea and vomiting.  Skin: Positive for wound.  All other systems reviewed and are negative.    Allergies  Codeine  Home Medications   Current Outpatient Rx  Name  Route  Sig  Dispense  Refill  . aspirin 81 MG chewable tablet   Oral   Chew 81 mg by mouth daily.           . naproxen sodium (ANAPROX) 220 MG tablet   Oral   Take 880 mg by mouth  4 (four) times daily as needed (pain).          BP 131/81  Pulse 72  Temp(Src) 98.5 F (36.9 C) (Oral)  Resp 20  Wt 175 lb (79.379 kg)  BMI 22.46 kg/m2  SpO2 100% Physical Exam  Nursing note and vitals reviewed. Constitutional: He is oriented to person, place, and time. He appears well-developed and well-nourished. No distress.  HENT:  Head: Normocephalic and atraumatic.  Right Ear: External ear normal.  Left Ear: External ear normal.  Nose: Nose normal.  Eyes: Conjunctivae are normal.  Neck: Normal range of motion. No tracheal deviation present.  Cardiovascular: Normal rate, regular rhythm and normal heart sounds.   Pulmonary/Chest: Effort normal and breath sounds normal. No stridor.  Abdominal: Soft. He exhibits no distension. There is no tenderness.  Musculoskeletal: Normal range of motion.  Neurological: He is alert and oriented to person, place, and time.  Skin: Skin is warm and dry. He is not diaphoretic.  Multiple areas of induration without fluctuance or drainage to the left axilla. No drainable abscess at this time. No erythema or streaking.   Psychiatric: He has a normal mood and affect. His behavior is normal.    ED Course  Procedures (including critical care time) Medications - No data to display  DIAGNOSTIC STUDIES: Oxygen Saturation is 100% on RA, normal by my  interpretation.    COORDINATION OF CARE: 11:56 AM- Discussed treatment plan with pt which includes antibiotics and f/u with surgeon. Pt agrees to plan.  Labs Review Labs Reviewed - No data to display Imaging Review No results found.  EKG Interpretation   None       MDM   1. Hidradenitis suppurativa    Patient presents with hidradenitis suppurativa. No drainable abscess today. Will d/c home with doxycyline and pain medication. Instructed that he must follow up with surgery. Afebrile in ED. Return instructions given. Vital signs stable for discharge. Patient / Family / Caregiver informed of  clinical course, understand medical decision-making process, and agree with plan.     I personally performed the services described in this documentation, which was scribed in my presence. The recorded information has been reviewed and is accurate.     Mora Bellman, PA-C 12/13/12 1219

## 2012-12-14 NOTE — ED Provider Notes (Signed)
Medical screening examination/treatment/procedure(s) were performed by non-physician practitioner and as supervising physician I was immediately available for consultation/collaboration.  EKG Interpretation   None         Laray Anger, DO 12/14/12 1523

## 2014-09-15 ENCOUNTER — Emergency Department (HOSPITAL_COMMUNITY)
Admission: EM | Admit: 2014-09-15 | Discharge: 2014-09-15 | Disposition: A | Discharge status: Home / Self Care | Payer: 59 | Attending: Emergency Medicine | Admitting: Emergency Medicine

## 2014-09-15 ENCOUNTER — Encounter (HOSPITAL_COMMUNITY): Payer: Self-pay | Admitting: Nurse Practitioner

## 2014-09-15 DIAGNOSIS — Z79899 Other long term (current) drug therapy: Secondary | ICD-10-CM | POA: Insufficient documentation

## 2014-09-15 DIAGNOSIS — Z720 Tobacco use: Secondary | ICD-10-CM | POA: Insufficient documentation

## 2014-09-15 DIAGNOSIS — L02419 Cutaneous abscess of limb, unspecified: Secondary | ICD-10-CM

## 2014-09-15 DIAGNOSIS — Z8673 Personal history of transient ischemic attack (TIA), and cerebral infarction without residual deficits: Secondary | ICD-10-CM | POA: Insufficient documentation

## 2014-09-15 DIAGNOSIS — M791 Myalgia: Secondary | ICD-10-CM | POA: Insufficient documentation

## 2014-09-15 DIAGNOSIS — R509 Fever, unspecified: Secondary | ICD-10-CM | POA: Insufficient documentation

## 2014-09-15 DIAGNOSIS — L02411 Cutaneous abscess of right axilla: Secondary | ICD-10-CM | POA: Insufficient documentation

## 2014-09-15 DIAGNOSIS — Z7982 Long term (current) use of aspirin: Secondary | ICD-10-CM | POA: Insufficient documentation

## 2014-09-15 MED ORDER — ONDANSETRON 4 MG PO TBDP
8.0000 mg | ORAL_TABLET | Freq: Once | ORAL | Status: AC
  Administered 2014-09-15: 8 mg via ORAL
  Filled 2014-09-15: qty 2

## 2014-09-15 MED ORDER — IBUPROFEN 800 MG PO TABS: 800.0000 mg | ORAL_TABLET | Freq: Three times a day (TID) | ORAL | Status: DC | PRN

## 2014-09-15 MED ORDER — HYDROCODONE-ACETAMINOPHEN 5-325 MG PO TABS
1.0000 | ORAL_TABLET | Freq: Once | ORAL | Status: AC
  Administered 2014-09-15: 1 via ORAL
  Filled 2014-09-15: qty 1

## 2014-09-15 MED ORDER — SULFAMETHOXAZOLE-TRIMETHOPRIM 800-160 MG PO TABS: 1.0000 | ORAL_TABLET | Freq: Two times a day (BID) | ORAL | Status: AC

## 2014-09-15 MED ORDER — HYDROMORPHONE HCL 1 MG/ML IJ SOLN
2.0000 mg | Freq: Once | INTRAMUSCULAR | Status: AC
  Administered 2014-09-15: 2 mg via INTRAMUSCULAR
  Filled 2014-09-15: qty 2

## 2014-09-15 MED ORDER — LIDOCAINE-EPINEPHRINE (PF) 2 %-1:200000 IJ SOLN
20.0000 mL | Freq: Once | INTRAMUSCULAR | Status: AC
  Administered 2014-09-15: 20 mL
  Filled 2014-09-15: qty 20

## 2014-09-15 MED ORDER — HYDROCODONE-ACETAMINOPHEN 5-325 MG PO TABS: 1.0000 | ORAL_TABLET | ORAL | Status: DC | PRN

## 2014-09-15 NOTE — ED Provider Notes (Signed)
CSN: [id number]     Arrival date & time 09/15/14  1310 History  This chart was scribed for non-physician practitioner, Trixie Dredge, PA-C working with Richardean Canal, MD by Placido Sou, ED scribe. This patient was seen in room TR07C/TR07C and the patient's care was started at 1:56 PM.    Chief Complaint  Patient presents with  . Skin Problem   The history is provided by the patient. No language interpreter was used.   HPI Comments: Derek French is a 48 y.o. male, with a hx of recurrent boils, who presents to the Emergency Department complaining of worsening, moderate, swelling and pain to his right underarm with onset 3 days ago. He notes an associated, intermittent, fever (TMAX 101). Pt notes that he has taken tylenol applied hot compresses to the affected site with no relief and denies experiencing any drainage from the location. He notes that though he has a history of a boils he denies ever having one in this region or of this size. Pt notes an allergy to codeine which causes him to hallucinate. He denies any other known drug allergies. He denies SOB, CP or body aches.  Denies weakness or numbness of his right arm.   Past Medical History  Diagnosis Date  . Recurrent boils   . Stroke     2011 treated at [facility]  . Headache(784.0)   . Cerebral embolism with cerebral infarction 09/26/2011   History reviewed. No pertinent past surgical history. History reviewed. No pertinent family history. History  Substance Use Topics  . Smoking status: Current Every Day Smoker -- 1.00 packs/day    Types: Cigarettes  . Smokeless tobacco: Not on file  . Alcohol Use: Yes    Review of Systems  Constitutional: Positive for fever and chills.  Respiratory: Negative for shortness of breath.   Cardiovascular: Negative for chest pain.  Musculoskeletal: Positive for myalgias.  Skin: Positive for color change.  Allergic/Immunologic: Negative for immunocompromised state.  Hematological: Does not bruise/bleed  easily.  Psychiatric/Behavioral: Negative for confusion.    Allergies  Codeine  Home Medications   Prior to Admission medications   Medication Sig Start Date End Date Taking? Authorizing Provider  aspirin 81 MG chewable tablet Chew 81 mg by mouth daily.      Historical Provider, MD  doxycycline (VIBRAMYCIN) 100 MG capsule Take 1 capsule (100 mg total) by mouth 2 (two) times daily. 12/13/12   Junious Silk, PA-C  naproxen sodium (ANAPROX) 220 MG tablet Take 880 mg by mouth 4 (four) times daily as needed (pain).    Historical Provider, MD  oxyCODONE-acetaminophen (PERCOCET/ROXICET) 5-325 MG per tablet Take 2 tablets by mouth every 6 (six) hours as needed for pain. 12/13/12   Junious Silk, PA-C  promethazine (PHENERGAN) 25 MG tablet Take 1 tablet (25 mg total) by mouth every 6 (six) hours as needed for nausea. 12/13/12   Junious Silk, PA-C   BP 142/78 mmHg  Pulse 90  Temp(Src) 97.6 F (36.4 C) (Oral)  Resp 20  Ht 6' 2.5" (1.892 m)  Wt 175 lb (79.379 kg)  BMI 22.17 kg/m2  SpO2 97% Physical Exam  Constitutional: He appears well-developed and well-nourished. No distress.  HENT:  Head: Normocephalic and atraumatic.  Neck: Neck supple.  Pulmonary/Chest: Effort normal. He exhibits no tenderness.  Neurological: He is alert. He exhibits normal muscle tone.  Skin: He is not diaphoretic. There is erythema.  Right axilla with large tender lobular area of induration; overlying erythema; no discharge.  Nursing  note and vitals reviewed.   ED Course  Procedures  DIAGNOSTIC STUDIES: Oxygen Saturation is 97% on RA, normal by my interpretation.    COORDINATION OF CARE: 1:59 PM Discussed treatment plan with pt at bedside and pt agreed to plan.  Labs Review Labs Reviewed - No data to display  Imaging Review No results found.   EKG Interpretation None       INCISION AND DRAINAGE Performed by: Trixie Dredge Consent: Verbal consent obtained. Risks and benefits: risks, benefits  and alternatives were discussed Type: abscess  Body area: right axilla  Anesthesia: local infiltration  Incision was made with a scalpel.  Local anesthetic: lidocaine 2% with epinephrine  Anesthetic total: 6 ml  Complexity: complex Blunt dissection to break up loculations  Drainage: purulent  Drainage amount: moderate  Packing material: none  Abscess cavity irrigated.  Patient tolerance: Patient tolerated the procedure well with no immediate complications.     MDM   Final diagnoses:  Axillary abscess    Afebrile, nontoxic patient with single abscess of right axilla with overlying mild erythema.  I&D'd in department.  D/C home with pain medication, bactrim, return precautions.  Discussed result, findings, treatment, and follow up  with patient.  Pt given return precautions.  Pt verbalizes understanding and agrees with plan.       I personally performed the services described in this documentation, which was scribed in my presence. The recorded information has been reviewed and is accurate.    Trixie Dredge, PA-C 09/15/14 1650  Richardean Canal, MD 09/16/14 [phone]

## 2014-09-15 NOTE — Discharge Instructions (Signed)
Read the information below.  Use the prescribed medication as directed.  Please discuss all new medications with your pharmacist.  Do not take additional tylenol while taking the prescribed pain medication to avoid overdose.  You may return to the Emergency Department at any time for worsening condition or any new symptoms that concern you.  If you develop redness, swelling, uncontrolled pain, or fevers greater than 100.4, return to the ER immediately for a recheck.   ° ° °Abscess °Care After °An abscess (also called a boil or furuncle) is an infected area that contains a collection of pus. Signs and symptoms of an abscess include pain, tenderness, redness, or hardness, or you may feel a moveable soft area under your skin. An abscess can occur anywhere in the body. The infection may spread to surrounding tissues causing cellulitis. A cut (incision) by the surgeon was made over your abscess and the pus was drained out. Gauze may have been packed into the space to provide a drain that will allow the cavity to heal from the inside outwards. The boil may be painful for 5 to 7 days. Most people with a boil do not have high fevers. Your abscess, if seen early, may not have localized, and may not have been lanced. If not, another appointment may be required for this if it does not get better on its own or with medications. °HOME CARE INSTRUCTIONS  °· Only take over-the-counter or prescription medicines for pain, discomfort, or fever as directed by your caregiver. °· When you bathe, soak and then remove gauze or iodoform packs at least daily or as directed by your caregiver. You may then wash the wound gently with mild soapy water. Repack with gauze or do as your caregiver directs. °SEEK IMMEDIATE MEDICAL CARE IF:  °· You develop increased pain, swelling, redness, drainage, or bleeding in the wound site. °· You develop signs of generalized infection including muscle aches, chills, fever, or a general ill feeling. °· An oral  temperature above 102° F (38.9° C) develops, not controlled by medication. °See your caregiver for a recheck if you develop any of the symptoms described above. If medications (antibiotics) were prescribed, take them as directed. °Document Released: 08/27/2004 Document Revised: 05/03/2011 Document Reviewed: 04/24/2007 °Exit[employer]® Patient Information ©2015 [employer]. This information is not intended to replace advice given to you by your health care provider. Make sure you discuss any questions you have with your health care provider. ° °Abscess °An abscess is an infected area that contains a collection of pus and debris. It can occur in almost any part of the body. An abscess is also known as a furuncle or boil. °CAUSES  °An abscess occurs when tissue gets infected. This can occur from blockage of oil or sweat glands, infection of hair follicles, or a minor injury to the skin. As the body tries to fight the infection, pus collects in the area and creates pressure under the skin. This pressure causes pain. People with weakened immune systems have difficulty fighting infections and get certain abscesses more often.  °SYMPTOMS °Usually an abscess develops on the skin and becomes a painful mass that is red, warm, and tender. If the abscess forms under the skin, you may feel a moveable soft area under the skin. Some abscesses break open (rupture) on their own, but most will continue to get worse without care. The infection can spread deeper into the body and eventually into the bloodstream, causing you to feel ill.  °DIAGNOSIS  °Your caregiver   caregiver will take your medical history and perform a physical exam. A sample of fluid may also be taken from the abscess to determine what is causing your infection. TREATMENT  Your caregiver may prescribe antibiotic medicines to fight the infection. However, taking antibiotics alone usually does not cure an abscess. Your caregiver may need to make a small cut (incision) in the  abscess to drain the pus. In some cases, gauze is packed into the abscess to reduce pain and to continue draining the area. HOME CARE INSTRUCTIONS   Only take over-the-counter or prescription medicines for pain, discomfort, or fever as directed by your caregiver.  If you were prescribed antibiotics, take them as directed. Finish them even if you start to feel better.  If gauze is used, follow your caregiver's directions for changing the gauze.  To avoid spreading the infection:  Keep your draining abscess covered with a bandage.  Wash your hands well.  Do not share personal care items, towels, or whirlpools with others.  Avoid skin contact with others.  Keep your skin and clothes clean around the abscess.  Keep all follow-up appointments as directed by your caregiver. SEEK MEDICAL CARE IF:   You have increased pain, swelling, redness, fluid drainage, or bleeding.  You have muscle aches, chills, or a general ill feeling.  You have a fever. MAKE SURE YOU:   Understand these instructions.  Will watch your condition.  Will get help right away if you are not doing well or get worse. Document Released: 11/18/2004 Document Revised: 08/10/2011 Document Reviewed: 04/23/2011 [facility] Patient Information 2015 [place], [place]. This information is not intended to replace advice given to you by your health care provider. Make sure you discuss any questions you have with your health care provider.

## 2014-09-15 NOTE — ED Notes (Signed)
Pt is in stable condition upon d/c and ambulates from ED. 

## 2014-09-15 NOTE — ED Notes (Signed)
He c/o painful swollen abscess under R armpit x 3 days.

## 2014-11-18 ENCOUNTER — Emergency Department (HOSPITAL_COMMUNITY): Payer: 59

## 2014-11-18 ENCOUNTER — Encounter (HOSPITAL_COMMUNITY): Payer: Self-pay | Admitting: Family Medicine

## 2014-11-18 ENCOUNTER — Emergency Department (HOSPITAL_COMMUNITY)
Admission: EM | Admit: 2014-11-18 | Discharge: 2014-11-18 | Disposition: A | Discharge status: Home / Self Care | Payer: 59 | Attending: Emergency Medicine | Admitting: Emergency Medicine

## 2014-11-18 DIAGNOSIS — Z720 Tobacco use: Secondary | ICD-10-CM | POA: Insufficient documentation

## 2014-11-18 DIAGNOSIS — R1031 Right lower quadrant pain: Secondary | ICD-10-CM | POA: Insufficient documentation

## 2014-11-18 DIAGNOSIS — Z872 Personal history of diseases of the skin and subcutaneous tissue: Secondary | ICD-10-CM | POA: Insufficient documentation

## 2014-11-18 DIAGNOSIS — Z7982 Long term (current) use of aspirin: Secondary | ICD-10-CM | POA: Insufficient documentation

## 2014-11-18 DIAGNOSIS — Z8673 Personal history of transient ischemic attack (TIA), and cerebral infarction without residual deficits: Secondary | ICD-10-CM | POA: Insufficient documentation

## 2014-11-18 DIAGNOSIS — N410 Acute prostatitis: Secondary | ICD-10-CM

## 2014-11-18 DIAGNOSIS — Z8679 Personal history of other diseases of the circulatory system: Secondary | ICD-10-CM | POA: Insufficient documentation

## 2014-11-18 LAB — URINALYSIS, ROUTINE W REFLEX MICROSCOPIC
BILIRUBIN URINE: NEGATIVE
Glucose, UA: NEGATIVE mg/dL
Hgb urine dipstick: NEGATIVE
KETONES UR: NEGATIVE mg/dL
LEUKOCYTES UA: NEGATIVE
NITRITE: NEGATIVE
Protein, ur: NEGATIVE mg/dL
Specific Gravity, Urine: >1.046 — ABNORMAL HIGH (ref 1.005–1.030)
UROBILINOGEN UA: 0.2 mg/dL (ref 0.0–1.0)
pH: 5.5 (ref 5.0–8.0)

## 2014-11-18 LAB — CBC
HCT: 43.3 % (ref 39.0–52.0)
HEMOGLOBIN: 15.1 g/dL (ref 13.0–17.0)
MCH: 32.3 pg (ref 26.0–34.0)
MCHC: 34.9 g/dL (ref 30.0–36.0)
MCV: 92.7 fL (ref 78.0–100.0)
PLATELETS: 289 10*3/uL (ref 150–400)
RBC: 4.67 MIL/uL (ref 4.22–5.81)
RDW: 14 % (ref 11.5–15.5)
WBC: 8.3 10*3/uL (ref 4.0–10.5)

## 2014-11-18 LAB — COMPREHENSIVE METABOLIC PANEL
ALT: 15 U/L — AB (ref 17–63)
ANION GAP: 6 (ref 5–15)
AST: 17 U/L (ref 15–41)
Albumin: 3.4 g/dL — ABNORMAL LOW (ref 3.5–5.0)
Alkaline Phosphatase: 55 U/L (ref 38–126)
BUN: 10 mg/dL (ref 6–20)
CALCIUM: 8.9 mg/dL (ref 8.9–10.3)
CHLORIDE: 112 mmol/L — AB (ref 101–111)
CO2: 24 mmol/L (ref 22–32)
CREATININE: 0.94 mg/dL (ref 0.61–1.24)
Glucose, Bld: 111 mg/dL — ABNORMAL HIGH (ref 65–99)
Potassium: 3.7 mmol/L (ref 3.5–5.1)
SODIUM: 142 mmol/L (ref 135–145)
Total Bilirubin: 0.7 mg/dL (ref 0.3–1.2)
Total Protein: 6.5 g/dL (ref 6.5–8.1)

## 2014-11-18 LAB — POC OCCULT BLOOD, ED: FECAL OCCULT BLD: NEGATIVE

## 2014-11-18 MED ORDER — HYDROCODONE-ACETAMINOPHEN 5-325 MG PO TABS: 1.0000 | ORAL_TABLET | Freq: Four times a day (QID) | ORAL | Status: DC | PRN

## 2014-11-18 MED ORDER — HYDROMORPHONE HCL 1 MG/ML IJ SOLN
1.0000 mg | Freq: Once | INTRAMUSCULAR | Status: AC
  Administered 2014-11-18: 1 mg via INTRAVENOUS
  Filled 2014-11-18: qty 1

## 2014-11-18 MED ORDER — CIPROFLOXACIN HCL 500 MG PO TABS: 500.0000 mg | ORAL_TABLET | Freq: Two times a day (BID) | ORAL | Status: DC

## 2014-11-18 MED ORDER — SODIUM CHLORIDE 0.9 % IV BOLUS (SEPSIS)
1000.0000 mL | Freq: Once | INTRAVENOUS | Status: AC
  Administered 2014-11-18: 1000 mL via INTRAVENOUS

## 2014-11-18 MED ORDER — ONDANSETRON HCL 4 MG/2ML IJ SOLN
4.0000 mg | Freq: Once | INTRAMUSCULAR | Status: AC
  Administered 2014-11-18: 4 mg via INTRAVENOUS
  Filled 2014-11-18: qty 2

## 2014-11-18 MED ORDER — CIPROFLOXACIN HCL 500 MG PO TABS
500.0000 mg | ORAL_TABLET | Freq: Once | ORAL | Status: AC
  Administered 2014-11-18: 500 mg via ORAL
  Filled 2014-11-18: qty 1

## 2014-11-18 MED ORDER — IOHEXOL 300 MG/ML  SOLN
25.0000 mL | Freq: Once | INTRAMUSCULAR | Status: AC | PRN
  Administered 2014-11-18: 25 mL via ORAL

## 2014-11-18 MED ORDER — IOHEXOL 300 MG/ML  SOLN
100.0000 mL | Freq: Once | INTRAMUSCULAR | Status: AC | PRN
  Administered 2014-11-18: 100 mL via INTRAVENOUS

## 2014-11-18 NOTE — ED Provider Notes (Addendum)
CSN: [id number]     Arrival date & time 11/18/14  0802 History   First MD Initiated Contact with Patient 11/18/14 [phone]     Chief Complaint  Patient presents with  . Rectal Bleeding  . Abdominal Pain     (Consider location/radiation/quality/duration/timing/severity/associated sxs/prior Treatment) HPI Comments: Patient is a 49 year old male with a history of stroke related to cocaine use who works as a Corporate investment banker and has been doing relatively well until one week ago. He started noticing a small amount of blood in his stool which abruptly worsened yesterday. He complains of significant tenderness posterior to his scrotum and when he has the urge to have a bowel movement he passed dark blood clots last night and this morning. Over the last week he has had abdominal pain that is worse with eating with at least one episode of vomiting daily. He denies fever. Stool has remained a normal color. No prior history of GI bleeding. Patient does take an 81 mg aspirin daily. He has not used cocaine for the last 2 weeks. He drinks 2-3 beers on the weekends but does not drink alcohol daily. No prior colonoscopy. And no dysuria, pain or swelling to the scrotum or penile discharge.  Patient is a 49 y.o. male presenting with hematochezia and abdominal pain. The history is provided by the patient.  Rectal Bleeding Associated symptoms: abdominal pain   Abdominal Pain Associated symptoms: hematochezia     Past Medical History  Diagnosis Date  . Recurrent boils   . Stroke     2011 treated at [facility]  . Headache(784.0)   . Cerebral embolism with cerebral infarction 09/26/2011   History reviewed. No pertinent past surgical history. History reviewed. No pertinent family history. Social History  Substance Use Topics  . Smoking status: Current Every Day Smoker -- 1.00 packs/day    Types: Cigarettes  . Smokeless tobacco: None  . Alcohol Use: Yes    Review of Systems  Gastrointestinal: Positive for  abdominal pain and hematochezia.  All other systems reviewed and are negative.     Allergies  Codeine  Home Medications   Prior to Admission medications   Medication Sig Start Date End Date Taking? Authorizing Valton Schwartz  aspirin 81 MG chewable tablet Chew 81 mg by mouth daily.     Yes Historical Jalen Oberry, MD  ibuprofen (ADVIL,MOTRIN) 800 MG tablet Take 1 tablet (800 mg total) by mouth every 8 (eight) hours as needed for mild pain or moderate pain. Patient not taking: Reported on 11/18/2014 09/15/14   Trixie Dredge, PA-C   BP 150/92 mmHg  Pulse 59  Temp(Src) 97.4 F (36.3 C) (Oral)  Resp 20  SpO2 92% Physical Exam  Constitutional: He is oriented to person, place, and time. He appears well-developed and well-nourished. No distress.  HENT:  Head: Normocephalic and atraumatic.  Mouth/Throat: Oropharynx is clear and moist.  Eyes: Conjunctivae and EOM are normal. Pupils are equal, round, and reactive to light.  Neck: Normal range of motion. Neck supple.  Cardiovascular: Normal rate, regular rhythm and intact distal pulses.   No murmur heard. Pulmonary/Chest: Effort normal and breath sounds normal. No respiratory distress. He has no wheezes. He has no rales.  Abdominal: Soft. He exhibits no distension. There is tenderness in the right lower quadrant, suprapubic area and left lower quadrant. There is guarding. There is no rebound.  Genitourinary: Rectum normal, testes normal and penis normal. Rectal exam shows no external hemorrhoid. Guaiac negative stool. Prostate is enlarged and tender.  Musculoskeletal:  Normal range of motion. He exhibits no edema or tenderness.  Neurological: He is alert and oriented to person, place, and time.  Skin: Skin is warm and dry. No rash noted. No erythema.  Psychiatric: He has a normal mood and affect. His behavior is normal.  Nursing note and vitals reviewed.   ED Course  Procedures (including critical care time) Labs Review Labs Reviewed   COMPREHENSIVE METABOLIC PANEL - Abnormal; Notable for the following:    Chloride 112 (*)    Glucose, Bld 111 (*)    Albumin 3.4 (*)    ALT 15 (*)    All other components within normal limits  CBC  URINALYSIS, ROUTINE W REFLEX MICROSCOPIC (NOT AT [facility])  POC OCCULT BLOOD, ED  POC OCCULT BLOOD, ED    Imaging Review Ct Abdomen Pelvis W Contrast  11/18/2014   CLINICAL DATA:  Lower abdominal pain for 2 weeks.  EXAM: CT ABDOMEN AND PELVIS WITH CONTRAST  TECHNIQUE: Multidetector CT imaging of the abdomen and pelvis was performed using the standard protocol following bolus administration of intravenous contrast.  CONTRAST:  OMNIPAQUE IOHEXOL 300 MG/ML SOLN, 25mL OMNIPAQUE IOHEXOL 300 MG/ML SOLN  COMPARISON:  None.  FINDINGS: Visualized lung bases appear normal. No significant osseous abnormality is noted.  No gallstones are noted. The liver, spleen and pancreas appear normal. Adrenal glands and kidneys appear normal. No hydronephrosis or renal obstruction is noted. No renal or ureteral calculi are noted. The appendix appears normal. There is no evidence of bowel obstruction. Atherosclerosis of abdominal aorta is noted without aneurysm formation. Urinary bladder appears normal. Prostate gland is unremarkable. No abnormal fluid collection is noted. No significant adenopathy is noted.  IMPRESSION: No significant abnormality seen in the abdomen or pelvis.   Electronically Signed   By: Lupita Raider, M.D.   On: 11/18/2014 11:40   I have personally reviewed and evaluated these images and lab results as part of my medical decision-making.   EKG Interpretation None      MDM   Final diagnoses:  Prostatitis, acute    Patient with bloody stool and abdominal pain that's worsened over the last 1 week. The pain is always worse with eating and there are no urinary symptoms. On exam patient has no evidence of hemorrhoids but does have a very tender prostate that slightly enlarged. Low suspicion for  testicular torsion or urethritis. Patient does have significant lower abdominal pain on the right and left quadrant with guarding. Concern for atypical appendicitis versus diverticulitis.  Vital signs are stable. CMP, CBC, Hemoccults without acute findings. Patient given IV fluids, pain and nausea medication. CT of the abdomen and pelvis with contrast pending  12:23 PM CT without acute findings. Heme occult is negative. Based on patient's exam he had significant tenderness of his prostate with the pain posterior to the scrotum that makes the most sense. Will treat with Cipro and pain control.  Gwyneth Sprout, MD 11/18/14 1224  Gwyneth Sprout, MD 11/18/14 1306

## 2014-11-18 NOTE — ED Notes (Signed)
Pt here for lower abd pain since yesterday. sts blood clots with BM. sts the same 2 weeks ago but no pain and it went away.

## 2014-11-18 NOTE — ED Notes (Signed)
NAD at this time. Pt is stable and going home.  

## 2014-11-18 NOTE — ED Notes (Signed)
MD at bedside. 

## 2014-11-18 NOTE — ED Notes (Signed)
Pt in CT.

## 2014-11-18 NOTE — Discharge Instructions (Signed)

## 2014-11-18 NOTE — ED Notes (Signed)
Ct informed contrast complete

## 2014-11-25 ENCOUNTER — Emergency Department (HOSPITAL_COMMUNITY): Payer: 59

## 2014-11-25 ENCOUNTER — Emergency Department (HOSPITAL_COMMUNITY)
Admission: EM | Admit: 2014-11-25 | Discharge: 2014-11-25 | Disposition: A | Discharge status: Home / Self Care | Payer: 59 | Attending: Emergency Medicine | Admitting: Emergency Medicine

## 2014-11-25 ENCOUNTER — Encounter (HOSPITAL_COMMUNITY): Payer: Self-pay | Admitting: *Deleted

## 2014-11-25 DIAGNOSIS — K921 Melena: Secondary | ICD-10-CM | POA: Insufficient documentation

## 2014-11-25 DIAGNOSIS — N50812 Left testicular pain: Secondary | ICD-10-CM | POA: Insufficient documentation

## 2014-11-25 DIAGNOSIS — Z792 Long term (current) use of antibiotics: Secondary | ICD-10-CM | POA: Insufficient documentation

## 2014-11-25 DIAGNOSIS — Z8673 Personal history of transient ischemic attack (TIA), and cerebral infarction without residual deficits: Secondary | ICD-10-CM | POA: Insufficient documentation

## 2014-11-25 DIAGNOSIS — N5082 Scrotal pain: Secondary | ICD-10-CM

## 2014-11-25 DIAGNOSIS — Z7982 Long term (current) use of aspirin: Secondary | ICD-10-CM | POA: Insufficient documentation

## 2014-11-25 DIAGNOSIS — N50811 Right testicular pain: Secondary | ICD-10-CM | POA: Insufficient documentation

## 2014-11-25 DIAGNOSIS — R350 Frequency of micturition: Secondary | ICD-10-CM | POA: Insufficient documentation

## 2014-11-25 DIAGNOSIS — Z720 Tobacco use: Secondary | ICD-10-CM | POA: Insufficient documentation

## 2014-11-25 DIAGNOSIS — K6289 Other specified diseases of anus and rectum: Secondary | ICD-10-CM

## 2014-11-25 LAB — URINALYSIS, ROUTINE W REFLEX MICROSCOPIC
BILIRUBIN URINE: NEGATIVE
Glucose, UA: NEGATIVE mg/dL
HGB URINE DIPSTICK: NEGATIVE
KETONES UR: NEGATIVE mg/dL
Leukocytes, UA: NEGATIVE
NITRITE: NEGATIVE
PH: 5 (ref 5.0–8.0)
Protein, ur: NEGATIVE mg/dL
SPECIFIC GRAVITY, URINE: 1.021 (ref 1.005–1.030)
Urobilinogen, UA: 0.2 mg/dL (ref 0.0–1.0)

## 2014-11-25 MED ORDER — OXYCODONE-ACETAMINOPHEN 5-325 MG PO TABS
1.0000 | ORAL_TABLET | Freq: Once | ORAL | Status: AC
  Administered 2014-11-25: 1 via ORAL
  Filled 2014-11-25: qty 1

## 2014-11-25 MED ORDER — BELLADONNA ALKALOIDS-OPIUM 16.2-60 MG RE SUPP
1.0000 | Freq: Once | RECTAL | Status: AC
  Administered 2014-11-25: 1 via RECTAL
  Filled 2014-11-25: qty 1

## 2014-11-25 MED ORDER — BELLADONNA-OPIUM 16.2-30 MG RE SUPP: 30.0000 mg | Freq: Three times a day (TID) | RECTAL | Status: DC | PRN

## 2014-11-25 MED ORDER — OXYCODONE-ACETAMINOPHEN 5-325 MG PO TABS: 1.0000 | ORAL_TABLET | Freq: Four times a day (QID) | ORAL | Status: DC | PRN

## 2014-11-25 NOTE — ED Provider Notes (Signed)
CSN: [id number]     Arrival date & time 11/25/14  4098 History   First MD Initiated Contact with Patient 11/25/14 [phone]     Chief Complaint  Patient presents with  . Urinary Frequency  . Rectal Bleeding     (Consider location/radiation/quality/duration/timing/severity/associated sxs/prior Treatment) HPI Comments: Patient with remote history of inguinal hernia repair presents with continued pelvic pain. Patient was seen in emergency department one week ago for the same. He had labs and a CT scan. CT scan was negative. Prostate was tender on exam. Patient was treated as prostatitis and discharged home on Cipro twice a day 10 days. He was also given pain medication. To this point, symptoms have not improved. He continues to have scrotal pain. No dysuria, hematuria however has had increased frequency. No reported pain with bowel movements. No fevers, nausea.   Patient states that he has had on/off rectal bleeding, dark red, over the past year. This is at baseline. He has not had previous colonoscopy for f/u for this.    Patient is a 49 y.o. male presenting with frequency and hematochezia. The history is provided by the patient and medical records.  Urinary Frequency Associated symptoms include abdominal pain (lower/pelvis). Pertinent negatives include no chest pain, coughing, fever, headaches, myalgias, nausea, rash, sore throat or vomiting.  Rectal Bleeding Associated symptoms: abdominal pain (lower/pelvis)   Associated symptoms: no fever and no vomiting     Past Medical History  Diagnosis Date  . Recurrent boils   . Stroke [facility])     2011 treated at [facility]  . Headache(784.0)   . Cerebral embolism with cerebral infarction (HCC) 09/26/2011   Past Surgical History  Procedure Laterality Date  . Hernia repair     No family history on file. Social History  Substance Use Topics  . Smoking status: Current Every Day Smoker -- 1.00 packs/day    Types: Cigarettes  . Smokeless tobacco: None    . Alcohol Use: Yes     Comment: occ    Review of Systems  Constitutional: Negative for fever.  HENT: Negative for rhinorrhea and sore throat.   Eyes: Negative for redness.  Respiratory: Negative for cough.   Cardiovascular: Negative for chest pain.  Gastrointestinal: Positive for abdominal pain (lower/pelvis) and hematochezia. Negative for nausea, vomiting, diarrhea and rectal pain.  Genitourinary: Positive for frequency and testicular pain. Negative for dysuria, urgency, hematuria, discharge, penile swelling, scrotal swelling and penile pain.  Musculoskeletal: Negative for myalgias.  Skin: Negative for rash.  Neurological: Negative for headaches.      Allergies  Codeine  Home Medications   Prior to Admission medications   Medication Sig Start Date End Date Taking? Authorizing Provider  aspirin 81 MG chewable tablet Chew 81 mg by mouth daily.      Historical Provider, MD  ciprofloxacin (CIPRO) 500 MG tablet Take 1 tablet (500 mg total) by mouth 2 (two) times daily. 11/18/14   Gwyneth Sprout, MD  HYDROcodone-acetaminophen (NORCO/VICODIN) 5-325 MG per tablet Take 1-2 tablets by mouth every 6 (six) hours as needed. 11/18/14   Gwyneth Sprout, MD  ibuprofen (ADVIL,MOTRIN) 800 MG tablet Take 1 tablet (800 mg total) by mouth every 8 (eight) hours as needed for mild pain or moderate pain. Patient not taking: Reported on 11/18/2014 09/15/14   Trixie Dredge, PA-C   BP 157/106 mmHg  Pulse 70  Temp(Src) 97.4 F (36.3 C) (Oral)  Resp 17  Ht  (1.905 m)  Wt 180 lb (81.647 kg)  BMI 22.50  kg/m2  SpO2 98%   Physical Exam  Constitutional: He appears well-developed and well-nourished.  HENT:  Head: Normocephalic and atraumatic.  Eyes: Conjunctivae are normal. Right eye exhibits no discharge. Left eye exhibits no discharge.  Neck: Normal range of motion. Neck supple.  Cardiovascular: Normal rate, regular rhythm and normal heart sounds.   Pulmonary/Chest: Effort normal and breath  sounds normal.  Abdominal: Soft. There is no tenderness. Hernia confirmed negative in the right inguinal area and confirmed negative in the left inguinal area.  Genitourinary: Penis normal.    Right testis shows tenderness. Right testis shows no mass and no swelling. Right testis is descended. Left testis shows tenderness. Left testis shows no mass and no swelling. Left testis is descended. No discharge found.  Neurological: He is alert.  Skin: Skin is warm and dry.  Psychiatric: He has a normal mood and affect.  Nursing note and vitals reviewed.   ED Course  Procedures (including critical care time) Labs Review Labs Reviewed  URINALYSIS, ROUTINE W REFLEX MICROSCOPIC (NOT AT [facility])    Imaging Review US Scrotum  11/25/2014   CLINICAL DATA:  49 year old male with bilateral scrotal pain for 1 week Subsequent encounter. Suspected recent prostate infection.  EXAM: SCROTAL ULTRASOUND  DOPPLER ULTRASOUND OF THE TESTICLES  TECHNIQUE: Complete ultrasound examination of the testicles, epididymis, and other scrotal structures was performed. Color and spectral Doppler ultrasound were also utilized to evaluate blood flow to the testicles.  COMPARISON:  CT Abdomen and Pelvis 11/18/2014.  FINDINGS: Right testicle  Measurements: 4.2 x 1.5 x 2.5 cm. No mass or microlithiasis visualized.  Left testicle  Measurements: 4.1 x 1.6 x 2.6 cm. No mass or microlithiasis visualized.  Right epididymis:  Normal in size and appearance.  Left epididymis:  Normal in size and appearance.  Hydrocele:  None visualized.  Varicocele:  None visualized.  Pulsed Doppler interrogation of both testes demonstrates normal low resistance arterial and venous waveforms bilaterally. Vascularity appears symmetric (image 21). No hypervascularity identified.  No definite scrotal wall thickening.  IMPRESSION: Negative scrotal ultrasound with Doppler. No evidence of testicular torsion.   Electronically Signed   By: Odessa Fleming M.D.   On: 11/25/2014  11:11   [place] Art/ven Flow Abd Pelv Doppler  11/25/2014   CLINICAL DATA:  49 year old male with bilateral scrotal pain for 1 week Subsequent encounter. Suspected recent prostate infection.  EXAM: SCROTAL ULTRASOUND  DOPPLER ULTRASOUND OF THE TESTICLES  TECHNIQUE: Complete ultrasound examination of the testicles, epididymis, and other scrotal structures was performed. Color and spectral Doppler ultrasound were also utilized to evaluate blood flow to the testicles.  COMPARISON:  CT Abdomen and Pelvis 11/18/2014.  FINDINGS: Right testicle  Measurements: 4.2 x 1.5 x 2.5 cm. No mass or microlithiasis visualized.  Left testicle  Measurements: 4.1 x 1.6 x 2.6 cm. No mass or microlithiasis visualized.  Right epididymis:  Normal in size and appearance.  Left epididymis:  Normal in size and appearance.  Hydrocele:  None visualized.  Varicocele:  None visualized.  Pulsed Doppler interrogation of both testes demonstrates normal low resistance arterial and venous waveforms bilaterally. Vascularity appears symmetric (image 21). No hypervascularity identified.  No definite scrotal wall thickening.  IMPRESSION: Negative scrotal ultrasound with Doppler. No evidence of testicular torsion.   Electronically Signed   By: Odessa Fleming M.D.   On: 11/25/2014 11:11   I have personally reviewed and evaluated these images and lab results as part of my medical decision-making.   EKG Interpretation None  10:53 AM Patient seen and examined. Work-up initiated. Medications ordered.   Vital signs reviewed and are as follows: BP 157/106 mmHg  Pulse 70  Temp(Src) 97.4 F (36.3 C) (Oral)  Resp 17  Ht  (1.905 m)  Wt 180 lb (81.647 kg)  BMI 22.50 kg/m2  SpO2 98%  3:17 PM Patient discussed with and seen by Dr. Jeraldine Loots.   B&O suppository ordered. This helped improve symptoms. Will d/c to home with PCP and GI referral.   Patient urged to return with worsening symptoms or other concerns. Patient verbalized understanding and  agrees with plan.    MDM   Final diagnoses:  Rectal pain   Patient with rectal/pelvic pain. Currently treated for prostatitis however Cipro 1 week has not seemed to improve his symptoms. CT performed previously was negative. Patient has not had fever or other systemic symptoms. Urine is clear today. Scrotal pain on my exam, negative ultrasound. Patient did have some relief with venous suppository. Given his rectal bleeding history, feel that GI follow-up is indicated. He will likely need colonoscopy. Do not suspect large GI bleed given description. No suspicion for anemia. Patient appears well, nontoxic.   Renne Crigler, PA-C 11/25/14 1522  Gerhard Munch, MD 11/26/14 [phone]

## 2014-11-25 NOTE — ED Notes (Signed)
Last Monday diagnosed with infection in prostate, placed on abx and pain medication.  Pt states still having some rectal bleeding and pressure.

## 2014-11-25 NOTE — Discharge Instructions (Signed)
Please read and follow all provided instructions.  Your diagnoses today include:  1. Rectal pain   2. Scrotal pain    Tests performed today include:  Scrotal ultrasound - no problems  Urine test - no urinary tract infection  Vital signs. See below for your results today.   Medications prescribed:   Percocet (oxycodone/acetaminophen) - narcotic pain medication  DO NOT drive or perform any activities that require you to be awake and alert because this medicine can make you drowsy. BE VERY CAREFUL not to take multiple medicines containing Tylenol (also called acetaminophen). Doing so can lead to an overdose which can damage your liver and cause liver failure and possibly death.  Take any prescribed medications only as directed.  Home care instructions:  Follow any educational materials contained in this packet.  BE VERY CAREFUL not to take multiple medicines containing Tylenol (also called acetaminophen). Doing so can lead to an overdose which can damage your liver and cause liver failure and possibly death.   Follow-up instructions: Please follow-up with your primary care provider in the next 3 days for further evaluation of your symptoms.   Return instructions:   Please return to the Emergency Department if you experience worsening symptoms.   Please return if you have any other emergent concerns.  Additional Information:  Your vital signs today were: BP 143/96 mmHg   Pulse 55   Temp(Src) 97.4 F (36.3 C) (Oral)   Resp 17   Ht  (1.905 m)   Wt 180 lb (81.647 kg)   BMI 22.50 kg/m2   SpO2 98% If your blood pressure (BP) was elevated above 135/85 this visit, please have this repeated by your doctor within one month. --------------

## 2014-11-27 ENCOUNTER — Ambulatory Visit: Payer: 59

## 2015-01-07 ENCOUNTER — Emergency Department (HOSPITAL_COMMUNITY)
Admission: EM | Admit: 2015-01-07 | Discharge: 2015-01-07 | Disposition: A | Discharge status: Home / Self Care | Payer: 59 | Attending: Emergency Medicine | Admitting: Emergency Medicine

## 2015-01-07 ENCOUNTER — Encounter (HOSPITAL_COMMUNITY): Payer: Self-pay | Admitting: Emergency Medicine

## 2015-01-07 DIAGNOSIS — F17210 Nicotine dependence, cigarettes, uncomplicated: Secondary | ICD-10-CM | POA: Insufficient documentation

## 2015-01-07 DIAGNOSIS — L02411 Cutaneous abscess of right axilla: Secondary | ICD-10-CM | POA: Insufficient documentation

## 2015-01-07 DIAGNOSIS — Z7982 Long term (current) use of aspirin: Secondary | ICD-10-CM | POA: Insufficient documentation

## 2015-01-07 DIAGNOSIS — Z8673 Personal history of transient ischemic attack (TIA), and cerebral infarction without residual deficits: Secondary | ICD-10-CM | POA: Insufficient documentation

## 2015-01-07 DIAGNOSIS — Z792 Long term (current) use of antibiotics: Secondary | ICD-10-CM | POA: Insufficient documentation

## 2015-01-07 MED ORDER — LIDOCAINE-EPINEPHRINE 2 %-1:200000 IJ SOLN
20.0000 mL | Freq: Once | INTRAMUSCULAR | Status: AC
Start: 2015-01-07 — End: 2015-01-07
  Administered 2015-01-07: 20 mL via INTRADERMAL
  Filled 2015-01-07: qty 20

## 2015-01-07 MED ORDER — HYDROCODONE-ACETAMINOPHEN 5-325 MG PO TABS: 1.0000 | ORAL_TABLET | Freq: Four times a day (QID) | ORAL | Status: DC | PRN

## 2015-01-07 NOTE — ED Provider Notes (Signed)
CSN: [id number]     Arrival date & time 01/07/15  1116 History  By signing my name below, I, Jarvis Morgan, attest that this documentation has been prepared under the direction and in the presence of Ebbie Ridge, PA-C Electronically Signed: Jarvis Morgan, ED Scribe. 01/07/2015. 12:22 PM.    Chief Complaint  Patient presents with  . Abscess   The history is provided by the patient. No language interpreter was used.    HPI Comments: Derek French is a 49 y.o. male who presents to the Emergency Department complaining of an abscess to his right axillary onset several days. He states the pain in the area is exacerbated with applied pressure and touch to the area. He reports he has had an abscess to this area in the past. Pt notes he has tried to applied pressure to the area and get it to drain on its own with no success. He has not had any medications prior to arrival. He denies any drainage from the area. Pt denies any fever, chills, nausea, vomiting or red streaking.   Past Medical History  Diagnosis Date  . Recurrent boils   . Stroke [facility])     2011 [facility] at [facility]  . Headache(784.0)   . Cerebral embolism with cerebral infarction (HCC) 09/26/2011   Past Surgical History  Procedure Laterality Date  . Hernia repair     History reviewed. No pertinent family history. Social History  Substance Use Topics  . Smoking status: Current Every Day Smoker -- 1.00 packs/day    Types: Cigarettes  . Smokeless tobacco: None  . Alcohol Use: Yes     Comment: occ    Review of Systems 10 Systems reviewed and all are negative for acute change except as noted in the HPI.     Allergies  Codeine  Home Medications   Prior to Admission medications   Medication Sig Start Date End Date Taking? Authorizing Provider  aspirin 81 MG chewable tablet Chew 81 mg by mouth daily.      Historical Provider, MD  belladonna-opium (B&O SUPPRETTES) 16.2-30 MG suppository Place 1 suppository rectally every 8  (eight) hours as needed for pain. 11/25/14   Renne Crigler, PA-C  ciprofloxacin (CIPRO) 500 MG tablet Take 1 tablet (500 mg total) by mouth 2 (two) times daily. 11/18/14   Gwyneth Sprout, MD  ibuprofen (ADVIL,MOTRIN) 800 MG tablet Take 1 tablet (800 mg total) by mouth every 8 (eight) hours as needed for mild pain or moderate pain. 09/15/14   Trixie Dredge, PA-C  oxyCODONE-acetaminophen (PERCOCET/ROXICET) 5-325 MG tablet Take 1-2 tablets by mouth every 6 (six) hours as needed for severe pain. 11/25/14   Renne Crigler, PA-C   Triage Vitals: BP 154/90 mmHg  Pulse 74  Temp(Src) 97.8 F (36.6 C) (Oral)  Resp 18  SpO2 98%  Physical Exam  Constitutional: He is oriented to person, place, and time. He appears well-developed and well-nourished. No distress.  HENT:  Head: Normocephalic and atraumatic.  Eyes: Conjunctivae and EOM are normal.  Neck: Neck supple. No tracheal deviation present.  Cardiovascular: Normal rate.   Pulmonary/Chest: Effort normal. No respiratory distress.  Musculoskeletal: Normal range of motion.  Neurological: He is alert and oriented to person, place, and time.  Skin: Skin is warm and dry.  Small 2x3cm abscess in right axilla. Small area of fluctuance with tenderness  Psychiatric: He has a normal mood and affect. His behavior is normal.  Nursing note and vitals reviewed.   ED Course  Procedures (  including critical care time)  DIAGNOSTIC STUDIES: Oxygen Saturation is 98% on RA, normal by my interpretation.    COORDINATION OF CARE: INCISION AND DRAINAGE PROCEDURE NOTE: Patient identification was confirmed and verbal consent was obtained. This procedure was performed by Arby BarretteMarcy Pfeiffer, MD at 1:29 PM. Site: right axilla Sterile procedures observed Needle size: 25 Anesthetic used (type and amt): 2% lidocaine w/ epi Blade size: 11 Drainage: large Complexity: Complex Packing used: 1/4 inch Site anesthetized, incision made over site, wound drained and  explored loculations, rinsed with copious amounts of normal saline, wound packed with sterile gauze, covered with dry, sterile dressing.  Pt tolerated procedure well without complications.  Instructions for care discussed verbally and pt provided with additional written instructions for homecare and f/u.    Labs Review Labs Reviewed - No data to display  Imaging Review No results found. I have personally reviewed and evaluated these images and lab results as part of my medical decision-making.  I personally performed the services described in this documentation, which was scribed in my presence. The recorded information has been reviewed and is accurate.    Charlestine NightChristopher Olumide Dolinger, PA-C 01/07/15 1331  Arby BarretteMarcy Pfeiffer, MD 01/08/15 [phone]

## 2015-01-07 NOTE — Discharge Instructions (Signed)
Return here as needed.  Have packing out in 2 days.  Keep the area clean and dry, use warm compresses around the area

## 2015-01-07 NOTE — ED Notes (Signed)
Pt c/o abscess under right axillary area x several days with hx of same

## 2015-02-26 ENCOUNTER — Emergency Department (HOSPITAL_COMMUNITY)
Admission: EM | Admit: 2015-02-26 | Discharge: 2015-02-26 | Disposition: A | Discharge status: Home / Self Care | Payer: 59 | Attending: Emergency Medicine | Admitting: Emergency Medicine

## 2015-02-26 ENCOUNTER — Encounter (HOSPITAL_COMMUNITY): Payer: Self-pay | Admitting: Emergency Medicine

## 2015-02-26 DIAGNOSIS — F17210 Nicotine dependence, cigarettes, uncomplicated: Secondary | ICD-10-CM | POA: Insufficient documentation

## 2015-02-26 DIAGNOSIS — L02414 Cutaneous abscess of left upper limb: Secondary | ICD-10-CM

## 2015-02-26 DIAGNOSIS — L02412 Cutaneous abscess of left axilla: Secondary | ICD-10-CM | POA: Insufficient documentation

## 2015-02-26 DIAGNOSIS — Z7982 Long term (current) use of aspirin: Secondary | ICD-10-CM | POA: Insufficient documentation

## 2015-02-26 DIAGNOSIS — Z8673 Personal history of transient ischemic attack (TIA), and cerebral infarction without residual deficits: Secondary | ICD-10-CM | POA: Insufficient documentation

## 2015-02-26 DIAGNOSIS — Z792 Long term (current) use of antibiotics: Secondary | ICD-10-CM | POA: Insufficient documentation

## 2015-02-26 MED ORDER — BACITRACIN ZINC 500 UNIT/GM EX OINT
1.0000 "application " | TOPICAL_OINTMENT | Freq: Two times a day (BID) | CUTANEOUS | Status: DC
  Administered 2015-02-26: 1 via TOPICAL

## 2015-02-26 MED ORDER — OXYCODONE-ACETAMINOPHEN 5-325 MG PO TABS
1.0000 | ORAL_TABLET | Freq: Once | ORAL | Status: AC
  Administered 2015-02-26: 1 via ORAL
  Filled 2015-02-26: qty 1

## 2015-02-26 MED ORDER — LIDOCAINE-EPINEPHRINE (PF) 2 %-1:200000 IJ SOLN: 10.0000 mL | Freq: Once | INTRAMUSCULAR | Status: DC

## 2015-02-26 MED ORDER — LIDOCAINE-EPINEPHRINE 2 %-1:100000 IJ SOLN
INTRAMUSCULAR | Status: AC
  Administered 2015-02-26: 3 mL
  Filled 2015-02-26: qty 1

## 2015-02-26 MED ORDER — HYDROCODONE-ACETAMINOPHEN 5-325 MG PO TABS: 1.0000 | ORAL_TABLET | Freq: Four times a day (QID) | ORAL | Status: DC | PRN

## 2015-02-26 MED ORDER — SULFAMETHOXAZOLE-TRIMETHOPRIM 800-160 MG PO TABS: 1.0000 | ORAL_TABLET | Freq: Two times a day (BID) | ORAL | Status: AC

## 2015-02-26 MED ORDER — SODIUM BICARBONATE 4 % IV SOLN
5.0000 mL | Freq: Once | INTRAVENOUS | Status: AC
  Administered 2015-02-26: 5 mL via SUBCUTANEOUS
  Filled 2015-02-26: qty 5

## 2015-02-26 NOTE — Discharge Instructions (Signed)
Please take your antibiotics as prescribed. Take your pain medicine for breakthrough pain, continue with Motrin and Tylenol for moderate pain. Do not take this pain medication before driving. Return to your doctor or emergency department 3 days for wound recheck and packing removal. Return sooner for any worsening symptoms that we discussed.  Abscess An abscess is an infected area that contains a collection of pus and debris.It can occur in almost any part of the body. An abscess is also known as a furuncle or boil. CAUSES  An abscess occurs when tissue gets infected. This can occur from blockage of oil or sweat glands, infection of hair follicles, or a minor injury to the skin. As the body tries to fight the infection, pus collects in the area and creates pressure under the skin. This pressure causes pain. People with weakened immune systems have difficulty fighting infections and get certain abscesses more often.  SYMPTOMS Usually an abscess develops on the skin and becomes a painful mass that is red, warm, and tender. If the abscess forms under the skin, you may feel a moveable soft area under the skin. Some abscesses break open (rupture) on their own, but most will continue to get worse without care. The infection can spread deeper into the body and eventually into the bloodstream, causing you to feel ill.  DIAGNOSIS  Your caregiver will take your medical history and perform a physical exam. A sample of fluid may also be taken from the abscess to determine what is causing your infection. TREATMENT  Your caregiver may prescribe antibiotic medicines to fight the infection. However, taking antibiotics alone usually does not cure an abscess. Your caregiver may need to make a small cut (incision) in the abscess to drain the pus. In some cases, gauze is packed into the abscess to reduce pain and to continue draining the area. HOME CARE INSTRUCTIONS   Only take over-the-counter or prescription medicines  for pain, discomfort, or fever as directed by your caregiver.  If you were prescribed antibiotics, take them as directed. Finish them even if you start to feel better.  If gauze is used, follow your caregiver's directions for changing the gauze.  To avoid spreading the infection:  Keep your draining abscess covered with a bandage.  Wash your hands well.  Do not share personal care items, towels, or whirlpools with others.  Avoid skin contact with others.  Keep your skin and clothes clean around the abscess.  Keep all follow-up appointments as directed by your caregiver. SEEK MEDICAL CARE IF:   You have increased pain, swelling, redness, fluid drainage, or bleeding.  You have muscle aches, chills, or a general ill feeling.  You have a fever. MAKE SURE YOU:   Understand these instructions.  Will watch your condition.  Will get help right away if you are not doing well or get worse.   This information is not intended to replace advice given to you by your health care provider. Make sure you discuss any questions you have with your health care provider.   Document Released: 11/18/2004 Document Revised: 08/10/2011 Document Reviewed: 04/23/2011 Elsevier Interactive Patient Education 2016 [employer]  Incision and Drainage Incision and drainage is a procedure in which a sac-like structure (cystic structure) is opened and drained. The area to be drained usually contains material such as pus, fluid, or blood.  LET YOUR CAREGIVER KNOW ABOUT:   Allergies to medicine.  Medicines taken, including vitamins, herbs, eyedrops, over-the-counter medicines, and creams.  Use of steroids (  by mouth or creams).  Previous problems with anesthetics or numbing medicines.  History of bleeding problems or blood clots.  Previous surgery.  Other health problems, including diabetes and kidney problems.  Possibility of pregnancy, if this applies. RISKS AND  COMPLICATIONS  Pain.  Bleeding.  Scarring.  Infection. BEFORE THE PROCEDURE  You may need to have an ultrasound or other imaging tests to see how large or deep your cystic structure is. Blood tests may also be used to determine if you have an infection or how severe the infection is. You may need to have a tetanus shot. PROCEDURE  The affected area is cleaned with a cleaning fluid. The cyst area will then be numbed with a medicine (local anesthetic). A small incision will be made in the cystic structure. A syringe or catheter may be used to drain the contents of the cystic structure, or the contents may be squeezed out. The area will then be flushed with a cleansing solution. After cleansing the area, it is often gently packed with a gauze or another wound dressing. Once it is packed, it will be covered with gauze and tape or some other type of wound dressing. AFTER THE PROCEDURE   Often, you will be allowed to go home right after the procedure.  You may be given antibiotic medicine to prevent or heal an infection.  If the area was packed with gauze or some other wound dressing, you will likely need to come back in 1 to 2 days to get it removed.  The area should heal in about 14 days.   This information is not intended to replace advice given to you by your health care provider. Make sure you discuss any questions you have with your health care provider.   Document Released: 08/04/2000 Document Revised: 08/10/2011 Document Reviewed: 04/05/2011 Elsevier Interactive Patient Education [employer]

## 2015-02-26 NOTE — ED Provider Notes (Signed)
CSN: [id number]     Arrival date & time 02/26/15  0844 History   First MD Initiated Contact with Patient 02/26/15 1005     Chief Complaint  Patient presents with  . Abscess     (Consider location/radiation/quality/duration/timing/severity/associated sxs/prior Treatment) HPI Derek French is a 50 y.o. male with history of recurrent abscesses, comes in for evaluation of acute abscess. Patient reports he typically gets abscesses under his left arm. He complains of one now, started last Thursday and has been gradually worsening since that time. He has tried OTC pain medications, warm compresses without relief. Certain movements worsen his discomfort. Nothing seems to make it better. He reports subjective fever yesterday that resolved spontaneously. No other chest pain, shortness of breath, abdominal pain or other medical complaints.  Past Medical History  Diagnosis Date  . Recurrent boils   . Stroke [facility])     2011 treated at [facility]  . Headache(784.0)   . Cerebral embolism with cerebral infarction (HCC) 09/26/2011   Past Surgical History  Procedure Laterality Date  . Hernia repair     No family history on file. Social History  Substance Use Topics  . Smoking status: Current Every Day Smoker -- 1.00 packs/day    Types: Cigarettes  . Smokeless tobacco: None  . Alcohol Use: Yes     Comment: occ    Review of Systems A 10 point review of systems was completed and was negative except for pertinent positives and negatives as mentioned in the history of present illness     Allergies  Codeine  Home Medications   Prior to Admission medications   Medication Sig Start Date End Date Taking? Authorizing Provider  aspirin 81 MG chewable tablet Chew 81 mg by mouth daily.      Historical Provider, MD  belladonna-opium (B&O SUPPRETTES) 16.2-30 MG suppository Place 1 suppository rectally every 8 (eight) hours as needed for pain. 11/25/14   Renne Crigler, PA-C  ciprofloxacin (CIPRO) 500 MG tablet  Take 1 tablet (500 mg total) by mouth 2 (two) times daily. 11/18/14   Gwyneth Sprout, MD  HYDROcodone-acetaminophen (NORCO/VICODIN) 5-325 MG tablet Take 1 tablet by mouth every 6 (six) hours as needed for moderate pain. 02/26/15   Joycie Peek, PA-C  ibuprofen (ADVIL,MOTRIN) 800 MG tablet Take 1 tablet (800 mg total) by mouth every 8 (eight) hours as needed for mild pain or moderate pain. 09/15/14   Trixie Dredge, PA-C  oxyCODONE-acetaminophen (PERCOCET/ROXICET) 5-325 MG tablet Take 1-2 tablets by mouth every 6 (six) hours as needed for severe pain. 11/25/14   Renne Crigler, PA-C  sulfamethoxazole-trimethoprim (BACTRIM DS,SEPTRA DS) 800-160 MG tablet Take 1 tablet by mouth 2 (two) times daily. 02/26/15 03/05/15  Melvenia Favela, PA-C   BP 141/93 mmHg  Pulse 63  Temp(Src) 98.9 F (37.2 C) (Oral)  Resp 18  SpO2 99% Physical Exam  Constitutional:  Awake, alert, nontoxic appearance.  HENT:  Head: Atraumatic.  Eyes: Right eye exhibits no discharge. Left eye exhibits no discharge.  Neck: Neck supple.  Pulmonary/Chest: Effort normal. He exhibits no tenderness.  Abdominal: Soft. There is no tenderness. There is no rebound.  Musculoskeletal: He exhibits no tenderness.  Area of fluctuance with surrounding induration and erythema noted to the inferior aspect of left axilla at the base of left arm. Small amount of drainage.  Neurological:  Mental status and motor strength appears baseline for patient and situation.  Skin: No rash noted.  Psychiatric: He has a normal mood and affect.  Nursing note  and vitals reviewed.   ED Course  Procedures (including critical care time) Labs Review Labs Reviewed - No data to display  Imaging Review No results found.   EMERGENCY DEPARTMENT US SOFT TISSUE INTERPRETATION "Study: Limited Ultrasound of the noted body part in comments below"  INDICATIONS: Soft tissue infection Multiple views of the body part are obtained with a multi-frequency linear  probe  PERFORMED BY:  Myself  IMAGES ARCHIVED?: Yes  SIDE:Left  BODY PART:Axilla  FINDINGS: Abcess present and Cellulitis present  LIMITATIONS:  Body Habitus  INTERPRETATION:  Abcess present and Cellulitis present  COMMENT:  Abscess with cellulitis under her left axilla.  INCISION AND DRAINAGE Performed by: Sharlene Mottsartner, Barnaby Rippeon W Consent: Verbal consent obtained. Risks and benefits: risks, benefits and alternatives were discussed Type: abscess  Body area: Left axilla  Anesthesia: local infiltration  Incision was made with a scalpel.  Local anesthetic: lidocaine 2 % with epinephrine  Anesthetic total: 3 ml  Complexity: complex Blunt dissection to break up loculations  Drainage: purulent  Drainage amount: Moderate   Packing material: 1/4 in iodoform gauze  Patient tolerance: Patient tolerated the procedure well with no immediate complications.    I have personally reviewed and evaluated these images and lab results as part of my medical decision-making.   EKG Interpretation None     Meds given in ED:  Medications  lidocaine-EPINEPHrine (XYLOCAINE W/EPI) 2 %-1:200000 (PF) injection 10 mL (not administered)  bacitracin ointment 1 application (1 application Topical Given 02/26/15 1121)  sodium bicarbonate (NEUT) 4 % injection 5 mL (5 mLs Subcutaneous Given 02/26/15 1118)  lidocaine-EPINEPHrine (XYLOCAINE W/EPI) 2 %-1:100000 (with pres) injection (3 mLs  Given 02/26/15 1119)  oxyCODONE-acetaminophen (PERCOCET/ROXICET) 5-325 MG per tablet 1 tablet (1 tablet Oral Given 02/26/15 1044)    Discharge Medication List as of 02/26/2015 11:17 AM    START taking these medications   Details  sulfamethoxazole-trimethoprim (BACTRIM DS,SEPTRA DS) 800-160 MG tablet Take 1 tablet by mouth 2 (two) times daily., Starting 02/26/2015, Until Wed 03/05/15, Print       Filed Vitals:   02/26/15 0900 02/26/15 1121  BP: 166/105 141/93  Pulse: 85 63  Temp: 98.9 F (37.2 C)   TempSrc: Oral    Resp: 18   SpO2: 100% 99%    MDM  Derek French is a 50 y.o. male with a history of recurrent left underarm abscess comes in for evaluation of acute abscess. Abscess confirmed via ultrasound, IND performed by myself at bedside. Patient tolerated procedure well. Wound was packed, instructions given to return in 3 days for wound recheck and packing removal. Also given [facility] to [facility] primary care and for blood pressure management. DC with antibiotics and short course pain medicines. Return precautions given. Patient verbalizes understanding and agrees with this plan.  The patient appears reasonably screened and/or stabilized for discharge and I doubt any other medical condition or other [facility] requiring further screening, evaluation, or treatment in the ED at this time prior to discharge.   Final diagnoses:  Abscess of arm, left       Joycie PeekBenjamin Markeem Noreen, PA-C 02/26/15 1413  Doug SouSam Jacubowitz, MD 02/26/15 1713

## 2015-02-26 NOTE — ED Notes (Signed)
Pt states that he has recurrent boils.  States that he has had abscess x 1 wk under lt arm.  Pt has high blood pressure but does not take medicine for it because "he just wants to work".  Pt informed that if he dies from an event related to HTN, he will not be able to work.

## 2015-06-03 ENCOUNTER — Ambulatory Visit (INDEPENDENT_AMBULATORY_CARE_PROVIDER_SITE_OTHER): Payer: Worker's Compensation | Admitting: Emergency Medicine

## 2015-06-03 ENCOUNTER — Encounter (HOSPITAL_COMMUNITY): Payer: Self-pay

## 2015-06-03 ENCOUNTER — Other Ambulatory Visit: Payer: Self-pay | Admitting: Emergency Medicine

## 2015-06-03 ENCOUNTER — Emergency Department (HOSPITAL_COMMUNITY)
Admission: EM | Admit: 2015-06-03 | Discharge: 2015-06-03 | Disposition: A | Discharge status: Home / Self Care | Payer: 59 | Attending: Emergency Medicine | Admitting: Emergency Medicine

## 2015-06-03 DIAGNOSIS — Y9389 Activity, other specified: Secondary | ICD-10-CM | POA: Insufficient documentation

## 2015-06-03 DIAGNOSIS — Z872 Personal history of diseases of the skin and subcutaneous tissue: Secondary | ICD-10-CM | POA: Insufficient documentation

## 2015-06-03 DIAGNOSIS — Z792 Long term (current) use of antibiotics: Secondary | ICD-10-CM | POA: Insufficient documentation

## 2015-06-03 DIAGNOSIS — Z8679 Personal history of other diseases of the circulatory system: Secondary | ICD-10-CM | POA: Insufficient documentation

## 2015-06-03 DIAGNOSIS — X58XXXA Exposure to other specified factors, initial encounter: Secondary | ICD-10-CM | POA: Insufficient documentation

## 2015-06-03 DIAGNOSIS — F17210 Nicotine dependence, cigarettes, uncomplicated: Secondary | ICD-10-CM | POA: Insufficient documentation

## 2015-06-03 DIAGNOSIS — T782XXA Anaphylactic shock, unspecified, initial encounter: Secondary | ICD-10-CM

## 2015-06-03 DIAGNOSIS — Z7982 Long term (current) use of aspirin: Secondary | ICD-10-CM | POA: Insufficient documentation

## 2015-06-03 DIAGNOSIS — Y998 Other external cause status: Secondary | ICD-10-CM | POA: Insufficient documentation

## 2015-06-03 DIAGNOSIS — T63441A Toxic effect of venom of bees, accidental (unintentional), initial encounter: Secondary | ICD-10-CM

## 2015-06-03 DIAGNOSIS — Z8673 Personal history of transient ischemic attack (TIA), and cerebral infarction without residual deficits: Secondary | ICD-10-CM | POA: Insufficient documentation

## 2015-06-03 DIAGNOSIS — Y9289 Other specified places as the place of occurrence of the external cause: Secondary | ICD-10-CM | POA: Insufficient documentation

## 2015-06-03 MED ORDER — FAMOTIDINE 20 MG PO TABS: 20.0000 mg | ORAL_TABLET | Freq: Two times a day (BID) | ORAL | Status: DC

## 2015-06-03 MED ORDER — DIPHENHYDRAMINE HCL 50 MG/ML IJ SOLN: 25.0000 mg | Freq: Once | INTRAMUSCULAR | Status: DC

## 2015-06-03 MED ORDER — FAMOTIDINE IN NACL 20-0.9 MG/50ML-% IV SOLN
20.0000 mg | Freq: Once | INTRAVENOUS | Status: AC
  Administered 2015-06-03: 20 mg via INTRAVENOUS
  Filled 2015-06-03: qty 50

## 2015-06-03 MED ORDER — EPINEPHRINE 0.3 MG/0.3ML IJ SOAJ: 0.3000 mg | Freq: Once | INTRAMUSCULAR | Status: DC

## 2015-06-03 MED ORDER — ALBUTEROL SULFATE (2.5 MG/3ML) 0.083% IN NEBU
2.5000 mg | INHALATION_SOLUTION | Freq: Once | RESPIRATORY_TRACT | Status: AC
  Administered 2015-06-03: 2.5 mg via RESPIRATORY_TRACT

## 2015-06-03 MED ORDER — PREDNISONE 10 MG PO TABS: 60.0000 mg | ORAL_TABLET | Freq: Every day | ORAL | Status: DC

## 2015-06-03 MED ORDER — DIPHENHYDRAMINE HCL 50 MG/ML IJ SOLN
50.0000 mg | Freq: Once | INTRAMUSCULAR | Status: AC
  Administered 2015-06-03: 50 mg via INTRAVENOUS
  Filled 2015-06-03: qty 1

## 2015-06-03 MED ORDER — METHYLPREDNISOLONE SODIUM SUCC 125 MG IJ SOLR
125.0000 mg | Freq: Once | INTRAMUSCULAR | Status: AC
  Administered 2015-06-03: 125 mg via INTRAVENOUS

## 2015-06-03 MED ORDER — DIPHENHYDRAMINE HCL 25 MG PO TABS: 25.0000 mg | ORAL_TABLET | Freq: Four times a day (QID) | ORAL | Status: DC

## 2015-06-03 MED ORDER — EPINEPHRINE 0.3 MG/0.3ML IJ SOAJ
0.3000 mg | Freq: Once | INTRAMUSCULAR | Status: DC
Start: 2015-06-03 — End: 2015-07-04

## 2015-06-03 NOTE — ED Notes (Signed)
MD at bedside. 

## 2015-06-03 NOTE — ED Notes (Signed)
Patient is alert and oriented x3.  He was given DC instructions and follow up visit instructions.  Patient gave verbal understanding.  He was DC ambulatory under his own power to home.  V/S stable.  He was not showing any signs of distress on DC 

## 2015-06-03 NOTE — ED Provider Notes (Signed)
CSN: [id number]     Arrival date & time 06/03/15  1239 History   First MD Initiated Contact with Patient 06/03/15 1319     Chief Complaint  Patient presents with  . Allergic Reaction      HPI Patient notes he was stung by a bee and began having localized allergic reaction on his right posterior shoulder as well as developed some difficulty breathing and vomiting and was taken emergently to an urgent care by coworkers where he received Solu-Medrol and epinephrine as well as 2.5 mg of albuterol and was brought to the emergency department by EMS for anaphylaxis.  On arrival to the emergency department the patient is feeling much better this time.  He reports no difficulty breathing or swallowing but reports some ongoing nausea.  He reports that the area of redness and swelling to his right posterior shoulder is improving as well.   Past Medical History  Diagnosis Date  . Recurrent boils   . Stroke [facility](HCC)     [facility]  . Headache(784.0)   . Cerebral embolism with cerebral infarction (HCC) 09/26/2011   Past Surgical History  Procedure Laterality Date  . Hernia repair     History reviewed. No pertinent family history. Social History  Substance Use Topics  . Smoking status: Current Every Day Smoker -- 1.00 packs/day    Types: Cigarettes  . Smokeless tobacco: None  . Alcohol Use: Yes     Comment: occ    Review of Systems  All other systems reviewed and are negative.     Allergies  Codeine  Home Medications   Prior to Admission medications   Medication Sig Start Date End Date Taking? Authorizing Provider  aspirin 81 MG chewable tablet Chew 81 mg by mouth daily.      Historical Provider, MD  belladonna-opium (B&O SUPPRETTES) 16.2-30 MG suppository Place 1 suppository rectally every 8 (eight) hours as needed for pain. 11/25/14   Renne CriglerJoshua Geiple, PA-C  ciprofloxacin (CIPRO) 500 MG tablet Take 1 tablet (500 mg total) by mouth 2 (two) times daily. 11/18/14   Gwyneth SproutWhitney Plunkett,  MD  HYDROcodone-acetaminophen (NORCO/VICODIN) 5-325 MG tablet Take 1 tablet by mouth every 6 (six) hours as needed for moderate pain. 02/26/15   Joycie PeekBenjamin Cartner, PA-C  ibuprofen (ADVIL,MOTRIN) 800 MG tablet Take 1 tablet (800 mg total) by mouth every 8 (eight) hours as needed for mild pain or moderate pain. 09/15/14   Trixie DredgeEmily West, PA-C  oxyCODONE-acetaminophen (PERCOCET/ROXICET) 5-325 MG tablet Take 1-2 tablets by mouth every 6 (six) hours as needed for severe pain. 11/25/14   Renne CriglerJoshua Geiple, PA-C   BP 152/95 mmHg  Pulse 80  Temp(Src) 97.8 F (36.6 C) (Oral)  Resp 16  SpO2 95% Physical Exam  Constitutional: He is oriented to person, place, and time. He appears well-developed and well-nourished.  HENT:  Head: Normocephalic and atraumatic.  No swelling of posterior pharynx or oral mucosa.  Tongue normal in size.  Tolerating secretions.  Eyes: EOM are normal.  Neck: Normal range of motion.  Cardiovascular: Normal rate, regular rhythm, normal heart sounds and intact distal pulses.   Pulmonary/Chest: Effort normal and breath sounds normal. No respiratory distress.  Abdominal: Soft. He exhibits no distension. There is no tenderness.  Musculoskeletal: Normal range of motion.  Neurological: He is alert and oriented to person, place, and time.  Skin: Skin is warm and dry.  No hives present  Psychiatric: He has a normal mood and affect. Judgment normal.  Nursing note and  vitals reviewed.   ED Course  Procedures (including critical care time) Labs Review Labs Reviewed - No data to display  Imaging Review No results found. I have personally reviewed and evaluated these images and lab results as part of my medical decision-making.   EKG Interpretation None      MDM   Final diagnoses:  None    Patient was observed in the emergency department for several hours without recurrence of his symptoms.  He received IV Pepcid and Benadryl to complete his treatment here.  He received steroids.   Patient be discharged home at this time with a diagnosis of anaphylaxis.  His symptoms of since resolved.  Discharge home with Benadryl, Pepcid, prednisone, EpiPen    Azalia Bilis, MD 06/03/15 1549

## 2015-06-03 NOTE — ED Notes (Signed)
Bed: RESA Expected date:  Expected time:  Means of arrival:  Comments: EMS- allergic reaction/medications given

## 2015-06-03 NOTE — Progress Notes (Signed)
CM spoke with pt who confirms uninsured [place] resident with no pcp.  CM discussed and provided written information for uninsured accepting pcps, discussed the importance of pcp vs EDP services for f/u care, www.needymeds.org, www.goodrx.com, discounted pharmacies and other [place] resources such as [facility] , [facility], affordable care act, financial assistance, uninsured dental services, [place] med assist, DSS and  health department  Reviewed resources for [place] uninsured accepting pcps like [facility], [facility] [address], [facility], [facility], local urgent care centers, [facility], [facility], general medical clinics, [facility] [place], [facility] plus others, medication resources, [facility] out patient pharmacies and housing Pt voiced understanding and appreciation of resources provided   Provided [facility] contact information Pt agreed to a referral Cm completed referral Pt to be contact by [facility] clinical liason  

## 2015-06-03 NOTE — Progress Notes (Signed)
Entered in d/c instructions  Pleae use the resources provided to you in ED Schedule an appointment as soon as possible for a visit  A referral for you has been sent to [facility] if you have not received a call in 3 days you may contact them Call Scherry RanKaren Andrianos at [phone] Tuesday-Friday [email or web address] , As needed

## 2015-06-03 NOTE — ED Notes (Signed)
Per EMS, Pt, from Urgent Care, presents after an allergic reaction.  Pt was stung by a bee on R shoulder.  Urgent care administered 0.3 epi, 125mg , Solu-Medrol, and 2.5mg  albuterol.  Pt reports that he was initally having difficulty breathing and had 1 episode of emesis.  All symptoms have resolved.

## 2015-06-03 NOTE — Progress Notes (Signed)
By signing my name below, I, Stann Ore, attest that this documentation has been prepared under the direction and in the presence of Lesle Chris, MD. Electronically Signed: Stann Ore, Scribe. 06/03/2015 , 12:17 PM .  Patient was seen in room 6 .  Chief Complaint: No chief complaint on file.   HPI: Derek French is a 50 y.o. male who reports to [facility] today complaining of bee sting over his right shoulder; believes it was a yellow jacket, that occurred earlier today at work. He was outside doing restoration work, and was stung. He became scared after it occurred because he is allergic to bees. He has labored breathing with shortness of breath, feeling hot and sweaty. He also notes feeling lightheaded with palpitations. He denies any itching or hives formation. He denies history of asthma. He denies history of heart issues. He normally carries an epipen with him; however, he just didn't have it with him today.   He informs his breathing is about the same at the time of exam. This occurred while he was at work today.   Allergies  Allergen Reactions  . Codeine Other (See Comments)    hallucinations    Prior to Admission medications   Medication Sig Start Date End Date Taking? Authorizing Provider  aspirin 81 MG chewable tablet Chew 81 mg by mouth daily.      Historical Provider, MD  belladonna-opium (B&O SUPPRETTES) 16.2-30 MG suppository Place 1 suppository rectally every 8 (eight) hours as needed for pain. 11/25/14   Renne Crigler, PA-C  ciprofloxacin (CIPRO) 500 MG tablet Take 1 tablet (500 mg total) by mouth 2 (two) times daily. 11/18/14   Gwyneth Sprout, MD  HYDROcodone-acetaminophen (NORCO/VICODIN) 5-325 MG tablet Take 1 tablet by mouth every 6 (six) hours as needed for moderate pain. 02/26/15   Joycie Peek, PA-C  ibuprofen (ADVIL,MOTRIN) 800 MG tablet Take 1 tablet (800 mg total) by mouth every 8 (eight) hours as needed for mild pain or moderate pain. 09/15/14   Trixie Dredge,  PA-C  oxyCODONE-acetaminophen (PERCOCET/ROXICET) 5-325 MG tablet Take 1-2 tablets by mouth every 6 (six) hours as needed for severe pain. 11/25/14   Renne Crigler, PA-C     ROS:   Constitutional: negative for fever, chills, night sweats, weight changes, or fatigue; positive for fever, diaphoresis HEENT: negative for vision changes, hearing loss, congestion, rhinorrhea, ST, epistaxis, or sinus pressure Cardiovascular: negative for chest pain; positive for palpitations Respiratory: negative for hemoptysis, wheezing, cough; positive for shortness of breath, labored breathing Abdominal: negative for abdominal pain, nausea, vomiting, diarrhea, or constipation Dermatological: negative for rash; positive for insect bite (bee sting) Neurologic: negative for headache, dizziness, or syncope; positive for lightheadedness All other systems reviewed and are otherwise negative with the exception to those above and in the HPI.  PHYSICAL EXAM: There were no vitals filed for this visit. There is no weight on file to calculate BMI.   General: Anxious, alert, complaining of difficulty breathing HEENT:  Normocephalic, atraumatic, oropharynx patent. Eye: [facility] Cardiovascular:  Regular rate and rhythm, no rubs murmurs or gallops.  No Carotid bruits, radial pulse intact. No pedal edema.  Respiratory: Diminished breath sounds in the bases, limited air exchange, no definite wheezes heard Abdominal: No organomegaly, abdomen is soft and non-tender, positive bowel sounds. No masses. Musculoskeletal: Gait intact. No edema, tenderness Skin: Area round right shoulder, no stingers seen Neurologic: Facial musculature symmetric. Psychiatric: Patient acts appropriately throughout our interaction.  Lymphatic: No cervical or submandibular lymphadenopathy Genitourinary/Anorectal:  No acute findings  12:05PM: solu-medrol IV placed and 0.3 epi subq; albuterol nebulizer treatment given 12:07PM: [facility]  LABS:   EKG/XRAY:     ASSESSMENT/PLAN: , Patient presented emergently with complaints of shortness of breath following a wasp sting to his right shoulder approximately 20 minutes ago. Patient states he broke out in a sweat and started having difficulty breathing. He typically carries an EpiPen with him but did not have it today. Initial vital signs were stable. He was treated with 0.3 epi along with 125 of Solu-Medrol and O2 followed by initiation of nebulizer treatment. He did not have evidence of hives or audible wheezing. There certainly could have been an anxiety component to his symptomatology. He was treated as an anaphylactic type reaction.I personally performed the services described in this documentation, which was scribed in my presence. The recorded information has been reviewed and is accurate.     Gross sideeffects, risk and benefits, and alternatives of medications d/w patient. Patient is aware that all medications have potential sideeffects and we are unable to predict every sideeffect or drug-drug interaction that may occur.  Lesle ChrisSteven Joyous Gleghorn MD 06/03/2015 12:17 PM

## 2015-07-01 ENCOUNTER — Emergency Department (HOSPITAL_COMMUNITY): Payer: MEDICAID

## 2015-07-01 ENCOUNTER — Inpatient Hospital Stay (HOSPITAL_COMMUNITY)
Admission: EM | Admit: 2015-07-01 | Discharge: 2015-07-04 | DRG: 918 | Disposition: A | Discharge status: Home / Self Care | Payer: Self-pay | Attending: Neurology | Admitting: Neurology

## 2015-07-01 ENCOUNTER — Emergency Department (HOSPITAL_COMMUNITY): Payer: 59

## 2015-07-01 ENCOUNTER — Inpatient Hospital Stay (HOSPITAL_COMMUNITY): Payer: 59

## 2015-07-01 ENCOUNTER — Encounter (HOSPITAL_COMMUNITY): Payer: Self-pay

## 2015-07-01 DIAGNOSIS — G43409 Hemiplegic migraine, not intractable, without status migrainosus: Secondary | ICD-10-CM | POA: Diagnosis present

## 2015-07-01 DIAGNOSIS — I6359 Cerebral infarction due to unspecified occlusion or stenosis of other cerebral artery: Secondary | ICD-10-CM

## 2015-07-01 DIAGNOSIS — F17210 Nicotine dependence, cigarettes, uncomplicated: Secondary | ICD-10-CM | POA: Diagnosis present

## 2015-07-01 DIAGNOSIS — I10 Essential (primary) hypertension: Secondary | ICD-10-CM | POA: Diagnosis present

## 2015-07-01 DIAGNOSIS — R4701 Aphasia: Secondary | ICD-10-CM | POA: Diagnosis present

## 2015-07-01 DIAGNOSIS — E785 Hyperlipidemia, unspecified: Secondary | ICD-10-CM | POA: Diagnosis present

## 2015-07-01 DIAGNOSIS — Z823 Family history of stroke: Secondary | ICD-10-CM

## 2015-07-01 DIAGNOSIS — I639 Cerebral infarction, unspecified: Secondary | ICD-10-CM | POA: Diagnosis present

## 2015-07-01 DIAGNOSIS — Z8673 Personal history of transient ischemic attack (TIA), and cerebral infarction without residual deficits: Secondary | ICD-10-CM

## 2015-07-01 DIAGNOSIS — F449 Dissociative and conversion disorder, unspecified: Secondary | ICD-10-CM | POA: Diagnosis present

## 2015-07-01 DIAGNOSIS — Z7289 Other problems related to lifestyle: Secondary | ICD-10-CM

## 2015-07-01 DIAGNOSIS — F1490 Cocaine use, unspecified, uncomplicated: Secondary | ICD-10-CM | POA: Diagnosis present

## 2015-07-01 DIAGNOSIS — T405X1A Poisoning by cocaine, accidental (unintentional), initial encounter: Principal | ICD-10-CM | POA: Diagnosis present

## 2015-07-01 DIAGNOSIS — Z7982 Long term (current) use of aspirin: Secondary | ICD-10-CM

## 2015-07-01 HISTORY — DX: Cocaine use, unspecified, uncomplicated: F14.90

## 2015-07-01 HISTORY — DX: Unspecified symptoms and signs involving the nervous system: R29.90

## 2015-07-01 HISTORY — DX: Cerebral infarction, unspecified: I63.9

## 2015-07-01 LAB — COMPREHENSIVE METABOLIC PANEL
ALBUMIN: 3.5 g/dL (ref 3.5–5.0)
ALK PHOS: 49 U/L (ref 38–126)
ALT: 16 U/L — ABNORMAL LOW (ref 17–63)
ANION GAP: 9 (ref 5–15)
AST: 19 U/L (ref 15–41)
BILIRUBIN TOTAL: 0.9 mg/dL (ref 0.3–1.2)
BUN: 7 mg/dL (ref 6–20)
CALCIUM: 9.2 mg/dL (ref 8.9–10.3)
CO2: 23 mmol/L (ref 22–32)
Chloride: 108 mmol/L (ref 101–111)
Creatinine, Ser: 0.99 mg/dL (ref 0.61–1.24)
GFR calc Af Amer: >60 mL/min (ref >60)
GFR calc non Af Amer: >60 mL/min (ref >60)
GLUCOSE: 132 mg/dL — AB (ref 65–99)
Potassium: 3.8 mmol/L (ref 3.5–5.1)
SODIUM: 140 mmol/L (ref 135–145)
TOTAL PROTEIN: 6.4 g/dL — AB (ref 6.5–8.1)

## 2015-07-01 LAB — DIFFERENTIAL
BASOS PCT: 0 %
Basophils Absolute: 0 10*3/uL (ref 0.0–0.1)
EOS PCT: 1 %
Eosinophils Absolute: 0.1 10*3/uL (ref 0.0–0.7)
LYMPHS PCT: 36 %
Lymphs Abs: 3 10*3/uL (ref 0.7–4.0)
Monocytes Absolute: 0.9 10*3/uL (ref 0.1–1.0)
Monocytes Relative: 11 %
NEUTROS PCT: 52 %
Neutro Abs: 4.5 10*3/uL (ref 1.7–7.7)

## 2015-07-01 LAB — CBC
HCT: 41.3 % (ref 39.0–52.0)
Hemoglobin: 14 g/dL (ref 13.0–17.0)
MCH: 31.1 pg (ref 26.0–34.0)
MCHC: 33.9 g/dL (ref 30.0–36.0)
MCV: 91.8 fL (ref 78.0–100.0)
PLATELETS: 262 10*3/uL (ref 150–400)
RBC: 4.5 MIL/uL (ref 4.22–5.81)
RDW: 14.2 % (ref 11.5–15.5)
WBC: 8.5 10*3/uL (ref 4.0–10.5)

## 2015-07-01 LAB — I-STAT CHEM 8, ED
BUN: 9 mg/dL (ref 6–20)
CHLORIDE: 105 mmol/L (ref 101–111)
Calcium, Ion: 1.13 mmol/L (ref 1.12–1.23)
Creatinine, Ser: 0.9 mg/dL (ref 0.61–1.24)
Glucose, Bld: 110 mg/dL — ABNORMAL HIGH (ref 65–99)
HEMATOCRIT: 46 % (ref 39.0–52.0)
Hemoglobin: 15.6 g/dL (ref 13.0–17.0)
Potassium: 4 mmol/L (ref 3.5–5.1)
SODIUM: 140 mmol/L (ref 135–145)
TCO2: 24 mmol/L (ref 0–100)

## 2015-07-01 LAB — CBG MONITORING, ED: GLUCOSE-CAPILLARY: 349 mg/dL — AB (ref 65–99)

## 2015-07-01 LAB — PROTIME-INR
INR: 1.11 (ref 0.00–1.49)
PROTHROMBIN TIME: 14.4 s (ref 11.6–15.2)

## 2015-07-01 LAB — I-STAT TROPONIN, ED: Troponin i, poc: 0 ng/mL (ref 0.00–0.08)

## 2015-07-01 LAB — ETHANOL: Alcohol, Ethyl (B): <5 mg/dL (ref <5)

## 2015-07-01 LAB — APTT: aPTT: 30 seconds (ref 24–37)

## 2015-07-01 MED ORDER — PANTOPRAZOLE SODIUM 40 MG IV SOLR
40.0000 mg | Freq: Every day | INTRAVENOUS | Status: DC
  Administered 2015-07-01 – 2015-07-03 (×3): 40 mg via INTRAVENOUS
  Filled 2015-07-01 (×3): qty 40

## 2015-07-01 MED ORDER — IOPAMIDOL (ISOVUE-370) INJECTION 76%
INTRAVENOUS | Status: AC
  Administered 2015-07-01: 19:00:00
  Filled 2015-07-01: qty 50

## 2015-07-01 MED ORDER — SODIUM CHLORIDE 0.9 % IV SOLN
50.0000 mL | Freq: Once | INTRAVENOUS | Status: AC
  Administered 2015-07-01: 50 mL via INTRAVENOUS

## 2015-07-01 MED ORDER — ALTEPLASE (STROKE) FULL DOSE INFUSION
0.9000 mg/kg | Freq: Once | INTRAVENOUS | Status: AC
Start: 2015-07-01 — End: 2015-07-01
  Administered 2015-07-01: 74 mg via INTRAVENOUS
  Filled 2015-07-01: qty 100

## 2015-07-01 MED ORDER — SODIUM CHLORIDE 0.9 % IV SOLN
INTRAVENOUS | Status: DC
  Administered 2015-07-01: 21:00:00 via INTRAVENOUS

## 2015-07-01 MED ORDER — LABETALOL HCL 5 MG/ML IV SOLN: 10.0000 mg | INTRAVENOUS | Status: DC | PRN

## 2015-07-01 MED ORDER — ACETAMINOPHEN 325 MG PO TABS
650.0000 mg | ORAL_TABLET | ORAL | Status: DC | PRN
  Administered 2015-07-01 – 2015-07-02 (×4): 650 mg via ORAL
  Filled 2015-07-01 (×4): qty 2

## 2015-07-01 MED ORDER — STROKE: EARLY STAGES OF RECOVERY BOOK
Freq: Once | Status: AC
Start: 2015-07-01 — End: 2015-07-01
  Administered 2015-07-01: 21:00:00
  Filled 2015-07-01: qty 1

## 2015-07-01 MED ORDER — ACETAMINOPHEN 650 MG RE SUPP: 650.0000 mg | RECTAL | Status: DC | PRN

## 2015-07-01 MED ORDER — IOPAMIDOL (ISOVUE-370) INJECTION 76%
INTRAVENOUS | Status: AC
  Filled 2015-07-01: qty 50

## 2015-07-01 NOTE — Progress Notes (Signed)
Patient arrived to 5M06 AAOx3. Patient tearful. Vitals continued q15 min until 2044. Patient asking when he can go home. Family has gone home for the evening. Continuing to monitor closely. Vitals stable at this time. Usama Harkless, [place] ScrapeSarah E, RN

## 2015-07-01 NOTE — H&P (Addendum)
Neurology H&P  CC: right sided weakness  History is obtained from:EMS  HPI: Derek French is a 50 y.o. male with a history of previous stroke(by discharge summary, negative MRI brain) who presents with sudden onset right sided weakness. He has been using cocaine recently. He was seen walking normally to the bathroom at 4:40pm. Family heard a crash and on entering found him on the ground. He was aphasic on EMS arrival, but began talking en route. Continued to have severe right sided weakness.   History is limite as he does not speak much, but he is abl eto communicate with nods/shakes appropriately and appears to understand.   LKW: 4:40 pm  tpa given?: yes    ROS:  Unable to obtain due to altered mental status.   Past Medical History  Diagnosis Date  . Recurrent boils   . Stroke [facility])     2011 treated at [facility]  . Headache(784.0)   . Cerebral embolism with cerebral infarction (HCC) 09/26/2011     FHx:  Unable to assess secondary to patient's altered mental status.     Social History:  reports that he has been smoking Cigarettes.  He has been smoking about 1.00 pack per day. He does not have any smokeless tobacco history on file. He reports that he drinks alcohol. He reports that he uses illicit drugs (Marijuana).   Exam: Current vital signs: BP 145/95 mmHg  Pulse 74  Temp(Src) 97.9 F (36.6 C) (Oral)  Resp 30  Wt 82.1 kg (181 lb)  SpO2 98% Vital signs in last 24 hours: Temp:  [97.9 F (36.6 C)] 97.9 F (36.6 C) (05/09 1815) Pulse Rate:  [74-81] 74 (05/09 1945) Resp:  [17-30] 30 (05/09 1945) BP: (135-151)/(88-101) 145/95 mmHg (05/09 1945) SpO2:  [96 %-99 %] 98 % (05/09 1945) Weight:  [82.1 kg (181 lb)] 82.1 kg (181 lb) (05/09 1800)   Physical Exam  Constitutional: Appears well-developed and well-nourished.  Psych: Affect appropriate to situation Eyes: No scleral injection HENT: No OP obstrucion, c-collar in place.  Head: Normocephalic.  Cardiovascular: Normal  rate and regular rhythm.  Respiratory: Effort normal and breath sounds normal to anterior ascultation GI: Soft.  No distension. There is no tenderness.  Skin: WDI  Neuro: Mental Status: Patient is awake, alert, he responds to questions with nods/shakes. Follows commands, speech is very dysarthric and sparse. Cranial Nerves: II: Cannot count fingers on the right, counts quickly on teh left. . Pupils are equal, round, and reactive to light.   III,IV, VI: EOMI without ptosis or diploplia.  V: Facial sensation is symmetric to temperature VII: Facial movement is symmetric.  VIII: hearing is intact to voice X: Uvula elevates symmetrically XI: Shoulder shrug is symmetric. XII: tongue is midline without atrophy or fasciculations.  He is dysarthric Motor: Tone is normal. Bulk is normal. 5/5 strength was present on the left side, he has 3/5 weakness of the right arm and leg.  Sensory: Sensation is diminished on the right.  Cerebellar: No ataxia on the left.     I have reviewed labs in epic and the results pertinent to this consultation are: cmp - unremarkable.  CBG: elevated BG, was given amp of D50 en route for BG of 59.   I have reviewed the images obtained:CT head, CTA head- negative.   Impression: 50 yo M with recent cocaine use with acute right sided weakness, dysarthria, field cut.  He is receiving IV tpa. He will be admitted for close observation.  Recommendations: 1. HgbA1c, fasting lipid panel 2. MRI, MRA  of the brain without contrast 3. Frequent neuro checks 4. Echocardiogram 5. Carotid dopplers 6. Prophylactic therapy-Antiplatelet med: Aspirin - dose 325mg  PO or 300mg  PR 7. Risk factor modification 8. Telemetry monitoring 9. PT consult, OT consult, Speech consult 10. please page stroke NP  Or  PA  Or MD  M-F from 8am -4 pm starting 5/10 as this patient will be followed by the stroke team at this point.   You can look them up on [email or web address]     This patient is  critically ill and at significant risk of neurological worsening, death and care requires constant monitoring of vital signs, hemodynamics,respiratory and cardiac monitoring, neurological assessment, discussion with family, other specialists and medical decision making of high complexity. I spent 50 minutes of neurocritical care time  in the care of  this patient.  Ritta SlotMcNeill Eissa Buchberger, MD [facility] [phone]  If 7pm- 7am, please page neurology on call as listed in AMION. 07/01/2015  8:07 PM

## 2015-07-01 NOTE — ED Notes (Signed)
Pt transported back to CT for CTA. 

## 2015-07-01 NOTE — ED Notes (Signed)
Pt brought in EMS for right sided weakness and facial droop.  Pt LSN was 1650.  Family heard a crash in the bathroom and found pt on floor with right sided deficits.

## 2015-07-01 NOTE — ED Notes (Signed)
Pt returned to room  

## 2015-07-01 NOTE — ED Notes (Signed)
Code stroke paged out @ 1737 by [facility].

## 2015-07-01 NOTE — ED Notes (Signed)
Kirkpatrick MD returned page and gave v/o for STAT head CT.  CT called and pt transported with RN

## 2015-07-01 NOTE — Code Documentation (Signed)
Patient last known well at 1650 when he went into the bathroom.  Family then heard a crash and found him unable to move his right arm and leg very well.  Patient arrived via EMS at 1742 Stat labs and head CT done.  NIHSS 12, right arm and leg weakness, slurred speech.  TPa started at 1819.  CTA head and neck done.  Patient then began to complain of headache and bilat ear ringing.  Head CT done.  Dr Amada JupiterKirkpatrick at bedside to assess patient.  Patient admitted to [phone]

## 2015-07-01 NOTE — ED Notes (Addendum)
Pt c/o sudden onset of HA and ringing in bilateral ears.  Kirkpatrick MD paged to be made aware.

## 2015-07-01 NOTE — ED Provider Notes (Signed)
CSN: [id number]     Arrival date & time 07/01/15  1742 History   First MD Initiated Contact with Patient 07/01/15 1746     Chief Complaint  Patient presents with  . Code Stroke   HPI Comments: Patient went to the shower. Normal mental state and health. Wife heard him fall and noted him to be a phasic and had right sided paresis. On arrival he was a phasic other systems improved any since been following commands and communicating with slurred speech. They noted right-sided facial droop and right-sided weakness and numbness. Initial fingerstick glucose level was 59 and improved to 200 with an amp of D50 however symptoms persisted. His blood pressure was 159/90. Collared by EMS.   Hx of CVA.  Has hx of cocaine use with cerebral vasospasm.  Patient is a 50 y.o. male presenting with neurologic complaint. The history is provided by the EMS personnel.  Neurologic Problem This is a new problem. The current episode started today. Associated symptoms include headaches, neck pain, numbness (entire right side) and weakness (entire right side). Pertinent negatives include no chest pain, visual change or vomiting.   Last known normal 1640  Past Medical History  Diagnosis Date  . Recurrent boils   . Stroke [facility])     2011 treated at [facility]  . Headache(784.0)   . Cerebral embolism with cerebral infarction (HCC) 09/26/2011   Past Surgical History  Procedure Laterality Date  . Hernia repair     History reviewed. No pertinent family history. Social History  Substance Use Topics  . Smoking status: Current Every Day Smoker -- 1.00 packs/day    Types: Cigarettes  . Smokeless tobacco: None  . Alcohol Use: Yes     Comment: occ    Review of Systems  Unable to perform ROS: Other  Cardiovascular: Negative for chest pain.  Gastrointestinal: Negative for vomiting.  Musculoskeletal: Positive for neck pain.  Neurological: Positive for weakness (entire right side), numbness (entire right side) and headaches.    Due to aphasia, ROS limited. Level 5 exception   Allergies  Bee venom and Codeine  Home Medications   Prior to Admission medications   Medication Sig Start Date End Date Taking? Authorizing Provider  aspirin EC 81 MG tablet Take 81 mg by mouth daily.    Historical Provider, MD  diphenhydrAMINE (BENADRYL) 25 MG tablet Take 1 tablet (25 mg total) by mouth every 6 (six) hours. 06/03/15   Azalia Bilis, MD  EPINEPHrine (EPIPEN 2-PAK) 0.3 mg/0.3 mL IJ SOAJ injection Inject 0.3 mLs (0.3 mg total) into the muscle once. 06/03/15   Azalia Bilis, MD  famotidine (PEPCID) 20 MG tablet Take 1 tablet (20 mg total) by mouth 2 (two) times daily. 06/03/15   Azalia Bilis, MD  predniSONE (DELTASONE) 10 MG tablet Take 6 tablets (60 mg total) by mouth daily. 06/03/15   Azalia Bilis, MD   BP 116/87 mmHg  Pulse 85  Temp(Src) 98.2 F (36.8 C) (Oral)  Resp 18  Wt 82.1 kg  SpO2 96% Physical Exam  Constitutional: He appears well-developed and well-nourished.  Speech slurred.  Handling secretions fine. Follows commands  HENT:  Head: Normocephalic and atraumatic.  Nose: Nose normal.  Eyes: Conjunctivae are normal. Pupils are equal, round, and reactive to light.  EOMI.  Pupils2 mm equal and reactive  Neck: Normal range of motion. Neck supple. No tracheal deviation present.  Collared, c spine ttp  Cardiovascular: Normal rate, regular rhythm, normal heart sounds and intact distal pulses.  No murmur heard. Pulmonary/Chest: Effort normal and breath sounds normal. No respiratory distress. He has no rales.  Abdominal: Soft. Bowel sounds are normal. He exhibits no distension and no mass. There is no tenderness. There is no guarding.  Musculoskeletal: Normal range of motion. He exhibits no edema or tenderness.  Neurological:  Alert, GCS 14-15.  Right arm and leg 0/5 strength.  No sensation to right face/arm/leg.   Babinski toes downgoing.  Right facial droop howver he appears to be crunching right eye down on CN  testing and curling his eyebrow.  Tongue intermittently crossed midline to the right.  But would not on direct testing.    Skin: Skin is warm. No rash noted.  Psychiatric:  anxious  Nursing note and vitals reviewed.   ED Course  Procedures (including critical care time) Labs Review Labs Reviewed  COMPREHENSIVE METABOLIC PANEL - Abnormal; Notable for the following:    Glucose, Bld 132 (*)    Total Protein 6.4 (*)    ALT 16 (*)    All other components within normal limits  I-STAT CHEM 8, ED - Abnormal; Notable for the following:    Glucose, Bld 110 (*)    All other components within normal limits  CBG MONITORING, ED - Abnormal; Notable for the following:    Glucose-Capillary 349 (*)    All other components within normal limits  ETHANOL  PROTIME-INR  APTT  CBC  DIFFERENTIAL  URINE RAPID DRUG SCREEN, HOSP PERFORMED  URINALYSIS, ROUTINE W REFLEX MICROSCOPIC (NOT AT [facility])  HEMOGLOBIN A1C  LIPID PANEL  I-STAT TROPOININ, ED    Imaging Review Ct Angio Head W/cm &/or Wo Cm  07/01/2015  CLINICAL DATA:  Fall today. Code stroke. TPA given prior to this examination. EXAM: CT ANGIOGRAPHY HEAD AND NECK TECHNIQUE: Multidetector CT imaging of the head and neck was performed using the standard protocol during bolus administration of intravenous contrast. Multiplanar CT image reconstructions and MIPs were obtained to evaluate the vascular anatomy. Carotid stenosis measurements (when applicable) are obtained utilizing NASCET criteria, using the distal internal carotid diameter as the denominator. CONTRAST:  80 mL Isovue 370 IV COMPARISON:  CT head 07/01/2015 FINDINGS: CTA NECK Aortic arch: Normal aortic arch. Bovine branching pattern. Proximal great vessels widely patent. Mild emphysema in the apices. Right carotid system: Right carotid artery widely patent. Right carotid bifurcation normal. No atherosclerotic disease or dissection Left carotid system: Left carotid widely patent. Normal carotid  bifurcation without stenosis or dissection Vertebral arteries:Both vertebral arteries appear normal and patent to the basilar. No dissection or stenosis. Skeleton: Disc degeneration and spurring at C4-5. No acute skeletal abnormality. Other neck: Asymmetric enlargement of the right sternocleidomastoid muscle. This could be due to hematoma from fall today. No mass lesion. CTA HEAD Anterior circulation: Cavernous carotid widely patent bilaterally without stenosis or calcification. Anterior and middle cerebral arteries widely patent bilaterally. Posterior circulation: Both vertebral arteries patent patent to the basilar. PICA patent bilaterally. Basilar is small but patent. Fetal origin right posterior cerebral artery. Superior cerebellar and posterior cerebral arteries patent bilaterally without stenosis. Venous sinuses: Not well opacified. Anatomic variants: Negative for aneurysm Delayed phase: Not performed IMPRESSION: Negative CTA head and neck. No atherosclerotic disease dissection or occlusion. Enlargement of the right sternocleidomastoid muscle in the lower neck. This may be due to muscle injury from fall. These results were discussed in person 07/01/2015 at 6:48 Pm to Dr. Ritta SlotMCNEILL KIRKPATRICK , who verbally acknowledged these results. Electronically Signed   By: Marlan Palauharles  Crompton M.D.  On: 07/01/2015 19:02   Ct Head Wo Contrast  07/01/2015  CLINICAL DATA:  50 year old male status post tPA administration for code stroke now with sudden onset of headache. EXAM: CT HEAD WITHOUT CONTRAST TECHNIQUE: Contiguous axial images were obtained from the base of the skull through the vertex without intravenous contrast. COMPARISON:  Earlier head CT and CT angiography dated 07/01/2015 FINDINGS: The ventricles and sulci are appropriate in size for the patient's age. There is no intracranial hemorrhage. No mass effect or midline shift identified. The gray-white matter differentiation is preserved. There is no extra-axial fluid  collection. The visualized paranasal sinuses and mastoid air cells are well aerated. The calvarium is intact. IMPRESSION: No acute intracranial hemorrhage. Electronically Signed   By: Elgie Collard M.D.   On: 07/01/2015 19:26   Ct Head Wo Contrast  07/01/2015  CLINICAL DATA:  Fall today in the bathroom. Right-sided numbness. Code stroke. EXAM: CT HEAD WITHOUT CONTRAST TECHNIQUE: Contiguous axial images were obtained from the base of the skull through the vertex without intravenous contrast. COMPARISON:  09/27/2011 and 09/26/2011. FINDINGS: The brainstem, cerebellum, cerebral peduncles, thalami, basal ganglia, basilar cisterns, and ventricular system appear within normal limits. No intracranial hemorrhage, mass lesion, or acute CVA. Mucosal swelling in the nasal cavity. Minimal mucosal thickening in the right maxillary sinus. IMPRESSION: 1. No acute intracranial findings. 2. Minimal chronic right maxillary sinusitis. These results were called by telephone at the time of interpretation on 07/01/2015 at 6:32 pm to Dr. Amada Jupiter, who verbally acknowledged these results. Electronically Signed   By: Gaylyn Rong M.D.   On: 07/01/2015 18:35   Ct Angio Neck W/cm &/or Wo/cm  07/01/2015  CLINICAL DATA:  Fall today. Code stroke. TPA given prior to this examination. EXAM: CT ANGIOGRAPHY HEAD AND NECK TECHNIQUE: Multidetector CT imaging of the head and neck was performed using the standard protocol during bolus administration of intravenous contrast. Multiplanar CT image reconstructions and MIPs were obtained to evaluate the vascular anatomy. Carotid stenosis measurements (when applicable) are obtained utilizing NASCET criteria, using the distal internal carotid diameter as the denominator. CONTRAST:  80 mL Isovue 370 IV COMPARISON:  CT head 07/01/2015 FINDINGS: CTA NECK Aortic arch: Normal aortic arch. Bovine branching pattern. Proximal great vessels widely patent. Mild emphysema in the apices. Right carotid  system: Right carotid artery widely patent. Right carotid bifurcation normal. No atherosclerotic disease or dissection Left carotid system: Left carotid widely patent. Normal carotid bifurcation without stenosis or dissection Vertebral arteries:Both vertebral arteries appear normal and patent to the basilar. No dissection or stenosis. Skeleton: Disc degeneration and spurring at C4-5. No acute skeletal abnormality. Other neck: Asymmetric enlargement of the right sternocleidomastoid muscle. This could be due to hematoma from fall today. No mass lesion. CTA HEAD Anterior circulation: Cavernous carotid widely patent bilaterally without stenosis or calcification. Anterior and middle cerebral arteries widely patent bilaterally. Posterior circulation: Both vertebral arteries patent patent to the basilar. PICA patent bilaterally. Basilar is small but patent. Fetal origin right posterior cerebral artery. Superior cerebellar and posterior cerebral arteries patent bilaterally without stenosis. Venous sinuses: Not well opacified. Anatomic variants: Negative for aneurysm Delayed phase: Not performed IMPRESSION: Negative CTA head and neck. No atherosclerotic disease dissection or occlusion. Enlargement of the right sternocleidomastoid muscle in the lower neck. This may be due to muscle injury from fall. These results were discussed in person 07/01/2015 at 6:48 Pm to Dr. Ritta Slot , who verbally acknowledged these results. Electronically Signed   By: Marlan Palau M.D.  On: 07/01/2015 19:02   Ct Cervical Spine Wo Contrast  07/01/2015  CLINICAL DATA:  50 year old male with fall.  Code stroke. EXAM: CT CERVICAL SPINE WITHOUT CONTRAST TECHNIQUE: Multidetector CT imaging of the cervical spine was performed without intravenous contrast. Multiplanar CT image reconstructions were also generated. COMPARISON:  Multiple earlier head CT andCTA studies dated 07/01/2015 FINDINGS: The ventricles and the sulci are appropriate in size  for the patient's age. There is no intracranial hemorrhage. No midline shift or mass effect identified. The gray-white matter differentiation is preserved. The visualized paranasal sinuses and mastoid air cells are well aerated. The calvarium is intact. IMPRESSION: No acute intracranial hemorrhage. Electronically Signed   By: Elgie Collard M.D.   On: 07/01/2015 20:58   I have personally reviewed and evaluated these images and lab results as part of my medical decision-making.   EKG Interpretation None      MDM   Final diagnoses:  Stroke (cerebrum) (HCC)  Stroke (cerebrum) (HCC)     Airway intact on arrival. Neurology at bedside co-managing pt. CT made rapidly available. FSBG wo hypoglycemia.  VSS. Collared due to fall, will re-assess.  Not markedly HTN to think of ICH; no hemorrhage on CT.  I have reviewed prior CVA in setting of cocaine use.  No other source of trauma on exam. No extrem injury or deformity.  Will check c spine.  CTA neg for acute vessel cut off.   Decision was made to give tPa by neurology in conjunction with pt capacity.  CT cervical spine wo fx.  Metabolic panel wo abnormality to explain deficits.  Admitted to neurology service.   Pt did have HA after tPa, CT head neg for ICH .  No other events during my care  Sofie Rower, MD 07/01/15 2354  Derwood Kaplan, MD 07/02/15 [phone]

## 2015-07-01 NOTE — ED Notes (Signed)
Pt returned from CT °

## 2015-07-01 NOTE — ED Notes (Signed)
CBG - 349 

## 2015-07-02 ENCOUNTER — Inpatient Hospital Stay (HOSPITAL_COMMUNITY): Payer: 59

## 2015-07-02 ENCOUNTER — Inpatient Hospital Stay (HOSPITAL_COMMUNITY): Payer: MEDICAID

## 2015-07-02 ENCOUNTER — Encounter (HOSPITAL_COMMUNITY): Payer: Self-pay | Admitting: Nurse Practitioner

## 2015-07-02 DIAGNOSIS — I67848 Other cerebrovascular vasospasm and vasoconstriction: Secondary | ICD-10-CM

## 2015-07-02 DIAGNOSIS — I6789 Other cerebrovascular disease: Secondary | ICD-10-CM | POA: Diagnosis not present

## 2015-07-02 DIAGNOSIS — F1410 Cocaine abuse, uncomplicated: Secondary | ICD-10-CM

## 2015-07-02 DIAGNOSIS — E785 Hyperlipidemia, unspecified: Secondary | ICD-10-CM

## 2015-07-02 DIAGNOSIS — R2990 Unspecified symptoms and signs involving the nervous system: Secondary | ICD-10-CM

## 2015-07-02 LAB — LIPID PANEL
CHOLESTEROL: 175 mg/dL (ref 0–200)
HDL: 42 mg/dL (ref >40)
LDL Cholesterol: 125 mg/dL — ABNORMAL HIGH (ref 0–99)
Total CHOL/HDL Ratio: 4.2 RATIO
Triglycerides: 40 mg/dL (ref <150)
VLDL: 8 mg/dL (ref 0–40)

## 2015-07-02 LAB — RAPID URINE DRUG SCREEN, HOSP PERFORMED
AMPHETAMINES: NOT DETECTED
BARBITURATES: NOT DETECTED
BENZODIAZEPINES: NOT DETECTED
Cocaine: POSITIVE — AB
Opiates: NOT DETECTED
TETRAHYDROCANNABINOL: NOT DETECTED

## 2015-07-02 LAB — URINALYSIS, ROUTINE W REFLEX MICROSCOPIC
BILIRUBIN URINE: NEGATIVE
Glucose, UA: 250 mg/dL — AB
HGB URINE DIPSTICK: NEGATIVE
Ketones, ur: 15 mg/dL — AB
Leukocytes, UA: NEGATIVE
Nitrite: NEGATIVE
PROTEIN: NEGATIVE mg/dL
SPECIFIC GRAVITY, URINE: 1.029 (ref 1.005–1.030)
pH: 6 (ref 5.0–8.0)

## 2015-07-02 LAB — ECHOCARDIOGRAM COMPLETE
HEIGHTINCHES: 74 in
WEIGHTICAEL: 2905.6 [oz_av]

## 2015-07-02 MED ORDER — ASPIRIN 325 MG PO TABS
325.0000 mg | ORAL_TABLET | Freq: Every day | ORAL | Status: DC
  Administered 2015-07-02 – 2015-07-04 (×3): 325 mg via ORAL
  Filled 2015-07-02 (×3): qty 1

## 2015-07-02 MED ORDER — ENOXAPARIN SODIUM 40 MG/0.4ML ~~LOC~~ SOLN
40.0000 mg | SUBCUTANEOUS | Status: DC
  Administered 2015-07-02 – 2015-07-03 (×2): 40 mg via SUBCUTANEOUS
  Filled 2015-07-02 (×2): qty 0.4

## 2015-07-02 MED ORDER — ATORVASTATIN CALCIUM 10 MG PO TABS
20.0000 mg | ORAL_TABLET | Freq: Every day | ORAL | Status: DC
  Administered 2015-07-02 – 2015-07-03 (×2): 20 mg via ORAL
  Filled 2015-07-02 (×2): qty 2

## 2015-07-02 NOTE — Progress Notes (Signed)
Patient returned from MRI. MD and NP paged to order Aspirin. Awaiting orders.

## 2015-07-02 NOTE — Care Management Note (Signed)
Case Management Note  Patient Details  Name: Derek PallRobert M French MRN: [id number] Date of Birth: 04/09/1965  Subjective/Objective:                    Action/Plan: Patient was admitted with right-sided weakness and aphasia. Received TPA at admission. Lives at home with family.  Patient is currently listed as self-pay and is being followed by Jasmin in [facility]. Will follow for discharge needs pending patient's progress and physician orders.  Expected Discharge Date:   (Pending)               Expected Discharge Plan:     In-House Referral:     Discharge planning Services     Post Acute Care Choice:    Choice offered to:     DME Arranged:    DME Agency:     HH Arranged:    HH Agency:     Status of Service:  In process, will continue to follow  Medicare Important Message Given:    Date Medicare IM Given:    Medicare IM give by:    Date Additional Medicare IM Given:    Additional Medicare Important Message give by:     If discussed at Long Length of Stay Meetings, dates discussed:    Additional CommentsAnda Kraft:  Derek Hunnell C, RN 07/02/2015, 2:47 PM [phone]

## 2015-07-02 NOTE — Progress Notes (Signed)
  Echocardiogram 2D Echocardiogram has been performed.  Derek SavoyCasey N Markiya French 07/02/2015, 12:18 PM

## 2015-07-02 NOTE — Evaluation (Signed)
Speech Language Pathology Evaluation Patient Details Name: Derek PallRobert M French MRN: [id number] DOB: 03/04/1965 Today's Date: 07/02/2015 Time: 0828-0909 SLP Time Calculation (min) (ACUTE ONLY): 41 min  Problem List:  Patient Active Problem List   Diagnosis Date Noted  . Stroke (cerebrum) (HCC) 07/01/2015  . Cerebral ischemia 09/26/2011  . Hemiplegia, unspecified, affecting nondominant side 09/26/2011  . Tinnitus 09/26/2011  . CVA (cerebral infarction) 03/30/2011  . Dyslipidemia 03/30/2011  . Leukocytosis 03/30/2011  . Substance abuse 03/30/2011  . Cigarette smoker 03/30/2011   Past Medical History:  Past Medical History  Diagnosis Date  . Recurrent boils   . Stroke [facility](HCC)     [facility]  . Headache(784.0)   . Cerebral embolism with cerebral infarction [facility](HCC) 09/26/2011   Past Surgical History:  Past Surgical History  Procedure Laterality Date  . Hernia repair     HPI:  50 yo male [facility] after falling with right sided weakness and heachache.  PMH for cocaine use, CVA 2011.  Pt CT cervical spine and head negative.  He was aphasic upon transferring to hospital but improvement in expressive languge apparent.  Speech evaluation ordered.    Assessment / Plan / Recommendation Clinical Impression  Pt presents with mild expressive language deficits resulting in delay in answering questions most notably related to numbers.  Portion of MOCA administred revealing pt with attention difficulties *areas of mental math, delayed recall (3/5 without cue, 2/5 with cue).  Functionally delay in expression noted but anticipate for this to improve.  Right facial upper and lower facial sensation deficits, motor deficits noted lower right face. Pt also with retrograde amnesia to medical event.    Given pt reports he works and was independent prior to admit, SLP follow up indicated to maximize his cognitive lingusitic rehabiliation.  Skilled intervention included reviewing test results and goal  establishment.  Thanks for this consult.      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: Full time employment   Cognition  Orientation Level: Oriented X4 (to personal birth year with cue) Attention: Sustained;Selective Sustained Attention: Appears intact Selective Attention: Appears intact Memory: Impaired Memory Impairment: Retrieval deficit (recalled 3/5 words independently, 2 with cues) Awareness: Appears intact (awareness to sensorimotor deficits and expressive language changes) Problem Solving: Appears intact (need to call for assist) Safety/Judgment: Appears intact    Comprehension  Auditory Comprehension Overall Auditory Comprehension: Appears within functional limits for tasks assessed Yes/No Questions: Not tested Commands: Within Functional Limits Conversation: Complex Visual Recognition/Discrimination Discrimination: Within Function Limits Reading Comprehension Reading Status: Not tested (pt reports "blurry" vision on right side)    Expression Expression Primary Mode of Expression: Verbal Verbal Expression Overall Verbal Expression: Impaired Initiation: No impairment Level of Generative/Spontaneous Verbalization: Sentence (pt answers questions but does not expand on communication) Repetition: No impairment Naming: Impairment (minimal delay in naming pictures, but pt admits to vision changes) Pragmatics: No impairment ("flat affect" suspect baseline) Written Expression Dominant Hand: Right Written Expression: Not tested (pt with right sided weakness)   Oral / Motor  Motor Speech Overall Motor Speech: Impaired Respiration:  Within functional limits Resonance: Within functional limits Articulation: Impaired (mandibular movement limited by cervical collar)  Level of Impairment: Phrase Intelligibility: Intelligible Motor Planning: Witnin functional limits Motor Speech Errors: Not applicable   GO                    Donavan Burnet, MS [facility] SLP [phone]

## 2015-07-02 NOTE — Progress Notes (Signed)
STROKE TEAM PROGRESS NOTE   HISTORY OF PRESENT ILLNESS Derek French is a 50 y.o. male with a history of previous stroke (by discharge summary, negative MRI brain) who presents with sudden onset right sided weakness. He has been using cocaine recently. He was seen walking normally to the bathroom at 4:40pm on 07/01/2015. Family heard a crash and on entering found him on the ground. He was aphasic on EMS arrival, but began talking en route. Continued to have severe right sided weakness.   History is limited as he does not speak much, but he is able to communicate with nods/shakes appropriately and appears to understand.   Patient was administered IV t-PA . He was admitted to the neuro floor for further evaluation and treatment.   SUBJECTIVE (INTERVAL HISTORY) His RN is at the bedside.  He remains in a C collar. patient awake, talking. Patient reports a history of stroke as well as a family hx of stroke. He reports he had not used cocaine for 4 years until this week when he relapsed. He went to work for 2 days and did well. Reports HA. Says he had some R arm weakness, though can touch his nose. Overall he feels his condition is stable. Discussed current plan with RN at bedside.   As per record he had 3 prior strokes all with right side weakness, all with positive cocaine and all had complete recovery. For the last 4 years, he did not have stroke and he did not use cocaine. He relapsed last week and good for 2 days and then yesterday again right side weakness and fall with LOC. His MRI in the past all negative associated with stroke. He reported some HA after the stroke symptoms. He stated that his father had suffered strokes. He agrees to quit cocaine.   OBJECTIVE Temp:  [97.7 F (36.5 C)-98.4 F (36.9 C)] 97.9 F (36.6 C) (05/10 1045) Pulse Rate:  [60-85] 70 (05/10 1045) Cardiac Rhythm:  [-] Normal sinus rhythm (05/09 2030) Resp:  [15-30] 16 (05/10 1045) BP: (113-154)/(79-103) 151/87 mmHg  (05/10 1045) SpO2:  [94 %-99 %] 97 % (05/10 1045) Weight:  [82.1 kg (181 lb)-82.373 kg (181 lb 9.6 oz)] 82.373 kg (181 lb 9.6 oz) (05/10 0845)  CBC:   Recent Labs Lab 07/01/15 1820 07/01/15 1825  WBC 8.5  --   NEUTROABS 4.5  --   HGB 14.0 15.6  HCT 41.3 46.0  MCV 91.8  --   PLT 262  --     Basic Metabolic Panel:   Recent Labs Lab 07/01/15 1820 07/01/15 1825  NA 140 140  K 3.8 4.0  CL 108 105  CO2 23  --   GLUCOSE 132* 110*  BUN 7 9  CREATININE 0.99 0.90  CALCIUM 9.2  --     Lipid Panel:     Component Value Date/Time   CHOL 175 07/02/2015 0602   TRIG 40 07/02/2015 0602   HDL 42 07/02/2015 0602   CHOLHDL 4.2 07/02/2015 0602   VLDL 8 07/02/2015 0602   LDLCALC 125* 07/02/2015 0602   HgbA1c:  Lab Results  Component Value Date   HGBA1C 5.7* 09/27/2011   Urine Drug Screen:     Component Value Date/Time   LABOPIA NONE DETECTED 07/02/2015 0055   COCAINSCRNUR POSITIVE* 07/02/2015 0055   LABBENZ NONE DETECTED 07/02/2015 0055   AMPHETMU NONE DETECTED 07/02/2015 0055   THCU NONE DETECTED 07/02/2015 0055   LABBARB NONE DETECTED 07/02/2015 0055  IMAGING  Ct Head Wo Contrast 07/01/2015  No acute intracranial hemorrhage.  07/01/2015  1. No acute intracranial findings. 2. Minimal chronic right maxillary sinusitis.   Ct Angio Head & Neck W/cm &/or Wo/cm 07/01/2015   Negative CTA head and neck. No atherosclerotic disease dissection or occlusion. Enlargement of the right sternocleidomastoid muscle in the lower neck. This may be due to muscle injury from fall.   Ct Cervical Spine Wo Contrast 07/01/2015   No acute intracranial hemorrhage.   TTE - - Left ventricle: The cavity size was normal. Systolic function was  normal. The estimated ejection fraction was in the range of 55%  to 60%. Wall motion was normal; there were no regional wall  motion abnormalities. Doppler parameters are consistent with  abnormal left ventricular relaxation (grade 1 diastolic   dysfunction). Doppler parameters are consistent with  indeterminate ventricular filling pressure. - Aortic valve: Transvalvular velocity was within the normal range.  There was no stenosis. There was moderate regurgitation.  Regurgitation pressure half-time: 332 ms. - Mitral valve: Transvalvular velocity was within the normal range.  There was no evidence for stenosis. There was no regurgitation. - Right ventricle: The cavity size was normal. Wall thickness was  normal. Systolic function was normal. - Atrial septum: No defect or patent foramen ovale was identified. - Tricuspid valve: There was no regurgitation. - Inferior vena cava: The vessel was normal in size. The  respirophasic diameter changes were in the normal range (>= 50%).  MRI - pending   PHYSICAL EXAM  Temp:  [97.7 F (36.5 C)-98.4 F (36.9 C)] 97.9 F (36.6 C) (05/10 1445) Pulse Rate:  [60-85] 67 (05/10 1445) Resp:  [15-30] 18 (05/10 1445) BP: (113-154)/(79-103) 130/86 mmHg (05/10 1445) SpO2:  [94 %-99 %] 97 % (05/10 1445) Weight:  [181 lb (82.1 kg)-181 lb 9.6 oz (82.373 kg)] 181 lb 9.6 oz (82.373 kg) (05/10 0845)  General - Well nourished, well developed, in no apparent distress, on C-collar.  Ophthalmologic - Fundi not visualized due to small pupils.  Cardiovascular - Regular rate and rhythm.  Mental Status -  Level of arousal and orientation to time, place, and person were intact. Language including expression, naming, repetition, comprehension was assessed and found intact. Fund of Knowledge was assessed and was intact.  Cranial Nerves II - XII - II - right partial hemianopia. III, IV, VI - Extraocular movements intact. V - Facial sensation decreased on the right. VII - Facial movement intact bilaterally. VIII - Hearing & vestibular intact bilaterally. X - Palate elevates symmetrically. XI - Chin turning & shoulder shrug intact bilaterally. XII - Tongue protrusion intact.  Motor Strength - The  patient's strength was RUE 3/5 proximal and 4/5 distal, RLE 3/5 proximal and distal and RUE drift. LUE and LLE 5/5.  Bulk was normal and fasciculations were absent.   Motor Tone - Muscle tone was assessed at the neck and appendages and was normal.  Reflexes - The patient's reflexes were 1+ in all extremities and he had no pathological reflexes.  Sensory - Light touch, temperature/pinprick were assessed and were decreased on the right.    Coordination - The patient had normal movements in the right hand with no ataxia or dysmetria.  Tremor was absent.  Gait and Station - not tested due to weakness.   ASSESSMENT/PLAN Derek French is a 50 y.o. male with history of reported stroke (hospital records are negative) and polysubstance abuse presenting following a fall with aphasia which resolved -? R  HP and R field cut. He received IV t-PA 07/01/2015 at 1819.   Right hemiparesis S/p IV tPA - etiology unclear, cocaine related vasospasm vs. Hemiplegic migraine. Since all strokes so far related to cocaine use, stroke symptoms likely related to cocaine. It is also possible that cocaine use triggers HA leading to hemiplegic reaction. Pt needs to quit cocaine first.  Resultant R HP and R field cut. Mild headache  Initial CT normal  CTA head and neck  Negative for large vessel disease  MRI  pending   2D Echo  EF 55-60%  LDL 125  HgbA1c pending  SCDs for VTE prophylaxis Diet Heart Room service appropriate?: Yes; Fluid consistency:: Thin  aspirin 81 mg daily prior to admission, now on No antithrombotic as within 24h of tPA.   Ongoing aggressive stroke risk factor management  CT neck unremarkable. C collar removed without pain on neck movement. C collar discontinued.  Therapy recommendations:  pending . Ok to be OOB. Ok for therapy to start today  Disposition:  pending   Hx stroke/TIA  06/2015 cerebral ischemia with right HP, cocaine positive. MRI pending  09/2011 Cerebral ischemia  with R HP d/t cocaine use s/p IV tPA. MRI neg - cocaine positive, marijuana positive  03/2011 Cerebral ischemic with R HP and R field cut secondary to vasospasm from cocaine use. MRI neg. cocaine positive and marijuana positive  07/2009 treated for stroke at [facility]. MRI neg. Cocaine positiv  Cocaine use  UDS positive again  Cessation counseling provided  Pt is willing to quit  Hypertension  Stable  Hyperlipidemia  Home meds:  No statin  LDL 125, goal < 70  lipitor 20mg  added   Continue statin at discharge  Headache - ? Hemiplegic migraine  Possibly associated with cocaine use  Tylenol helps  Consider familial hemiplegic migraine   Quit cocaine first  If recurrent without cocaine use, may consider calcium blocker or diamox  Tobacco abuse  Current smoker  Smoking cessation counseling provided  Pt is willing to quit  Other Stroke Risk Factors  ETOH use  Hx of THC use, UDS negative this admission  Family hx stroke (father)   Hospital day # 1  Derek French See Amion for Pager information 07/02/2015 11:11 AM   I, the attending vascular neurologist, have personally obtained a history, examined the patient, evaluated laboratory data, individually viewed imaging studies and agree with radiology interpretations. Together with the NP/PA, we formulated the assessment and plan of care which reflects our mutual decision.  I have made any additions or clarifications directly to the above note and agree with the findings and plan as currently documented.   50 yo M with multiple episodes of prior stroke like symptoms requiring tPA and positive cocaine use admitted for another episodes of right hemiparesis with cocaine use and full recovery. Admitted HA after the start of episode. Seems all episodes associated with cocaine use, either cocaine induced vasospasm (RCVA ?) or induced HA with hemiplegic feature. S/p tPA, pending MRI. Pt needs quit  cocaine. Resume ASA after MRI if no bleeding. need PT/OT.    Marvel Plan, MD PhD Stroke Neurology 07/02/2015 4:02 PM       To contact Stroke Continuity provider, please refer to [email or web address]. After hours, contact General Neurology

## 2015-07-02 NOTE — Progress Notes (Signed)
PT Cancellation Note  Patient Details Name: Derek PallRobert M [name] MRN: [id number] DOB: 04/15/1965   Cancelled Treatment:    Reason Eval/Treat Not Completed: Patient not medically ready. Pt on strict bedrest until 645pm due to receiving tPA. PT to return 5/11 as able to complete eval.   Marcene BrawnChadwell, [name] 07/02/2015, 7:34 AM   Lewis ShockAshly Renwick Asman, PT, DPT Pager #: [phone] Office #: [phone]

## 2015-07-03 DIAGNOSIS — G43409 Hemiplegic migraine, not intractable, without status migrainosus: Secondary | ICD-10-CM

## 2015-07-03 LAB — HEMOGLOBIN A1C
HEMOGLOBIN A1C: 6 % — AB (ref 4.8–5.6)
Mean Plasma Glucose: 126 mg/dL

## 2015-07-03 NOTE — Progress Notes (Signed)
STROKE TEAM PROGRESS NOTE   SUBJECTIVE (INTERVAL HISTORY) Up with therapy in hall, they state he did pretty well but recommend CIR.  Now up in chair. No new complaints.    OBJECTIVE Temp:  [97.7 F (36.5 C)-98.3 F (36.8 C)] 98.3 F (36.8 C) (05/11 1029) Pulse Rate:  [58-80] 60 (05/11 1029) Cardiac Rhythm:  [-] Normal sinus rhythm (05/11 0700) Resp:  [16-18] 18 (05/11 1029) BP: (119-137)/(72-99) 128/72 mmHg (05/11 1029) SpO2:  [93 %-98 %] 98 % (05/11 1029)  CBC:   Recent Labs Lab 07/01/15 1820 07/01/15 1825  WBC 8.5  --   NEUTROABS 4.5  --   HGB 14.0 15.6  HCT 41.3 46.0  MCV 91.8  --   PLT 262  --    Basic Metabolic Panel:   Recent Labs Lab 07/01/15 1820 07/01/15 1825  NA 140 140  K 3.8 4.0  CL 108 105  CO2 23  --   GLUCOSE 132* 110*  BUN 7 9  CREATININE 0.99 0.90  CALCIUM 9.2  --    Lipid Panel:     Component Value Date/Time   CHOL 175 07/02/2015 0602   TRIG 40 07/02/2015 0602   HDL 42 07/02/2015 0602   CHOLHDL 4.2 07/02/2015 0602   VLDL 8 07/02/2015 0602   LDLCALC 125* 07/02/2015 0602   HgbA1c:  Lab Results  Component Value Date   HGBA1C 6.0* 07/02/2015   Urine Drug Screen:     Component Value Date/Time   LABOPIA NONE DETECTED 07/02/2015 0055   COCAINSCRNUR POSITIVE* 07/02/2015 0055   LABBENZ NONE DETECTED 07/02/2015 0055   AMPHETMU NONE DETECTED 07/02/2015 0055   THCU NONE DETECTED 07/02/2015 0055   LABBARB NONE DETECTED 07/02/2015 0055     IMAGING  Ct Head Wo Contrast 07/01/2015  No acute intracranial hemorrhage.  07/01/2015  1. No acute intracranial findings. 2. Minimal chronic right maxillary sinusitis.   Ct Angio Head & Neck W/cm &/or Wo/cm 07/01/2015   Negative CTA head and neck. No atherosclerotic disease dissection or occlusion. Enlargement of the right sternocleidomastoid muscle in the lower neck. This may be due to muscle injury from fall.   Ct Cervical Spine Wo Contrast 07/01/2015   No acute intracranial hemorrhage.   TTE -  -  Left ventricle: The cavity size was normal. Systolic function was normal. The estimated ejection fraction was in the range of 55% to 60%. Wall motion was normal; there were no regional wall motion abnormalities. Doppler parameters are consistent with abnormal left ventricular relaxation (grade 1 diastolic dysfunction). Doppler parameters are consistent with indeterminate ventricular filling pressure. - Aortic valve: Transvalvular velocity was within the normal range. There was no stenosis. There was moderate regurgitation. Regurgitation pressure half-time: 332 ms. - Mitral valve: Transvalvular velocity was within the normal range. There was no evidence for stenosis. There was no regurgitation. - Right ventricle: The cavity size was normal. Wall thickness was normal. Systolic function was normal. - Atrial septum: No defect or patent foramen ovale was identified. - Tricuspid valve: There was no regurgitation. - Inferior vena cava: The vessel was normal in size. The respirophasic diameter changes were in the normal range (>= 50%).  MRI -  1. Stable mild periventricular white matter changes, likely reflecting the sequela of microvascular ischemia. 2. No acute intracranial abnormality or significant interval change.   PHYSICAL EXAM Temp:  [97.7 F (36.5 C)-98.3 F (36.8 C)] 98.3 F (36.8 C) (05/11 1029) Pulse Rate:  [58-80] 60 (05/11 1029) Resp:  [16-18] 18 (  05/11 1029) BP: (119-137)/(72-99) 128/72 mmHg (05/11 1029) SpO2:  [93 %-98 %] 98 % (05/11 1029)  General - Well nourished, well developed, in no apparent distress, on C-collar.  Ophthalmologic - Fundi not visualized due to small pupils.  Cardiovascular - Regular rate and rhythm.  Mental Status -  Level of arousal and orientation to time, place, and person were intact. Language including expression, naming, repetition, comprehension was assessed and found intact. Fund of Knowledge was assessed and was intact.  Cranial Nerves II - XII  - II - right partial hemianopia. III, IV, VI - Extraocular movements intact. V - Facial sensation decreased on the right. However, midline split with tuning fork exam. VII - Facial movement intact bilaterally. VIII - Hearing & vestibular intact bilaterally. X - Palate elevates symmetrically. XI - Chin turning & shoulder shrug intact bilaterally. XII - Tongue protrusion intact.  Motor Strength - The patient's strength was RUE 3/5 proximal and 4/5 distal, RLE 3/5 proximal and distal and RUE drift. However, the drift is without pronator drift. Hoover sign test inconsistent. LUE and LLE 5/5.  Bulk was normal and fasciculations were absent.   Motor Tone - Muscle tone was assessed at the neck and appendages and was normal.  Reflexes - The patient's reflexes were 1+ in all extremities and he had no pathological reflexes.  Sensory - Light touch, temperature/pinprick were assessed and were decreased on the right.    Coordination - The patient had normal movements in the right hand with no ataxia or dysmetria.  Tremor was absent.  Gait and Station - deferred to PT.   ASSESSMENT/PLAN [name] is a 50 y.o. male with history of reported stroke (hospital records are negative) and polysubstance abuse presenting following a fall with aphasia which resolved -? R HP and R field cut. He received IV t-PA 07/01/2015 at 1819.   Right hemiparesis S/p IV tPA - cocaine induced vasospasm vs. Hemiplegic migraine. Since all strokes so far related to cocaine use, stroke symptoms likely related to cocaine. It is also possible that cocaine use triggers HA leading to hemiplegic migraine. Pt needs to quit cocaine. However, pt does have functional component on exam.  Resultant R HP and R field cut. Mild headache  Initial CT normal  CTA head and neck  Negative for large vessel disease  MRI  No acute stroke  2D Echo  EF 55-60%  LDL 125  HgbA1c 6.0  SCDs for VTE prophylaxis Diet Heart Room service  appropriate?: Yes; Fluid consistency:: Thin  aspirin 81 mg daily prior to admission, now on ASA 325mg .   Ongoing aggressive stroke risk factor management  CT neck unremarkable. C collar removed without pain on neck movement. C collar discontinued.  Therapy recommendations:  CIR   Disposition:  Pending. Have requested CIR consult to reconsider.   Hx stroke/TIA  06/2015 cerebral ischemia with right HP, cocaine positive. MRI negative  09/2011 Cerebral ischemia with R HP d/t cocaine use s/p IV tPA. MRI neg - cocaine positive, marijuana positive  03/2011 Cerebral ischemic with R HP and R field cut secondary to vasospasm from cocaine use. MRI neg. cocaine positive and marijuana positive  07/2009 treated for stroke at [facility]. MRI neg. Cocaine positiv  Cocaine use  UDS positive again  Cessation counseling provided  Pt is willing to quit  Hypertension  Stable  Hyperlipidemia  Home meds:  No statin  LDL 125, goal < 70  lipitor 20mg  added   Continue statin at discharge  Headache - ? Hemiplegic migraine  Possibly associated with cocaine use  Tylenol helps  Consider familial hemiplegic migraine   Quit cocaine first  If recurrent without cocaine use, may consider calcium blocker or diamox  Tobacco abuse  Current smoker  Smoking cessation counseling provided  Pt is willing to quit  Other Stroke Risk Factors  ETOH use  Hx of THC use, UDS negative this admission  Family hx stroke (father)   Hospital day # 2  Derek PlanJindong Charice Zuno, MD PhD Stroke Neurology 07/03/2015 6:05 PM      To contact Stroke Continuity provider, please refer to [email or web address]. After hours, contact General Neurology

## 2015-07-03 NOTE — H&P (Signed)
Physical Medicine and Rehabilitation Consult Reason for Consult: CVA secondary to cocaine use Referring Physician: Dr.Xu    HPI: Derek French is a 50 y.o.right handed male with history of headache as well as polysubstance cocaine abuse and tobacco abuse. Patient lives with mother. Independent prior to admission. One level home. Mother can assist on discharge. Presented 07/01/2015 with right sided weakness and headache. He had recently been using cocaine. He was aphasic upon EMS arrival. CT of the head negative. Urine drug screen positive cocaine. Patient did receive TPA. Echocardiogram with ejection fraction of 60% grade 1 diastolic dysfunction. CT angio of head and neck negative. MRI of the brain no acute intracranial abnormalities. Neurology follow-up suspect CVA induced from cocaine use. Currently on aspirin for CVA prophylaxis. Subcutaneous Lovenox for DVT prophylaxis. Tolerating a regular diet.  PT indicates that the patient functional abilities do not match up well with his manual muscle test. OT also notes inconsistency with function and motor testing   Review of Systems  Constitutional: Negative for fever and chills.  Eyes: Negative for double vision.  Respiratory: Negative for cough and shortness of breath.  Cardiovascular: Negative for chest pain, palpitations and leg swelling.  Gastrointestinal: Positive for constipation. Negative for nausea and vomiting.  Genitourinary: Negative for dysuria and hematuria.  Neurological: Positive for speech change, weakness and headaches. Negative for seizures.  All other systems reviewed and are negative.  Past Medical History  Diagnosis Date  . Recurrent boils   . Headache(784.0)   . Stroke-like episode (HCC)     07/2009, 03/2011, 09/2011  . Cocaine use    Past Surgical History  Procedure Laterality Date  . Hernia repair     Family History  Problem Relation Age of Onset  . Stroke Father    Social  History:  reports that he has been smoking Cigarettes. He has been smoking about 1.00 pack per day. He does not have any smokeless tobacco history on file. He reports that he drinks alcohol. He reports that he uses illicit drugs (Marijuana). Allergies:  Allergies  Allergen Reactions  . Bee Venom Anaphylaxis  . Codeine Other (See Comments)    hallucinations    Medications Prior to Admission  Medication Sig Dispense Refill  . aspirin EC 81 MG tablet Take 81 mg by mouth daily.    . diphenhydrAMINE (BENADRYL) 25 MG tablet Take 1 tablet (25 mg total) by mouth every 6 (six) hours. (Patient not taking: Reported on 07/02/2015) 12 tablet 0  . EPINEPHrine (EPIPEN 2-PAK) 0.3 mg/0.3 mL IJ SOAJ injection Inject 0.3 mLs (0.3 mg total) into the muscle once. 1 Device 1  . famotidine (PEPCID) 20 MG tablet Take 1 tablet (20 mg total) by mouth 2 (two) times daily. (Patient not taking: Reported on 07/02/2015) 10 tablet 0  . predniSONE (DELTASONE) 10 MG tablet Take 6 tablets (60 mg total) by mouth daily. (Patient not taking: Reported on 07/02/2015) 30 tablet 0    Home: Home Living Family/patient expects to be discharged to:: Private residence Living Arrangements: Parent (mom) Available Help at Discharge: Family, Available 24 hours/day Type of Home: House Home Access: Level entry Home Layout: One level Bathroom Shower/Tub: Engineer, manufacturing systems: Standard Home Equipment: Environmental consultant - 2 wheels, Bedside commode, Tub bench, Cane - single point Lives With: Other (Comment) (mom)  Functional History: Prior Function Level of Independence: Independent Comments: works at a [employer], does not drive Functional Status:  Mobility: Bed Mobility Overal bed mobility: Needs Assistance Bed Mobility: Supine  to Sit Supine to sit: Min guard, HOB elevated General bed mobility comments: Increased time to get to EOB with some difficulty bringing RLE to EOB.  No assist needed.  Transfers Overall transfer level: Needs assistance Equipment used: None Transfers: Sit to/from Stand Sit to Stand: Min assist General transfer comment: Min A to boost from EOB with cues for RLE placement as RLE too far extended prior to standing. transferred to chair post ambulation bout. Ambulation/Gait Ambulation/Gait assistance: Mod assist Ambulation Distance (Feet): 100 Feet Assistive device: 1 person hand held assist Gait Pattern/deviations: Step-to pattern, Step-through pattern, Decreased stride length, Decreased step length - right, Decreased weight shift to left, Decreased dorsiflexion - right, Narrow base of support General Gait Details: Pt with foot drop and difficulty advancing RLE during gait training. No knee instability noted (buckling or knee hyperextension) despite 2+/5 during MMT, good quad control; Fatigues. Cues to use visual input for foot placement. Gait velocity: decreased Gait velocity interpretation: Below normal speed for age/gender    ADL: ADL Overall ADL's : Needs assistance/impaired Eating/Feeding: Modified independent, Bed level Eating/Feeding Details (indicate cue type and reason): reports leading with L hand Grooming: Wash/dry hands, Standing, Min guard Upper Body Bathing: Minimal assitance, Sitting Lower Body Bathing: Minimal assistance, Sit to/from stand Upper Body Dressing : Minimal assistance, Sitting Lower Body Dressing: Minimal assistance, Sit to/from stand Lower Body Dressing Details (indicate cue type and reason): doffed and donned R sock independently using both hands Toilet Transfer: Moderate assistance, Ambulation Toileting- Clothing Manipulation and Hygiene: Minimal assistance, Sit to/from stand  Cognition: Cognition Overall Cognitive Status: Within Functional Limits for tasks assessed Orientation Level: Oriented to person, Oriented to place, Oriented to time, Disoriented to situation Attention: Sustained,  Selective Sustained Attention: Appears intact Selective Attention: Appears intact Memory: Impaired Memory Impairment: Retrieval deficit (recalled 3/5 words independently, 2 with cues) Awareness: Appears intact (awareness to sensorimotor deficits and expressive language changes) Problem Solving: Appears intact (need to call for assist) Safety/Judgment: Appears intact Cognition Arousal/Alertness: Awake/alert Behavior During Therapy: WFL for tasks assessed/performed Overall Cognitive Status: Within Functional Limits for tasks assessed  Blood pressure 128/72, pulse 60, temperature 98.3 F (36.8 C), temperature source Oral, resp. rate 18, height  (1.88 m), weight 82.373 kg (181 lb 9.6 oz), SpO2 98 %. Physical Exam  Constitutional: He appears well-developed.  HENT:  Head: Normocephalic.  Poor dentition  Eyes: EOM are normal.  Neck: Normal range of motion. Neck supple. No thyromegaly present.  Cardiovascular: Normal rate and regular rhythm.  Respiratory: Effort normal and breath sounds normal. No respiratory distress.  GI: Soft. Bowel sounds are normal. He exhibits no distension.  Neurological: He is alert.  Patient is oriented to person, place, date of birth and president of the [place]. Follows simple commands.  Skin: Skin is warm and dry.  4/5 in the right deltoid, biceps, triceps, grip 4/5 in the right hip flexor knee extensor ankle dorsiflexion and plantar flexor Left side is 5/5 Tone is normal in the right upper and right lower extremity Deep tendon reflexes are normal in bilateral upper and lower extremity Babinski is negative on the right side   Lab Results Last 24 Hours    No results found for this or any previous visit (from the past 24 hour(s)).    Imaging Results (Last 48 hours)    Ct Angio Head W/cm &/or Wo Cm  07/01/2015 CLINICAL DATA: Fall today. Code stroke. TPA given prior to this examination. EXAM: CT ANGIOGRAPHY HEAD AND NECK TECHNIQUE: Multidetector  CT imaging of the head and neck was performed using the standard protocol during bolus administration of intravenous contrast. Multiplanar CT image reconstructions and MIPs were obtained to evaluate the vascular anatomy. Carotid stenosis measurements (when applicable) are obtained utilizing NASCET criteria, using the distal internal carotid diameter as the denominator. CONTRAST: 80 mL Isovue 370 IV COMPARISON: CT head 07/01/2015 FINDINGS: CTA NECK Aortic arch: Normal aortic arch. Bovine branching pattern. Proximal great vessels widely patent. Mild emphysema in the apices. Right carotid system: Right carotid artery widely patent. Right carotid bifurcation normal. No atherosclerotic disease or dissection Left carotid system: Left carotid widely patent. Normal carotid bifurcation without stenosis or dissection Vertebral arteries:Both vertebral arteries appear normal and patent to the basilar. No dissection or stenosis. Skeleton: Disc degeneration and spurring at C4-5. No acute skeletal abnormality. Other neck: Asymmetric enlargement of the right sternocleidomastoid muscle. This could be due to hematoma from fall today. No mass lesion. CTA HEAD Anterior circulation: Cavernous carotid widely patent bilaterally without stenosis or calcification. Anterior and middle cerebral arteries widely patent bilaterally. Posterior circulation: Both vertebral arteries patent patent to the basilar. PICA patent bilaterally. Basilar is small but patent. Fetal origin right posterior cerebral artery. Superior cerebellar and posterior cerebral arteries patent bilaterally without stenosis. Venous sinuses: Not well opacified. Anatomic variants: Negative for aneurysm Delayed phase: Not performed IMPRESSION: Negative CTA head and neck. No atherosclerotic disease dissection or occlusion. Enlargement of the right sternocleidomastoid muscle in the lower neck. This may be due to muscle injury from fall. These results were discussed in person  07/01/2015 at 6:48 Pm to Dr. Ritta Slot , who verbally acknowledged these results. Electronically Signed By: Marlan Palau M.D. On: 07/01/2015 19:02   Ct Head Wo Contrast  07/01/2015 CLINICAL DATA: 50 year old male status post tPA administration for code stroke now with sudden onset of headache. EXAM: CT HEAD WITHOUT CONTRAST TECHNIQUE: Contiguous axial images were obtained from the base of the skull through the vertex without intravenous contrast. COMPARISON: Earlier head CT and CT angiography dated 07/01/2015 FINDINGS: The ventricles and sulci are appropriate in size for the patient's age. There is no intracranial hemorrhage. No mass effect or midline shift identified. The gray-white matter differentiation is preserved. There is no extra-axial fluid collection. The visualized paranasal sinuses and mastoid air cells are well aerated. The calvarium is intact. IMPRESSION: No acute intracranial hemorrhage. Electronically Signed By: Elgie Collard M.D. On: 07/01/2015 19:26   Ct Head Wo Contrast  07/01/2015 CLINICAL DATA: Fall today in the bathroom. Right-sided numbness. Code stroke. EXAM: CT HEAD WITHOUT CONTRAST TECHNIQUE: Contiguous axial images were obtained from the base of the skull through the vertex without intravenous contrast. COMPARISON: 09/27/2011 and 09/26/2011. FINDINGS: The brainstem, cerebellum, cerebral peduncles, thalami, basal ganglia, basilar cisterns, and ventricular system appear within normal limits. No intracranial hemorrhage, mass lesion, or acute CVA. Mucosal swelling in the nasal cavity. Minimal mucosal thickening in the right maxillary sinus. IMPRESSION: 1. No acute intracranial findings. 2. Minimal chronic right maxillary sinusitis. These results were called by telephone at the time of interpretation on 07/01/2015 at 6:32 pm to Dr. Amada Jupiter, who verbally acknowledged these results. Electronically Signed By: Gaylyn Rong M.D. On: 07/01/2015 18:35   Ct  Angio Neck W/cm &/or Wo/cm  07/01/2015 CLINICAL DATA: Fall today. Code stroke. TPA given prior to this examination. EXAM: CT ANGIOGRAPHY HEAD AND NECK TECHNIQUE: Multidetector CT imaging of the head and neck was performed using the standard protocol during bolus administration of intravenous contrast. Multiplanar CT image reconstructions and MIPs  were obtained to evaluate the vascular anatomy. Carotid stenosis measurements (when applicable) are obtained utilizing NASCET criteria, using the distal internal carotid diameter as the denominator. CONTRAST: 80 mL Isovue 370 IV COMPARISON: CT head 07/01/2015 FINDINGS: CTA NECK Aortic arch: Normal aortic arch. Bovine branching pattern. Proximal great vessels widely patent. Mild emphysema in the apices. Right carotid system: Right carotid artery widely patent. Right carotid bifurcation normal. No atherosclerotic disease or dissection Left carotid system: Left carotid widely patent. Normal carotid bifurcation without stenosis or dissection Vertebral arteries:Both vertebral arteries appear normal and patent to the basilar. No dissection or stenosis. Skeleton: Disc degeneration and spurring at C4-5. No acute skeletal abnormality. Other neck: Asymmetric enlargement of the right sternocleidomastoid muscle. This could be due to hematoma from fall today. No mass lesion. CTA HEAD Anterior circulation: Cavernous carotid widely patent bilaterally without stenosis or calcification. Anterior and middle cerebral arteries widely patent bilaterally. Posterior circulation: Both vertebral arteries patent patent to the basilar. PICA patent bilaterally. Basilar is small but patent. Fetal origin right posterior cerebral artery. Superior cerebellar and posterior cerebral arteries patent bilaterally without stenosis. Venous sinuses: Not well opacified. Anatomic variants: Negative for aneurysm Delayed phase: Not performed IMPRESSION: Negative CTA head and neck. No atherosclerotic disease  dissection or occlusion. Enlargement of the right sternocleidomastoid muscle in the lower neck. This may be due to muscle injury from fall. These results were discussed in person 07/01/2015 at 6:48 Pm to Dr. Ritta SlotMCNEILL KIRKPATRICK , who verbally acknowledged these results. Electronically Signed By: Marlan Palauharles Derks M.D. On: 07/01/2015 19:02   Ct Cervical Spine Wo Contrast  07/01/2015 CLINICAL DATA: 50 year old male with fall. Code stroke. EXAM: CT CERVICAL SPINE WITHOUT CONTRAST TECHNIQUE: Multidetector CT imaging of the cervical spine was performed without intravenous contrast. Multiplanar CT image reconstructions were also generated. COMPARISON: Multiple earlier head CT andCTA studies dated 07/01/2015 FINDINGS: The ventricles and the sulci are appropriate in size for the patient's age. There is no intracranial hemorrhage. No midline shift or mass effect identified. The gray-white matter differentiation is preserved. The visualized paranasal sinuses and mastoid air cells are well aerated. The calvarium is intact. IMPRESSION: No acute intracranial hemorrhage. Electronically Signed By: Elgie CollardArash Radparvar M.D. On: 07/01/2015 20:58   Mr Brain Wo Contrast  07/02/2015 CLINICAL DATA: Sudden onset right-sided weakness. Recent cocaine use. Severe right-sided headaches. IV tPA administered 24 hours previous. EXAM: MRI HEAD WITHOUT CONTRAST TECHNIQUE: Multiplanar, multiecho pulse sequences of the brain and surrounding structures were obtained without intravenous contrast. COMPARISON: None. FINDINGS: Mild periventricular T2 hyperintensities are similar to the prior exam. These are slightly advanced for age. No acute infarct, hemorrhage, or mass lesion is present. The ventricles are of normal size. The internal auditory canals are within normal limits bilaterally. The brainstem and cerebellum are normal. Flow is present in the major intracranial arteries. The globes and orbits are intact. Mild mucosal thickening is  present in the maxillary sinuses bilaterally. Remaining paranasal sinuses and the mastoid air cells are clear. The skullbase is within normal limits. Midline sagittal images are unremarkable. IMPRESSION: 1. Stable mild periventricular white matter changes, likely reflecting the sequela of microvascular ischemia. 2. No acute intracranial abnormality or significant interval change. Electronically Signed By: Marin Robertshristopher Mattern M.D. On: 07/02/2015 18:23     Assessment/Plan: Diagnosis: Right hemiparesis without imaging findings for CVA, inconsistent examination and functional status 1. Does the need for close, 24 hr/day medical supervision in concert with the patient's rehab needs make it unreasonable for this patient to be served in a less  intensive setting? No 2. Co-Morbidities requiring supervision/potential complications: Cocaine abuse 3. Due to patient education, does the patient require 24 hr/day rehab nursing? No 4. Does the patient require coordinated care of a physician, rehab nurse, PT, OT to address physical and functional deficits in the context of the above medical diagnosis(es)? No Addressing deficits in the following areas: balance, locomotion, strength and transferring 5. Can the patient actively participate in an intensive therapy program of at least 3 hrs of therapy per day at least 5 days per week? Yes 6. The potential for patient to make measurable gains while on inpatient rehab is Not applicable 7. Anticipated functional outcomes upon discharge from inpatient rehab are n/a with PT, n/a with OT, n/a with SLP. 8. Estimated rehab length of stay to reach the above functional goals is: NA 9. Does the patient have adequate social supports and living environment to accommodate these discharge functional goals? Potentially 10. Anticipated D/C setting: Home 11. Anticipated post D/C treatments: HH therapy 12. Overall Rehab/Functional Prognosis: good  RECOMMENDATIONS: This patient's  condition is appropriate for continued rehabilitative care in the following setting: [facility] Therapy Patient has agreed to participate in recommended program. Yes Note that insurance prior authorization may be required for reimbursement for recommended care.  Comment: No objective evidence of CVA with inconsistent clinical findings suggest highlighting of symptoms or conversion rxn, have reassured pt that function should improve and he can d/c to home in 1-2 d    07/03/2015

## 2015-07-03 NOTE — Progress Notes (Signed)
Speech Language Pathology Treatment: Cognitive-Linquistic  Patient Details Name: Derek French MRN: [id number] DOB: 05/19/1965 Today's Date: 07/03/2015 Time: 1239-3594 SLP Time Calculation (min) (ACUTE ONLY): 15 min  Assessment / Plan / Recommendation Clinical Impression  Pt improved from initial assessment. Language is fluent without evidence of dysnomia and pt reports significant improvement. Demonstrated anticipatory awareness when verbalized reasoning for why he must call for assistance to mobilize. Educated pt on strategies for working memory to utilize for relevant information. No further ST needed in acute care.    HPI HPI: 50 yo male adm to [facility] after falling with right sided weakness and heachache.  PMH for cocaine use, CVA 2011.  Pt CT cervical spine and head negative.  He was aphasic upon transferring to hospital but improvement in expressive languge apparent.  Speech evaluation ordered.       SLP Plan  All goals met;Discharge SLP treatment due to (comment)     Recommendations                Oral Care Recommendations: Oral care BID Follow up Recommendations: Inpatient Rehab Plan: All goals met;Discharge SLP treatment due to (comment)     GO                [name] 07/03/2015, 3:14 PM  [name] Colvin Caroli.Ed [employer] [phone]

## 2015-07-03 NOTE — Progress Notes (Signed)
Inpatient Rehabilitation  Per PT request, patient was screened by Fae PippinMelissa Torence Palmeri for appropriateness for an Inpatient Acute Rehab consult.  At this time there is no clear diagnosis that constitutes and an Inpatient Rehab consult; "etiology unclear, cocaine related vasospasm vs. Hemiplegic migraine," per neuro.  Please call with questions.    Charlane FerrettiMelissa Arrington Yohe, M.A., CCC/SLP Admission Coordinator  [facility]  Cell [phone]

## 2015-07-03 NOTE — Progress Notes (Deleted)
As noted by admission coordinator. No working diagnosis currently to justify inpatient rehabilitation services. MRI is negative. Etiology remains unclear. At this time will hold on formal rehabilitation consult continue therapies and monitor

## 2015-07-03 NOTE — Evaluation (Signed)
Occupational Therapy Evaluation Patient Details Name: Derek PallRobert M French MRN: [id number] DOB: 03/04/1965 Today's Date: 07/03/2015    History of Present Illness Patient is a 50 y/o male with hx of HA, reported stroke (hospital records are negative) and polysubstance abuse presenting following a fall with aphasia which resolved, and right sided weakness. s/p IV t-PA 07/01/2015 at 1819. CT and MRI- unremarkable.    Clinical Impression   Pt was independent prior to admission. Presents with R side weakness and impaired sensation, R field cut and impaired balance interfering with ability to perform ADL and ADL transfers. Pt functionally using R UE remarkably well compared to formal testing. Anticipate he will progress quickly. Will follow acutely.    Follow Up Recommendations  CIR    Equipment Recommendations  None recommended by OT    Recommendations for Other Services       Precautions / Restrictions Precautions Precautions: Fall Precaution Comments: right sided weakness Restrictions Weight Bearing Restrictions: No      Mobility Bed Mobility Overal bed mobility: Needs Assistance Bed Mobility: Supine to Sit     Supine to sit: Min guard;HOB elevated     General bed mobility comments: Increased time to get to EOB with some difficulty bringing RLE to EOB. No assist needed.   Transfers Overall transfer level: Needs assistance Equipment used: None Transfers: Sit to/from Stand Sit to Stand: Min assist         General transfer comment: Min A to boost from EOB with cues for RLE placement as RLE too far extended prior to standing. transferred to chair post ambulation bout.    Balance Overall balance assessment: Needs assistance Sitting-balance support: Feet supported;No upper extremity supported Sitting balance-Leahy Scale: Normal Sitting balance - Comments: Able to reach outside boS to donn/doff socks without difficulty.    Standing balance support: During functional  activity Standing balance-Leahy Scale: Good Standing balance comment: Able to stand at sink and wash hands without difficulty, no knee buckling. Min guard for safety.                             ADL Overall ADL's : Needs assistance/impaired Eating/Feeding: Modified independent;Bed level Eating/Feeding Details (indicate cue type and reason): reports leading with L hand Grooming: Wash/dry hands;Standing;Min guard   Upper Body Bathing: Minimal assitance;Sitting   Lower Body Bathing: Minimal assistance;Sit to/from stand   Upper Body Dressing : Minimal assistance;Sitting   Lower Body Dressing: Minimal assistance;Sit to/from stand Lower Body Dressing Details (indicate cue type and reason): doffed and donned R sock independently using both hands Toilet Transfer: Moderate assistance;Ambulation   Toileting- Clothing Manipulation and Hygiene: Minimal assistance;Sit to/from stand               Vision Additional Comments: R field cut   Perception     Praxis      Pertinent Vitals/Pain Pain Assessment: No/denies pain     Hand Dominance Right   Extremity/Trunk Assessment Upper Extremity Assessment Upper Extremity Assessment: RUE deficits/detail RUE Deficits / Details: 4-/5 strength, uses R hand functionally for B ADL RUE Sensation: decreased light touch;decreased proprioception RUE Coordination: decreased fine motor;decreased gross motor (mild impairment)   Lower Extremity Assessment Lower Extremity Assessment: Defer to PT evaluation RLE Deficits / Details: Grossly ~2+/5 knee extension, 2/5 hip flexion, 2/5 DF, 2/5 knee flexion. RLE Sensation: decreased light touch;decreased proprioception RLE Coordination: decreased fine motor;decreased gross motor   Cervical / Trunk Assessment Cervical / Trunk Assessment: Normal  Communication Communication Communication: No difficulties   Cognition Arousal/Alertness: Awake/alert Behavior During Therapy: WFL for tasks  assessed/performed Overall Cognitive Status: Within Functional Limits for tasks assessed                     General Comments       Exercises       Shoulder Instructions      Home Living Family/patient expects to be discharged to:: Private residence Living Arrangements: Parent (mom) Available Help at Discharge: Family;Available 24 hours/day Type of Home: House Home Access: Level entry     Home Layout: One level     Bathroom Shower/Tub: Chief Strategy Officer: Standard     Home Equipment: Environmental consultant - 2 wheels;Bedside commode;Tub bench;Gilmer Mor - single point      Lives With: Other (Comment) (mom)    Prior Functioning/Environment Level of Independence: Independent        Comments: works at a [employer], does not drive    OT Diagnosis: Hemiplegia dominant side;Disturbance of vision   OT Problem List: Decreased strength;Impaired balance (sitting and/or standing);Impaired vision/perception;Decreased coordination;Impaired UE functional use;Impaired sensation   OT Treatment/Interventions: Self-care/ADL training;Neuromuscular education;DME and/or AE instruction;Therapeutic activities;Patient/family education;Visual/perceptual remediation/compensation;Balance training    OT Goals(Current goals can be found in the care plan section) Acute Rehab OT Goals Patient Stated Goal: "to return to how i was before" OT Goal Formulation: With patient Time For Goal Achievement: 07/10/15 Potential to Achieve Goals: Good ADL Goals Pt Will Perform Eating: with modified independence;sitting Pt Will Perform Grooming: with modified independence;standing Pt Will Perform Upper Body Dressing: with modified independence;sitting Pt Will Perform Lower Body Dressing: with modified independence;sit to/from stand Pt Will Transfer to Toilet: with modified independence;ambulating;regular height toilet Pt Will Perform Toileting - Clothing Manipulation and hygiene: with  modified independence;sit to/from stand Pt Will Perform Tub/Shower Transfer: Tub transfer;with modified independence;ambulating;tub bench Pt/caregiver will Perform Home Exercise Program: Increased strength;Right Upper extremity;With theraband;With theraputty;Independently Additional ADL Goal #1: pt will use compensatory strategies for R field cut independently.  OT Frequency: Min 3X/week   Barriers to D/C:            Co-evaluation PT/OT/SLP Co-Evaluation/Treatment: Yes Reason for Co-Treatment: For patient/therapist safety PT goals addressed during session: Mobility/safety with mobility;Strengthening/ROM;Balance OT goals addressed during session: ADL's and self-care      End of Session    Activity Tolerance: Patient tolerated treatment well Patient left: in chair;with call bell/phone within reach;with chair alarm set   Time: [phone] OT Time Calculation (min): 30 min Charges:  OT General Charges $OT Visit: 1 Procedure OT Evaluation $OT Eval Moderate Complexity: 1 Procedure G-Codes:    Evern Bio 07/03/2015, 9:59 AM  [phone]

## 2015-07-03 NOTE — Evaluation (Signed)
Physical Therapy Evaluation Patient Details Name: Derek PallRobert M Fitchett MRN: [id number] DOB: 09/24/1965 Today's Date: 07/03/2015   History of Present Illness  Patient is a 50 y/o male with hx of HA, reported stroke (hospital records are negative) and polysubstance abuse presenting following a fall with aphasia which resolved, and right sided weakness. s/p IV t-PA 07/01/2015 at 1819. CT and MRI- unremarkable.   Clinical Impression  Patient presents with right sided weakness, decreased sensation, proprioception and balance deficits s/p above impacting mobility. Tolerated gait training with Mod A for RLE advancement. OF NOTE: Formal MMT inconsistent with findings during functional assessment. Pt from home with mother and independent and working PTA. High fall risk. Would benefit from CIR to maximize independence and mobility prior to return home. Will follow acutely.      Follow Up Recommendations CIR    Equipment Recommendations  Other (comment) (TBA)    Recommendations for Other Services Rehab consult     Precautions / Restrictions Precautions Precautions: Fall Precaution Comments: right sided weakness Restrictions Weight Bearing Restrictions: No      Mobility  Bed Mobility Overal bed mobility: Needs Assistance Bed Mobility: Supine to Sit     Supine to sit: Min guard;HOB elevated     General bed mobility comments: Increased time to get to EOB with some difficulty bringing RLE to EOB. No assist needed.   Transfers Overall transfer level: Needs assistance Equipment used: None Transfers: Sit to/from Stand Sit to Stand: Min assist         General transfer comment: Min A to boost from EOB with cues for RLE placement as RLE too far extended prior to standing. transferred to chair post ambulation bout.  Ambulation/Gait Ambulation/Gait assistance: Mod assist Ambulation Distance (Feet): 100 Feet Assistive device: 1 person hand held assist Gait Pattern/deviations: Step-to  pattern;Step-through pattern;Decreased stride length;Decreased step length - right;Decreased weight shift to left;Decreased dorsiflexion - right;Narrow base of support Gait velocity: decreased Gait velocity interpretation: Below normal speed for age/gender General Gait Details: Pt with foot drop and difficulty advancing RLE during gait training. No knee instability noted (buckling or knee hyperextension) despite 2+/5 during MMT, good quad control; Fatigues. Cues to use visual input for foot placement.  Stairs            Wheelchair Mobility    Modified Rankin (Stroke Patients Only) Modified Rankin (Stroke Patients Only) Pre-Morbid Rankin Score: No symptoms Modified Rankin: Moderately severe disability     Balance Overall balance assessment: Needs assistance Sitting-balance support: Feet supported;No upper extremity supported Sitting balance-Leahy Scale: Normal Sitting balance - Comments: Able to reach outside boS to donn/doff socks without difficulty.    Standing balance support: During functional activity Standing balance-Leahy Scale: Good Standing balance comment: Able to stand at sink and wash hands without difficulty, no knee buckling. Min guard for safety.                              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    Cervical / Trunk Assessment: Normal  Communication   Communication: No difficulties  Cognition Arousal/Alertness: Awake/alert Behavior During Therapy: WFL for tasks assessed/performed Overall Cognitive Status: Within Functional Limits for tasks assessed                      General Comments General comments (skin integrity, edema, etc.): Discussed disposition - CIR.    Exercises        Assessment/Plan    PT Assessment Patient needs continued PT services  PT Diagnosis Difficulty walking;Abnormality of gait   PT Problem List Decreased strength;Decreased coordination;Decreased range of motion;Decreased balance;Decreased mobility;Impaired sensation;Impaired tone  PT Treatment Interventions Balance training;Gait training;Functional mobility training;Therapeutic activities;Therapeutic exercise;Patient/family education;Neuromuscular re-education   PT Goals (Current goals can be found in the Care Plan section) Acute Rehab PT Goals Patient Stated Goal: "to return to how i was before" PT Goal Formulation: With patient Time For Goal Achievement: 07/17/15 Potential to Achieve Goals: Good    Frequency Min 4X/week   Barriers to discharge Decreased caregiver support Lives with 29 y/o mother    Co-evaluation PT/OT/SLP Co-Evaluation/Treatment: Yes Reason for Co-Treatment: For patient/therapist safety PT goals addressed during session: Mobility/safety with mobility;Strengthening/ROM;Balance OT goals addressed during session: ADL's and self-care       End of Session Equipment Utilized During Treatment: Gait belt Activity Tolerance: Patient tolerated treatment well Patient left: in chair;with call bell/phone within reach;with chair alarm set Nurse Communication: Mobility status         Time: 1610-9604 PT Time Calculation (min) (ACUTE ONLY):  32 min   Charges:   PT Evaluation $PT Eval Moderate Complexity: 1 Procedure     PT G Codes:        Taos Tapp A Travone Georg 07/03/2015, 9:42 AM  Mylo Red, PT, DPT [phone]

## 2015-07-04 DIAGNOSIS — F449 Dissociative and conversion disorder, unspecified: Secondary | ICD-10-CM

## 2015-07-04 DIAGNOSIS — F17200 Nicotine dependence, unspecified, uncomplicated: Secondary | ICD-10-CM

## 2015-07-04 MED ORDER — ATORVASTATIN CALCIUM 20 MG PO TABS: 20.0000 mg | ORAL_TABLET | Freq: Every day | ORAL | Status: DC

## 2015-07-04 MED ORDER — ASPIRIN 325 MG PO TABS: 325.0000 mg | ORAL_TABLET | Freq: Every day | ORAL | Status: DC

## 2015-07-04 MED ORDER — PANTOPRAZOLE SODIUM 40 MG PO TBEC: 40.0000 mg | DELAYED_RELEASE_TABLET | Freq: Every day | ORAL | Status: DC

## 2015-07-04 NOTE — Discharge Instructions (Signed)
1. Find a primary care physician and schedule an appointment. 2. Gradually increase activity as tolerated 3. Stop using alcohol, tobacco, or any illegal substances.

## 2015-07-04 NOTE — Progress Notes (Signed)
Patient is being d/c home. D/c instructions given and patient verbalized understanding. Patient wheeled out of the unit by the unit staff.

## 2015-07-04 NOTE — Care Management Note (Signed)
Case Management Note  Patient Details  Name: Derek French MRN: [id number] Date of Birth: 02/07/1966  Subjective/Objective:                    Action/Plan: Patient discharging home with self care. Orders for [facility] PT/OT however patient does not have insurance and does not have a qualifying diagnosis for HHPT/OT with charity. CM confirmed this with Tiffany with [facility]. Dr Erlinda Hong made aware. PT recommending a rolling walker. CM met with the patient and he is currently refusing the walker. CM will update the bedside RN.   Expected Discharge Date:   (Pending)               Expected Discharge Plan:  [place]  In-House Referral:     Discharge planning Services  CM Consult  Post Acute Care Choice:    Choice offered to:     DME Arranged:    DME Agency:     [place] Arranged:   (PT/OT--pt self pay and does not qualify for charity  for PT/OT) HH Agency:     Status of Service:  Completed, signed off  Medicare Important Message Given:    Date Medicare IM Given:    Medicare IM give by:    Date Additional Medicare IM Given:    Additional Medicare Important Message give by:     If discussed at [place] of Stay Meetings, dates discussed:    Additional Comments:  [name], RN 07/04/2015, 2:14 PM

## 2015-07-04 NOTE — Progress Notes (Signed)
Physical Therapy Treatment Patient Details Name: Derek PallRobert M Buzzelli MRN: [id number] DOB: 12/26/1965 Today's Date: 07/04/2015    History of Present Illness Patient is a 50 y/o male with hx of HA, reported stroke (hospital records are negative) and polysubstance abuse presenting following a fall with aphasia which resolved, and right sided weakness. s/p IV t-PA 07/01/2015 at 1819. CT and MRI- unremarkable.     PT Comments    Patient tolerated tx well. Continues to demo R foot drop. Palpable activation of anterior tibialis with active assisted DF in sitting. Due to denial for CIR and pt' current mobility level recommending HHPT for continued skilled PT services to maximize independence and safety with mobility.   Follow Up Recommendations  Home health PT     Equipment Recommendations  Rolling walker with 5" wheels    Recommendations for Other Services       Precautions / Restrictions Precautions Precautions: Fall Precaution Comments: right sided weakness Restrictions Weight Bearing Restrictions: No    Mobility  Bed Mobility Overal bed mobility: Modified Independent Bed Mobility: Supine to Sit     Supine to sit: Modified independent (Device/Increase time)     General bed mobility comments: increased time and min use of bedrail  Transfers Overall transfer level: Needs assistance Equipment used: None Transfers: Sit to/from Stand Sit to Stand: Min assist         General transfer comment: cues for safe hand/foot placement and assist to power up into standin  Ambulation/Gait Ambulation/Gait assistance: Mod assist Ambulation Distance (Feet): 120 Feet Assistive device: Rolling walker (2 wheeled) Gait Pattern/deviations: Step-through pattern;Decreased stride length;Decreased step length - right;Decreased dorsiflexion - right;Trunk flexed     General Gait Details: assist to advance R LE forward and clear toes; no knee instability; pt with foot drop and inability safely clear  floor without assist; in sitting, palpable activation of anterior tibialis felt with assisted DF    Stairs            Wheelchair Mobility    Modified Rankin (Stroke Patients Only) Modified Rankin (Stroke Patients Only) Pre-Morbid Rankin Score: No symptoms Modified Rankin: Moderately severe disability     Balance     Sitting balance-Leahy Scale: Normal       Standing balance-Leahy Scale: Good                      Cognition Arousal/Alertness: Awake/alert Behavior During Therapy: WFL for tasks assessed/performed Overall Cognitive Status: Within Functional Limits for tasks assessed                      Exercises      General Comments        Pertinent Vitals/Pain Pain Assessment: No/denies pain    Home Living                      Prior Function            PT Goals (current goals can now be found in the care plan section) Acute Rehab PT Goals Patient Stated Goal: go home Progress towards PT goals: Progressing toward goals    Frequency  Min 4X/week    PT Plan Discharge plan needs to be updated    Co-evaluation             End of Session Equipment Utilized During Treatment: Gait belt Activity Tolerance: Patient tolerated treatment well Patient left: in chair;with call bell/phone within reach;with chair alarm set  Time: 1610-9604 PT Time Calculation (min) (ACUTE ONLY): 20 min  Charges:  $Gait Training: 8-22 mins                    G Codes:      Derek Mound, PTA Pager: [phone]   07/04/2015, 9:55 AM

## 2015-07-04 NOTE — Discharge Summary (Signed)
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  Past Medical History  Diagnosis Date  . Recurrent boils   . Headache(784.0)   . Stroke-like episode (HCC)     07/2009, 03/2011, 09/2011  . Cocaine use    Past Surgical History  Procedure Laterality Date  . Hernia repair        Medication List    STOP taking these medications        aspirin EC 81 MG tablet  Replaced by:  aspirin 325 MG tablet     diphenhydrAMINE 25 MG tablet  Commonly known as:  BENADRYL     EPINEPHrine 0.3 mg/0.3 mL Soaj injection  Commonly known as:  EPIPEN 2-PAK     famotidine 20 MG tablet  Commonly known as:  PEPCID     predniSONE 10 MG tablet  Commonly known as:  DELTASONE      TAKE these medications        aspirin 325 MG tablet  Take 1 tablet (325 mg total) by mouth daily.     atorvastatin 20 MG tablet  Commonly known as:  LIPITOR  Take 1 tablet (20 mg total) by mouth daily at 6 PM.        LABORATORY STUDIES CBC    Component Value Date/Time   WBC 8.5 07/01/2015 1820   RBC 4.50 07/01/2015 1820   HGB 15.6 07/01/2015 1825   HCT 46.0 07/01/2015 1825   PLT 262 07/01/2015 1820   MCV 91.8 07/01/2015 1820   MCH 31.1 07/01/2015 1820   MCHC 33.9 07/01/2015 1820   RDW 14.2 07/01/2015 1820   LYMPHSABS 3.0 07/01/2015 1820   MONOABS 0.9 07/01/2015 1820   EOSABS 0.1 07/01/2015 1820   BASOSABS 0.0 07/01/2015 1820   CMP     Component Value Date/Time   NA 140 07/01/2015 1825   K 4.0 07/01/2015 1825   CL 105 07/01/2015 1825   CO2 23 07/01/2015 1820   GLUCOSE 110* 07/01/2015 1825   BUN 9 07/01/2015 1825   CREATININE 0.90 07/01/2015 1825   CALCIUM 9.2 07/01/2015 1820   PROT 6.4* 07/01/2015 1820   ALBUMIN 3.5 07/01/2015 1820   AST 19 07/01/2015 1820   ALT 16* 07/01/2015 1820   ALKPHOS 49 07/01/2015 1820   BILITOT 0.9 07/01/2015 1820   GFRNONAA >60 07/01/2015 1820   GFRAA >60 07/01/2015 1820   COAGS Lab Results  Component Value Date   INR 1.11 07/01/2015   INR 1.07 09/26/2011   INR 1.00 03/28/2011   Lipid Panel    Component Value Date/Time   CHOL 175 07/02/2015 0602   TRIG 40 07/02/2015 0602   HDL 42 07/02/2015 0602   CHOLHDL 4.2 07/02/2015 0602   VLDL 8 07/02/2015 0602   LDLCALC 125* 07/02/2015 0602   HgbA1C  Lab Results  Component Value Date   HGBA1C 6.0* 07/02/2015   Cardiac Panel (last  3 results) No results for input(s): CKTOTAL, CKMB, TROPONINI, RELINDX in the last 72 hours. Urinalysis    Component Value Date/Time   COLORURINE YELLOW 07/02/2015 0055   APPEARANCEUR CLEAR 07/02/2015 0055   LABSPEC 1.029 07/02/2015 0055   PHURINE 6.0 07/02/2015 0055   GLUCOSEU 250* 07/02/2015 0055   HGBUR NEGATIVE 07/02/2015 0055   BILIRUBINUR NEGATIVE 07/02/2015 0055   KETONESUR 15* 07/02/2015 0055   PROTEINUR NEGATIVE 07/02/2015 0055   UROBILINOGEN 0.2 11/25/2014 1223   NITRITE NEGATIVE 07/02/2015 0055   LEUKOCYTESUR NEGATIVE 07/02/2015 0055   Urine Drug Screen     Component Value Date/Time   LABOPIA NONE DETECTED 07/02/2015 0055   COCAINSCRNUR POSITIVE* 07/02/2015 0055   LABBENZ NONE DETECTED 07/02/2015 0055   AMPHETMU NONE DETECTED 07/02/2015 0055   THCU NONE DETECTED 07/02/2015 0055   LABBARB NONE DETECTED 07/02/2015 0055    Alcohol Level    Component Value Date/Time   ETH <5 07/01/2015 1820     SIGNIFICANT DIAGNOSTIC STUDIES   Ct Head Wo Contrast 07/01/2015 No acute  intracranial hemorrhage.  07/01/2015 1. No acute intracranial findings. 2. Minimal chronic right maxillary sinusitis.   Ct Angio Head & Neck W/cm &/or Wo/cm 07/01/2015 Negative CTA head and neck. No atherosclerotic disease dissection or occlusion. Enlargement of the right sternocleidomastoid muscle in the lower neck. This may be due to muscle injury from fall.   Ct Cervical Spine Wo Contrast 07/01/2015 No acute intracranial hemorrhage.   Transthoracic echocardiogram - Left ventricle: The cavity size was normal. Systolic function was normal. The estimated ejection fraction was in the range of 55% to 60%. Wall motion was normal; there were no regional wall motion abnormalities. Doppler parameters are consistent with abnormal left ventricular relaxation (grade 1 diastolic dysfunction). Doppler parameters are consistent with indeterminate ventricular filling pressure. - Aortic valve: Transvalvular velocity was within the normal range. There was no stenosis. There was moderate regurgitation. Regurgitation pressure half-time: 332 ms. - Mitral valve: Transvalvular velocity was within the normal range. There was no evidence for stenosis. There was no regurgitation. - Right ventricle: The cavity size was normal. Wall thickness was normal. Systolic function was normal. - Atrial septum: No defect or patent foramen ovale was identified. - Tricuspid valve: There was no regurgitation. - Inferior vena cava: The vessel was normal in size. The respirophasic diameter changes were in the normal range (>= 50%).  MRI  07/02/2015 1. Stable mild periventricular white matter changes, likely reflecting the sequela of microvascular ischemia. 2. No acute intracranial abnormality or significant interval change.     HISTORY OF PRESENT ILLNESS Derek French is a 50 y.o. male with a history of previous stroke(by discharge summary, negative MRI brain) who presents with sudden onset right sided weakness. He has been using  cocaine recently. He was seen walking normally to the bathroom at 4:40pm. Family heard a crash and on entering found him on the ground. He was aphasic on EMS arrival, but began talking en route. Continued to have severe right sided weakness.   History is limite as he does not speak much, but he is able to communicate with nods/shakes appropriately and appears to understand.   LKW: 4:40 pm  tpa given?: yes   HOSPITAL COURSE [name] is a 50 y.o. male with history of reported stroke (hospital records are negative) and polysubstance abuse presenting following a fall with aphasia which resolved -? R HP and R field cut. He received IV t-PA 07/01/2015 at 1819.  Right hemiparesis  S/p IV tPA - cocaine induced vasospasm vs. Hemiplegic migraine vs. Conversion disorder. Since all strokes so far related to cocaine use, stroke symptoms likely related to cocaine. It is also possible that cocaine use triggers HA leading to hemiplegic migraine. Pt needs to quit cocaine. However, pt does have functional component on exam. So conversion disorder also in the DDx  Resultant R HP and R field cut, all improving with functional component. Mild headache  Initial CT normal  CTA head and neck Negative for large vessel disease  MRI No acute stroke  2D Echo EF 55-60%  LDL 125  HgbA1c 6.0  SCDs for VTE prophylaxis  Diet Heart Room service appropriate?: Yes; Fluid consistency:: Thin  aspirin 81 mg daily prior to admission, now on ASA 325mg .   Ongoing aggressive stroke risk factor management  CT neck unremarkable. C collar removed without pain on neck movement. C collar discontinued.  Therapy recommendations: HH therapy, however, insurance not cover  Disposition:The patient is being discharged to home with supervision from mother.  Hx stroke/TIA  06/2015 cerebral ischemia with right HP, s/p tPA, cocaine positive. MRI negative  09/2011 Cerebral ischemia with R HP d/t cocaine use s/p IV tPA.  MRI neg - cocaine positive, marijuana positive  03/2011 Cerebral ischemic with R HP and R field cut secondary to vasospasm from cocaine use. MRI neg. cocaine positive and marijuana positive  07/2009 [facility] for stroke at [facility] MRI neg. Cocaine positiv  Cocaine use  UDS positive again  Cessation counseling provided  Pt is willing to quit  Hyperlipidemia  Home meds: No statin  LDL 125, goal < 70  lipitor 20mg  added   Continue statin at discharge  Tobacco abuse  Current smoker  Smoking cessation counseling provided  Pt is willing to quit  Other Stroke Risk Factors  ETOH use  Hx of THC use, UDS negative this admission  Family hx stroke (father)   DISCHARGE EXAM Blood pressure 120/78, pulse 77, temperature 98.4 F (36.9 C), temperature source Oral, resp. rate 16, height 6\' 2"  (1.88 m), weight 82.373 kg (181 lb 9.6 oz), SpO2 97 %. PHYSICAL EXAM Temp: [97.7 F (36.5 C)-98.3 F (36.8 C)] 98.3 F (36.8 C) (05/11 1029) Pulse Rate: [58-80] 60 (05/11 1029) Resp: [16-18] 18 (05/11 1029) BP: (119-137)/(72-99) 128/72 mmHg (05/11 1029) SpO2: [93 %-98 %] 98 % (05/11 1029)  General - Well nourished, well developed, in no apparent distress.  Ophthalmologic - Fundi not visualized due to small pupils.  Cardiovascular - Regular rate and rhythm.  Mental Status -  Level of arousal and orientation to time, place, and person were intact. Language including expression, naming, repetition, comprehension was assessed and found intact. Fund of Knowledge was assessed and was intact.  Cranial Nerves II - XII - II - visual field intact III, IV, VI - Extraocular movements intact. V - Facial sensation decreased on the right. However, midline split with tuning fork exam. VII - Facial movement intact bilaterally. VIII - Hearing & vestibular intact bilaterally. X - Palate elevates symmetrically. XI - Chin turning & shoulder shrug intact bilaterally. XII - Tongue protrusion  intact.  Motor Strength - The patient's strength was RUE 4/5 proximal and 4+/5 distal, RLE 4/5 proximal and distal and RUE drift. However, the drift is without pronator drift. Hoover sign test inconsistent. LUE and LLE 5/5. Bulk was normal and fasciculations were absent.  Motor Tone - Muscle tone was assessed at the neck and appendages and was normal.  Reflexes - The patient's  reflexes were 1+ in all extremities and he had no pathological reflexes.  Sensory - Light touch, temperature/pinprick were assessed and were decreased on the right.   Coordination - The patient had normal movements in the right hand with no ataxia or dysmetria. Tremor was absent.  Gait and Station - deferred to PT.   Following rehabilitation consultation the patient was not felt to be a candidate for inpatient rehabilitation. He did not have medical insurance to cover home health physical therapy as recommended. The patient is being discharged to home in the care of his mother.  Discharge Diet   Diet Heart Room service appropriate?: Yes; Fluid consistency:: Thin liquids    Patient discharge instructions 1. Find a primary care physician and schedule an appointment. 2. Gradually increase activity as tolerated 3. Stop using alcohol, tobacco, or any illegal substances.    DISCHARGE PLAN  Disposition:  Discharged to home in the care of his mother.  aspirin 325 mg daily for secondary stroke prevention.  Follow-up No PCP Per Patient in 2 weeks.  Follow-up with Dr. [facility] in 2 months, office to schedule an appointment.  30 minutes were spent preparing discharge.  Delton See PA-C [facility] Pager [phone] 07/04/2015, 1:31 PM  I, the attending vascular neurologist, have personally obtained a history, examined the patient, evaluated laboratory data, individually viewed imaging studies and agree with radiology interpretations. Together with the NP/PA, we formulated  the assessment and plan of care which reflects our mutual decision.  I have made any additions or clarifications directly to the above note and agree with the findings and plan as currently documented.    Derek Plan, MD PhD Stroke Neurology 07/04/2015 7:48 PM

## 2015-07-04 NOTE — Progress Notes (Signed)
Writer called patient's mom to let her know that patient has been d/c home. Let a voice message to call writer back.

## 2015-07-04 NOTE — Progress Notes (Signed)
Occupational Therapy Treatment Patient Details Name: Derek French MRN: [id number] DOB: 06/17/1965 Today's Date: 07/04/2015    History of present illness Patient is a 50 y/o male with hx of HA, reported stroke (hospital records are negative) and polysubstance abuse presenting following a fall with aphasia which resolved, and right sided weakness. s/p IV t-PA 07/01/2015 at 1819. CT and MRI- unremarkable.    OT comments  Pt now with functional use of R UE as lead during ADL. Educated in home exercise program with level 3 theraband and med soft theraputty. Instructed pt to use RW and shower seat for safety at home. Pt verbalizing understanding.  Follow Up Recommendations   (per CM, pt does not qualify for post acute therapy)    Equipment Recommendations  None recommended by OT    Recommendations for Other Services      Precautions / Restrictions Precautions Precautions: Fall Precaution Comments: right sided weakness Restrictions Weight Bearing Restrictions: No       Mobility Bed Mobility Overal bed mobility: Modified Independent Bed Mobility: Supine to Sit     Supine to sit: Modified independent (Device/Increase time)     General bed mobility comments: increased time and min use of bedrail  Transfers Overall transfer level: Needs assistance Equipment used: Rolling walker (2 wheeled) Transfers: Sit to/from Stand Sit to Stand: Supervision         General transfer comment: did not provide physical assist    Balance     Sitting balance-Leahy Scale: Normal       Standing balance-Leahy Scale: Good                     ADL Overall ADL's : Needs assistance/impaired Eating/Feeding: Independent;Bed level Eating/Feeding Details (indicate cue type and reason): opening containers, leading with R hand Grooming: Supervision/safety;Standing           Upper Body Dressing : Set up;Sitting   Lower Body Dressing: Supervision/safety;Sit to/from stand   Toilet  Transfer: Supervision/safety;RW;Ambulation   Toileting- ArchitectClothing Manipulation and Hygiene: Supervision/safety;Sit to/from stand         General ADL Comments: Issued level 3 theraband and medium soft theraputty and instructed in use.      Vision                     Perception     Praxis      Cognition   Behavior During Therapy: WFL for tasks assessed/performed Overall Cognitive Status: Within Functional Limits for tasks assessed                       Extremity/Trunk Assessment               Exercises     Shoulder Instructions       General Comments      Pertinent Vitals/ Pain       Pain Assessment: No/denies pain  Home Living                                          Prior Functioning/Environment              Frequency       Progress Toward Goals  OT Goals(current goals can now be found in the care plan section)  Progress towards OT goals: Progressing toward goals  Acute Rehab OT Goals Patient Stated Goal:  go home Time For Goal Achievement: 07/10/15 Potential to Achieve Goals: Good  Plan Discharge plan needs to be updated    Co-evaluation                 End of Session Equipment Utilized During Treatment: Rolling walker;Gait belt   Activity Tolerance Patient tolerated treatment well   Patient Left in bed;with call bell/phone within reach;with bed alarm set   Nurse Communication          Time: 9604-5409 OT Time Calculation (min): 23 min  Charges: OT General Charges $OT Visit: 1 Procedure OT Treatments $Self Care/Home Management : 8-22 mins $Therapeutic Exercise: 8-22 mins  Evern Bio 07/04/2015, 1:40 PM  [phone]

## 2015-07-10 ENCOUNTER — Other Ambulatory Visit: Payer: Self-pay

## 2015-07-10 NOTE — Patient Outreach (Signed)
[facility] Care Management  07/10/2015  Derek PallRobert M French 01/26/1966 [id number]  Referral Date:  07/09/15 Source:  [facility] Stroke Program Issue:  Red Alert 07/08/15 - Have a way to get to follow-up appt?  No Patient is NOT [facility] Insurance:  Generic Workers Comp, [employer], Medicaid Potential  Outreach call #1 to patient for [facility] Stroke Red Alert follow-up.  Patient not reached.  Contact states patient's son-in-law picked patient up for a while to get him out of the house.   RN CM left HIPAA compliant message and advised will try patient back another time within one week.  [name], RN, BSN, [facility], CCM  [facility] Care Management Care Management Coordinator [phone] Direct [phone] Cell [phone] Office [phone] Fax

## 2015-07-11 ENCOUNTER — Other Ambulatory Visit: Payer: Self-pay

## 2015-07-11 NOTE — Patient Outreach (Signed)
[facility] Care Management  07/11/2015  Derek PallRobert M French 01/27/1966 [id number]  Referral Date: 07/09/15 Source: [facility] Stroke Program Issue: Red Alert 07/08/15 - Have a way to get to follow-up appt? No [facility] is NOT [facility] Insurance: Generic Workers Comp, [employer], Medicaid Potential  Outreach call #2 to patient for [facility] Stroke Red Alert follow-up.  Patient not reached.  RN CM left HIPAA compliant voice message with name and number for call back.  RN CM scheduled for next contact call within one week.   Donato Schultzrystal Tichina Koebel, RN, BSN, [facility], CCM  [facility] Care Management Care Management Coordinator [phone] Direct [phone] Cell [phone] Office [phone] Fax

## 2015-07-15 ENCOUNTER — Other Ambulatory Visit: Payer: Self-pay

## 2015-07-15 NOTE — Patient Outreach (Addendum)
[facility]) Care Management  07/15/2015  Derek PallRobert M Coachman 01/24/1966 [id number]   Referral Date: 07/09/15 Source: [facility] Stroke Program Issue: Red Alert 07/08/15 - Have a way to get to follow-up appt? No Patient is NOT eligible for [facility] services Insurance: Generic Workers Comp, [employer], [place] - applied 07/14/15  Outreach call #3 to patient for [facility] Stroke Red Alert follow-up. Patient reached.   Date of Admission: 07/01/2015 - 07/04/2015 Patient states he has resolved transportation issue and his mother will provide transportation to appt with Dr. Pearlean BrownieSethi on Thursday, 07/17/15.   States he is not having any issues since discharge.  States his BP is controlled with medication.  States no issues getting meds and they are affordable.   Weight:  181  - Scales in the home: yes Patient confirms he has quit smoking since stroke.  RN CM verified that patient does NOT have a PCP.  Patient states due to limited insurance, [facility]  States his mother has placed a call to get appt with the same MD that she sees.    Plan: Referral Date:  07/09/15 Patient is NOT eligible for [facility] services [facility] Consult:  [facility] HF Program 07/15/2015 Case Closed 07/15/2015  RN CM provided education on the importance of having Primary MD and encouraged to get appt scheduled.   RN CM verified no further transportation issues.  RN CM encouraged take meds as directed; weigh daily and check BP readings.  Report any changes in condition to MD.  RN CM reviewed symptoms of stroke and discussed importance of preventing secondary stroke.  RN CM confirmed patient has read Stroke Booklet provided at discharge.  RN CM provided contact name and # [phone] or main office # [phone] and 24-hour nurse line # [phone].  RN CM confirmed patient is aware of 911 services for urgent emergency needs.  Donato Schultzrystal Lady Wisham,  RN, BSN, [facility], CCM  [facility] Care Management Care Management Coordinator [phone] Direct [phone] Cell [phone] Office [phone] Fax

## 2015-09-17 ENCOUNTER — Ambulatory Visit: Payer: Self-pay | Admitting: Neurology

## 2015-09-18 ENCOUNTER — Encounter: Payer: Self-pay | Admitting: Neurology

## 2015-10-15 ENCOUNTER — Other Ambulatory Visit: Payer: Self-pay

## 2017-01-10 ENCOUNTER — Emergency Department (HOSPITAL_COMMUNITY): Payer: Self-pay

## 2017-01-10 ENCOUNTER — Other Ambulatory Visit: Payer: Self-pay

## 2017-01-10 ENCOUNTER — Encounter (HOSPITAL_COMMUNITY): Payer: Self-pay | Admitting: *Deleted

## 2017-01-10 ENCOUNTER — Emergency Department (HOSPITAL_COMMUNITY)
Admission: EM | Admit: 2017-01-10 | Discharge: 2017-01-10 | Disposition: A | Discharge status: Home / Self Care | Payer: Self-pay | Attending: Emergency Medicine | Admitting: Emergency Medicine

## 2017-01-10 DIAGNOSIS — F17210 Nicotine dependence, cigarettes, uncomplicated: Secondary | ICD-10-CM | POA: Insufficient documentation

## 2017-01-10 DIAGNOSIS — R52 Pain, unspecified: Secondary | ICD-10-CM

## 2017-01-10 DIAGNOSIS — Z7982 Long term (current) use of aspirin: Secondary | ICD-10-CM | POA: Insufficient documentation

## 2017-01-10 DIAGNOSIS — R0789 Other chest pain: Secondary | ICD-10-CM | POA: Insufficient documentation

## 2017-01-10 DIAGNOSIS — Z8673 Personal history of transient ischemic attack (TIA), and cerebral infarction without residual deficits: Secondary | ICD-10-CM | POA: Insufficient documentation

## 2017-01-10 DIAGNOSIS — Z79899 Other long term (current) drug therapy: Secondary | ICD-10-CM | POA: Insufficient documentation

## 2017-01-10 DIAGNOSIS — L739 Follicular disorder, unspecified: Secondary | ICD-10-CM | POA: Insufficient documentation

## 2017-01-10 LAB — HEPATIC FUNCTION PANEL
ALT: 16 U/L — AB (ref 17–63)
AST: 17 U/L (ref 15–41)
Albumin: 3.5 g/dL (ref 3.5–5.0)
Alkaline Phosphatase: 58 U/L (ref 38–126)
BILIRUBIN DIRECT: 0.2 mg/dL (ref 0.1–0.5)
BILIRUBIN INDIRECT: 0.5 mg/dL (ref 0.3–0.9)
Total Bilirubin: 0.7 mg/dL (ref 0.3–1.2)
Total Protein: 6.4 g/dL — ABNORMAL LOW (ref 6.5–8.1)

## 2017-01-10 LAB — URINALYSIS, ROUTINE W REFLEX MICROSCOPIC
Bacteria, UA: NONE SEEN
Bilirubin Urine: NEGATIVE
GLUCOSE, UA: NEGATIVE mg/dL
Hgb urine dipstick: NEGATIVE
Ketones, ur: NEGATIVE mg/dL
LEUKOCYTES UA: NEGATIVE
Nitrite: NEGATIVE
PROTEIN: 30 mg/dL — AB
Specific Gravity, Urine: 1.034 — ABNORMAL HIGH (ref 1.005–1.030)
pH: 5 (ref 5.0–8.0)

## 2017-01-10 LAB — CBC WITH DIFFERENTIAL/PLATELET
BASOS PCT: 1 %
Basophils Absolute: 0 10*3/uL (ref 0.0–0.1)
Eosinophils Absolute: 0.2 10*3/uL (ref 0.0–0.7)
Eosinophils Relative: 4 %
HEMATOCRIT: 44.2 % (ref 39.0–52.0)
HEMOGLOBIN: 15.4 g/dL (ref 13.0–17.0)
LYMPHS ABS: 2.9 10*3/uL (ref 0.7–4.0)
Lymphocytes Relative: 48 %
MCH: 32 pg (ref 26.0–34.0)
MCHC: 34.8 g/dL (ref 30.0–36.0)
MCV: 91.9 fL (ref 78.0–100.0)
MONO ABS: 0.7 10*3/uL (ref 0.1–1.0)
MONOS PCT: 12 %
NEUTROS ABS: 2.1 10*3/uL (ref 1.7–7.7)
NEUTROS PCT: 35 %
Platelets: 279 10*3/uL (ref 150–400)
RBC: 4.81 MIL/uL (ref 4.22–5.81)
RDW: 14 % (ref 11.5–15.5)
WBC: 6 10*3/uL (ref 4.0–10.5)

## 2017-01-10 LAB — I-STAT CHEM 8, ED
BUN: 11 mg/dL (ref 6–20)
CALCIUM ION: 1.06 mmol/L — AB (ref 1.15–1.40)
Chloride: 105 mmol/L (ref 101–111)
Creatinine, Ser: 1 mg/dL (ref 0.61–1.24)
GLUCOSE: 83 mg/dL (ref 65–99)
HCT: 46 % (ref 39.0–52.0)
HEMOGLOBIN: 15.6 g/dL (ref 13.0–17.0)
POTASSIUM: 3.4 mmol/L — AB (ref 3.5–5.1)
SODIUM: 140 mmol/L (ref 135–145)
TCO2: 23 mmol/L (ref 22–32)

## 2017-01-10 LAB — I-STAT TROPONIN, ED: TROPONIN I, POC: 0 ng/mL (ref 0.00–0.08)

## 2017-01-10 MED ORDER — IOPAMIDOL (ISOVUE-370) INJECTION 76%
INTRAVENOUS | Status: AC
  Administered 2017-01-10: 100 mL
  Filled 2017-01-10: qty 100

## 2017-01-10 MED ORDER — OXYCODONE-ACETAMINOPHEN 5-325 MG PO TABS
1.0000 | ORAL_TABLET | Freq: Once | ORAL | Status: AC
  Administered 2017-01-10: 1 via ORAL
  Filled 2017-01-10: qty 1

## 2017-01-10 MED ORDER — AMOXICILLIN-POT CLAVULANATE 875-125 MG PO TABS: 1.0000 | ORAL_TABLET | Freq: Two times a day (BID) | ORAL | 0 refills | Status: DC

## 2017-01-10 MED ORDER — HYDROCODONE-ACETAMINOPHEN 5-325 MG PO TABS: 1.0000 | ORAL_TABLET | ORAL | 0 refills | Status: DC | PRN

## 2017-01-10 MED ORDER — SODIUM CHLORIDE 0.9 % IV SOLN
INTRAVENOUS | Status: DC
  Administered 2017-01-10: 17:00:00 via INTRAVENOUS

## 2017-01-10 NOTE — ED Triage Notes (Signed)
C/o chest pressure for several days with cough non-productive, states he thought it was gas worse with deep breath or movement.

## 2017-01-10 NOTE — Discharge Instructions (Signed)
There were no serious findings associated with the chest discomfort which you are having.  There are no current signs of heart attack or concerned that he will have one.  If your discomfort worsens, or becomes associated with weakness, dizziness, sweating or vomiting, you should return for reevaluation.  In the meantime treating her pain with medication such as ibuprofen or Tylenol, and using heat on the sore area 2 or 3 times a day should help.  To treat the rash on your buttocks and upper legs, soak in a warm tub twice a day and clean the skin well with an antibacterial soap.  Make sure that you change her underwear frequently, and try to keep dry.  He will probably have to see a primary care doctor for further evaluation and treatment of this problem and any other conditions that arise.  You can use the attached resource guide, to help you find a doctor.  We are prescribing narcotic pain medicine to take for the pain.  Do not drive or operate machinery, or work when taking this pain medicine.

## 2017-01-10 NOTE — ED Provider Notes (Signed)
[facility] EMERGENCY DEPARTMENT Provider Note   CSN: [id number] Arrival date & time: 01/10/17  4098     History   Chief Complaint Chief Complaint  Patient presents with  . Chest Pain  . Cough    HPI Derek French is a 51 y.o. male.  He complains of left chest pain, which started after multiple episodes of vomiting 2 days ago.  The vomiting has stopped.  He has not had any diarrhea.  He denies fever, chills, cough, weakness or dizziness.  He denies known trauma other than the vomiting.  He does recall vomiting and retching for a prolonged period, 2 days ago.  No prior similar problems.  There are no other known modifying factors.  HPI  Past Medical History:  Diagnosis Date  . Cocaine use   . Headache(784.0)   . Recurrent boils   . Stroke-like episode (HCC)    07/2009, 03/2011, 09/2011    Patient Active Problem List   Diagnosis Date Noted  . Stroke (cerebrum) (HCC) 07/01/2015  . Cerebral ischemia 09/26/2011  . Hemiplegia, unspecified, affecting nondominant side 09/26/2011  . Tinnitus 09/26/2011  . CVA (cerebral infarction) 03/30/2011  . Dyslipidemia 03/30/2011  . Leukocytosis 03/30/2011  . Substance abuse (HCC) 03/30/2011  . Cigarette smoker 03/30/2011    Past Surgical History:  Procedure Laterality Date  . HERNIA REPAIR         Home Medications    Prior to Admission medications   Medication Sig Start Date End Date Taking? Authorizing Provider  aspirin 325 MG tablet Take 1 tablet (325 mg total) by mouth daily. 07/04/15  Yes Rinehuls, Kinnie Scales, PA-C  amoxicillin-clavulanate (AUGMENTIN) 875-125 MG tablet Take 1 tablet 2 (two) times daily by mouth. One po bid x 7 days 01/10/17   Mancel Bale, MD  atorvastatin (LIPITOR) 20 MG tablet Take 1 tablet (20 mg total) by mouth daily at 6 PM. Patient not taking: Reported on 01/10/2017 07/04/15   Rinehuls, Kinnie Scales, PA-C  HYDROcodone-acetaminophen [facility]) 5-325 MG tablet Take 1 tablet every 4 (four) hours  as needed by mouth. 01/10/17   Mancel Bale, MD    Family History Family History  Problem Relation Age of Onset  . Stroke Father     Social History Social History   Tobacco Use  . Smoking status: Current Every Day Smoker    Packs/day: 1.00    Types: Cigarettes  . Smokeless tobacco: Never Used  Substance Use Topics  . Alcohol use: Yes    Comment: occ  . Drug use: Yes    Types: Marijuana, Cocaine    Comment: relapsed 1 week ago     Allergies   Bee venom and Codeine   Review of Systems Review of Systems  All other systems reviewed and are negative.    Physical Exam Updated Vital Signs BP (!) 143/83 (BP Location: Left Arm)   Pulse (!) 53   Temp 98.8 F (37.1 C) (Oral)   Resp 20   Ht 6\' 2"  (1.88 m)   Wt 83.9 kg (185 lb)   SpO2 98%   BMI 23.75 kg/m   Physical Exam  Constitutional: He is oriented to person, place, and time. He appears well-developed and well-nourished. He appears ill (He is uncomfortable).  HENT:  Head: Normocephalic and atraumatic.  Right Ear: External ear normal.  Left Ear: External ear normal.  Eyes: Conjunctivae and EOM are normal. Pupils are equal, round, and reactive to light.  Neck: Normal range of motion and  phonation normal. Neck supple.  Cardiovascular: Normal rate, regular rhythm and normal heart sounds.  Pulmonary/Chest: Effort normal and breath sounds normal. He exhibits no bony tenderness.  Moderate diffuse left chest wall tenderness without crepitation or deformity.  He resists lying supine on the stretcher secondary to left chest wall pain.  Abdominal: Soft. There is no tenderness.  Musculoskeletal: Normal range of motion.  Neurological: He is alert and oriented to person, place, and time. No cranial nerve deficit or sensory deficit. He exhibits normal muscle tone. Coordination normal.  Skin: Skin is warm, dry and intact.  Psychiatric: He has a normal mood and affect. His behavior is normal. Judgment and thought content  normal.  Nursing note and vitals reviewed.    ED Treatments / Results  Labs (all labs ordered are listed, but only abnormal results are displayed) Labs Reviewed  URINALYSIS, ROUTINE W REFLEX MICROSCOPIC - Abnormal; Notable for the following components:      Result Value   Specific Gravity, Urine 1.034 (*)    Protein, ur 30 (*)    Squamous Epithelial / LPF 0-5 (*)    All other components within normal limits  HEPATIC FUNCTION PANEL - Abnormal; Notable for the following components:   Total Protein 6.4 (*)    ALT 16 (*)    All other components within normal limits  I-STAT CHEM 8, ED - Abnormal; Notable for the following components:   Potassium 3.4 (*)    Calcium, Ion 1.06 (*)    All other components within normal limits  CBC WITH DIFFERENTIAL/PLATELET  I-STAT TROPONIN, ED    EKG  EKG Interpretation  Date/Time:  Monday January 10 2017 09:37:43 EST Ventricular Rate:  64 PR Interval:  162 QRS Duration: 86 QT Interval:  404 QTC Calculation: 416 R Axis:   69 Text Interpretation:  Normal sinus rhythm Left ventricular hypertrophy with repolarization abnormality Abnormal ECG since last tracing no significant change Confirmed by Mancel Bale [phone]) on 01/10/2017 2:57:43 PM       Radiology Dg Chest 2 View  Result Date: 01/10/2017 CLINICAL DATA:  Two days of shortness of breath. History of previous CVA, current smoker EXAM: CHEST  2 VIEW COMPARISON:  Portable chest x-ray of September 27, 2011 FINDINGS: The lungs are adequately inflated. The interstitial markings are mildly increased over baseline. Subsegmental atelectasis at the lung bases is suspected. There is no pleural effusion. The heart and pulmonary vascularity are normal. The observed bony thorax is unremarkable. IMPRESSION: Mild interstitial prominence bilaterally suggests acute bronchitis. This is superimposed upon mild chronic interstitial change attributable to the smoking. Probable bibasilar subsegmental atelectasis.  Electronically Signed   By: David  [place] M.D.   On: 01/10/2017 09:20   Dg Ribs Unilateral Left  Result Date: 01/10/2017 CLINICAL DATA:  51 y/o M; chest pressure and left-sided rib pain for several days. EXAM: LEFT RIBS - 2 VIEW COMPARISON:  None. FINDINGS: No fracture or other bone lesions are seen involving the ribs. IMPRESSION: Negative. Electronically Signed   By: Mitzi Hansen M.D.   On: 01/10/2017 15:44   Ct Angio Chest/abd/pel For Dissection W And/or Wo Contrast  Result Date: 01/10/2017 CLINICAL DATA:  Chest pressure for several days, nonproductive cough, smoker EXAM: CT ANGIOGRAPHY CHEST, ABDOMEN AND PELVIS TECHNIQUE: Multidetector CT imaging through the chest, abdomen and pelvis was performed using the standard protocol during bolus administration of intravenous contrast. Multiplanar reconstructed images and MIPs were obtained and reviewed to evaluate the vascular anatomy. CONTRAST:  ISOVUE-370 IOPAMIDOL (ISOVUE-370) INJECTION  76% IV COMPARISON:  CT abdomen and pelvis 11/18/2014 FINDINGS: CTA CHEST FINDINGS Cardiovascular: Precontrast imaging demonstrates normal caliber of thoracic aorta without intramural hematoma. No peri-aortic infiltration. Excellent enhancement of thoracic vascular structures. Aorta normal caliber without aneurysm or dissection. Pulmonary arteries patent without evidence of pulmonary embolism. No pericardial effusion. Mediastinum/Nodes: No thoracic adenopathy. Esophagus unremarkable. Base of cervical region normal appearance. Suspected minimal residual thymic tissue in anterior mediastinum. Lungs/Pleura: Linear subsegmental atelectasis in BILATERAL lower lobes. Emphysematous changes at lung apices. No acute infiltrate, pleural effusion or pneumothorax. Musculoskeletal: Unremarkable Review of the MIP images confirms the above findings. CTA ABDOMEN AND PELVIS FINDINGS VASCULAR Aorta: Normal caliber without aneurysm or dissection. Minimal atherosclerotic plaque  distal aorta. Celiac: Patent, normal appearance SMA: Patent , normal appearance Renals: Patent , normal appearance. Single renal arteries bilaterally. IMA: Patent, normal appearance Inflow: Minimal atherosclerotic plaque in the iliac arteries. Patent without focal narrowing. Veins: Un opacified at time of CT imaging, unable to assess patency Review of the MIP images confirms the above findings. NON-VASCULAR Hepatobiliary: Gallbladder and liver normal appearance Pancreas: Normal appearance Spleen: Normal appearance Adrenals/Urinary Tract: Adrenal glands appear mildly thickened bilaterally without discrete mass. Kidneys, ureters and bladder normal appearance Stomach/Bowel: Normal appendix. Stomach and bowel loops normal appearance for technique Lymphatic: No adenopathy Reproductive: Minimal prostatic enlargement. Other: No free air or free fluid.  No hernia. Musculoskeletal: Unremarkable Review of the MIP images confirms the above findings. IMPRESSION: No vascular abnormalities identified. Subsegmental atelectasis BILATERAL lower lobes with mild emphysematous changes at lung apices. Electronically Signed   By: Ulyses SouthwardMark  Boles M.D.   On: 01/10/2017 18:45    Procedures Procedures (including critical care time)  Medications Ordered in ED Medications  0.9 %  sodium chloride infusion ( Intravenous New Bag/Given 01/10/17 1720)  oxyCODONE-acetaminophen (PERCOCET/ROXICET) 5-325 MG per tablet 1 tablet (1 tablet Oral Given 01/10/17 1512)  iopamidol (ISOVUE-370) 76 % injection (100 mLs  Contrast Given 01/10/17 1802)     Initial Impression / Assessment and Plan / ED Course  I have reviewed the triage vital signs and the nursing notes.  Pertinent labs & imaging results that were available during my care of the patient were reviewed by me and considered in my medical decision making (see chart for details).  Clinical Course as of Jan 11 1915  Mon Jan 10, 2017  1608 Patient states he has had minimal relief of his  discomfort after Percocet.  He now admits to using cocaine several days ago, and the ER for this procedure at the onset of his pain.  He additionally noticed some transient cough at this time.  Additional testing ordered labs and CT imaging.  Concern has been raised for aortic dissection which will require CT imaging.  [EW]  1910 Patient remains comfortable.  He has new complaint at this time of bumps on his perineum.  These were evaluated and he has several areas of chronic appearing follicular inflammation.  There are no areas of pustules, or drainage.  He feels like they are getting worse.  We discussed treatment including warm soaks, and he prefers to take an antibiotic.  [EW]    Clinical Course User Index [EW] Mancel BaleWentz, Arnita Koons, MD     Patient Vitals for the past 24 hrs:  BP Temp Temp src Pulse Resp SpO2 Height Weight  01/10/17 1911 (!) 143/83 98.8 F (37.1 C) Oral (!) 53 - 98 % - -  01/10/17 1615 (!) 146/85 - - (!) 53 - 94 % - -  01/10/17 1530 (!) 135/91 - - (!) 56 - 96 % - -  01/10/17 1515 138/81 - - (!) 56 - 96 % - -  01/10/17 1450 (!) 140/106 97.6 F (36.4 C) Oral 60 20 97 % - -  01/10/17 1205 139/83 98 F (36.7 C) Oral (!) 54 20 99 % - -  01/10/17 0904 - - - - - - 6\' 2"  (1.88 m) 83.9 kg (185 lb)  01/10/17 0857 (!) 191/106 97.6 F (36.4 C) Oral 78 (!) 22 99 % - -    7:11 PM Reevaluation with update and discussion. After initial assessment and treatment, an updated evaluation reveals no change in clinical status.  Findings discussed with the patient and all questions were answered. Mancel BaleElliott Elenore Wanninger      Final Clinical Impressions(s) / ED Diagnoses   Final diagnoses:  Pain  Chest wall pain  Folliculitis    Nonspecific chest pain, doubt ACS, PE, pneumonia, aortic dissection, serious bacterial infection or metabolic instability.  Incidental folliculitis, chronic, but possibly worse and he will be treated with antibiotics, and instructed on care of folliculitis.  Nursing Notes  Reviewed/ Care Coordinated Applicable Imaging Reviewed Interpretation of Laboratory Data incorporated into ED treatment  The patient appears reasonably screened and/or stabilized for discharge and I doubt any other medical condition or other [facility] requiring further screening, evaluation, or treatment in the ED at this time prior to discharge.  Plan: Home Medications-OTC analgesia as needed; Home Treatments-rest, fluids, do not use cocaine; return here if the recommended treatment, does not improve the symptoms; Recommended follow up-PCP checkup as needed and in 1 month for ongoing management.   ED Discharge Orders        Ordered    HYDROcodone-acetaminophen (NORCO) 5-325 MG tablet  Every 4 hours PRN     01/10/17 1914    amoxicillin-clavulanate (AUGMENTIN) 875-125 MG tablet  2 times daily     01/10/17 1914       Mancel BaleWentz, Galya Dunnigan, MD 01/10/17 1919

## 2017-06-13 ENCOUNTER — Encounter (HOSPITAL_COMMUNITY): Payer: Self-pay | Admitting: Emergency Medicine

## 2017-06-13 ENCOUNTER — Emergency Department (HOSPITAL_COMMUNITY)
Admission: EM | Admit: 2017-06-13 | Discharge: 2017-06-13 | Disposition: A | Discharge status: Home / Self Care | Payer: Self-pay | Attending: Emergency Medicine | Admitting: Emergency Medicine

## 2017-06-13 ENCOUNTER — Other Ambulatory Visit: Payer: Self-pay

## 2017-06-13 DIAGNOSIS — Z7982 Long term (current) use of aspirin: Secondary | ICD-10-CM | POA: Insufficient documentation

## 2017-06-13 DIAGNOSIS — F17210 Nicotine dependence, cigarettes, uncomplicated: Secondary | ICD-10-CM | POA: Insufficient documentation

## 2017-06-13 DIAGNOSIS — L02412 Cutaneous abscess of left axilla: Secondary | ICD-10-CM | POA: Insufficient documentation

## 2017-06-13 MED ORDER — DOXYCYCLINE HYCLATE 100 MG PO CAPS: 100.0000 mg | ORAL_CAPSULE | Freq: Two times a day (BID) | ORAL | 0 refills | Status: AC

## 2017-06-13 MED ORDER — OXYCODONE-ACETAMINOPHEN 5-325 MG PO TABS
1.0000 | ORAL_TABLET | Freq: Once | ORAL | Status: AC
  Administered 2017-06-13: 1 via ORAL
  Filled 2017-06-13: qty 1

## 2017-06-13 MED ORDER — DOXYCYCLINE HYCLATE 100 MG PO TABS
100.0000 mg | ORAL_TABLET | Freq: Once | ORAL | Status: AC
  Administered 2017-06-13: 100 mg via ORAL
  Filled 2017-06-13: qty 1

## 2017-06-13 MED ORDER — LIDOCAINE-EPINEPHRINE (PF) 2 %-1:200000 IJ SOLN
10.0000 mL | Freq: Once | INTRAMUSCULAR | Status: AC
  Administered 2017-06-13: 10 mL
  Filled 2017-06-13: qty 20

## 2017-06-13 NOTE — ED Triage Notes (Signed)
Pt c/o abscess under the left arm x 1 week. Pt reports the abscess "burst" today, and had a foul odor and continues to drain.

## 2017-06-13 NOTE — ED Notes (Signed)
Pt states he has an abscess in his L underarm that opened and drained today while at work and has had worsening pain since this morning at 4 am. States he has had abscesses in the past which have either been drained or gone away with warm compresses.

## 2017-06-13 NOTE — ED Provider Notes (Signed)
[facility] EMERGENCY DEPARTMENT Provider Note   CSN: [id number] Arrival date & time: 06/13/17  1449     History   Chief Complaint Chief Complaint  Patient presents with  . Abscess    HPI Derek French is a 52 y.o. male with a past medical history of recurrent abscesses, who presents to ED for evaluation of abscess for the past 8 days.  He states that the abscess is located under his left forearm.  He reports history of similar symptoms in the past which usually resolve with supportive measures such as warm compresses.  He states that he felt worsening of his pain this morning and then spontaneous drainage earlier at work.  He took 1 dose of Advil with no improvement in his symptoms.  Denies any injury or trauma to the area, fevers, other symptoms.  HPI  Past Medical History:  Diagnosis Date  . Cocaine use   . Headache(784.0)   . Recurrent boils   . Stroke-like episode (HCC)    07/2009, 03/2011, 09/2011    Patient Active Problem List   Diagnosis Date Noted  . Stroke (cerebrum) (HCC) 07/01/2015  . Cerebral ischemia 09/26/2011  . Hemiplegia, unspecified, affecting nondominant side 09/26/2011  . Tinnitus 09/26/2011  . CVA (cerebral infarction) 03/30/2011  . Dyslipidemia 03/30/2011  . Leukocytosis 03/30/2011  . Substance abuse (HCC) 03/30/2011  . Cigarette smoker 03/30/2011    Past Surgical History:  Procedure Laterality Date  . HERNIA REPAIR          Home Medications    Prior to Admission medications   Medication Sig Start Date End Date Taking? Authorizing Provider  aspirin EC 81 MG tablet Take 81 mg by mouth daily.   Yes [provider]  amoxicillin-clavulanate (AUGMENTIN) 875-125 MG tablet Take 1 tablet 2 (two) times daily by mouth. One po bid x 7 days Patient not taking: Reported on 06/13/2017 01/10/17   Mancel Bale, MD  aspirin 325 MG tablet Take 1 tablet (325 mg total) by mouth daily. Patient not taking: Reported on 06/13/2017  07/04/15   Rinehuls, Kinnie Scales, PA-C  doxycycline (VIBRAMYCIN) 100 MG capsule Take 1 capsule (100 mg total) by mouth 2 (two) times daily for 7 days. 06/13/17 06/20/17  Kimarie Coor, Hillary Bow, PA-C  HYDROcodone-acetaminophen (NORCO) 5-325 MG tablet Take 1 tablet every 4 (four) hours as needed by mouth. Patient not taking: Reported on 06/13/2017 01/10/17   Mancel Bale, MD  belladonna-opium (B&O SUPPRETTES) 16.2-30 MG suppository Place 1 suppository rectally every 8 (eight) hours as needed for pain. Patient not taking: Reported on 06/03/2015 11/25/14 06/03/15  Renne Crigler, PA-C    Family History Family History  Problem Relation Age of Onset  . Stroke Father     Social History Social History   Tobacco Use  . Smoking status: Current Every Day Smoker    Packs/day: 1.00    Types: Cigarettes  . Smokeless tobacco: Never Used  Substance Use Topics  . Alcohol use: Yes    Comment: occ  . Drug use: Yes    Types: Marijuana, Cocaine    Comment: relapsed 1 week ago     Allergies   Bee venom and Codeine   Review of Systems Review of Systems  Constitutional: Negative for chills and fever.  Gastrointestinal: Negative for nausea and vomiting.  Musculoskeletal: Negative for myalgias, neck pain and neck stiffness.  Skin: Positive for wound. Negative for rash.     Physical Exam Updated Vital Signs BP (!) 148/99 (BP Location:  Right Arm)   Pulse 67   Temp 98.7 F (37.1 C) (Oral)   Resp 18   SpO2 99%   Physical Exam  Constitutional: He appears well-developed and well-nourished. No distress.  HENT:  Head: Normocephalic and atraumatic.  Eyes: Conjunctivae and EOM are normal. No scleral icterus.  Neck: Normal range of motion.  Pulmonary/Chest: Effort normal. No respiratory distress.  Neurological: He is alert.  Skin: No rash noted. He is not diaphoretic.  Approximately 3 cm area of induration surrounding fluctuance in the left axillary area.  No drainage noted at this time.  Tenderness to  palpation of the area.  No streaking noted.  Psychiatric: He has a normal mood and affect.  Nursing note and vitals reviewed.    ED Treatments / Results  Labs (all labs ordered are listed, but only abnormal results are displayed) Labs Reviewed - No data to display  EKG None  Radiology No results found.  Procedures .Marland KitchenIncision and Drainage Date/Time: 06/13/2017 5:52 PM Performed by: Dietrich Pates, PA-C Authorized by: Dietrich Pates, PA-C   Consent:    Consent obtained:  Verbal   Consent given by:  Patient   Risks discussed:  Bleeding, damage to other organs, incomplete drainage, infection and pain Location:    Type:  Abscess   Location:  Upper extremity   Upper extremity location:  Arm   Arm location:  L upper arm Pre-procedure details:    Skin preparation:  Betadine Anesthesia (see MAR for exact dosages):    Anesthesia method:  Local infiltration   Local anesthetic:  Lidocaine 2% WITH epi Procedure details:    Incision types:  Stab incision   Scalpel blade:  11   Wound management:  Probed and deloculated and irrigated with saline   Drainage:  Purulent and bloody   Drainage amount:  Scant   Wound treatment:  Wound left open   Packing materials:  None Post-procedure details:    Patient tolerance of procedure:  Tolerated well, no immediate complications   (including critical care time)  Medications Ordered in ED Medications  doxycycline (VIBRA-TABS) tablet 100 mg (has no administration in time range)  lidocaine-EPINEPHrine (XYLOCAINE W/EPI) 2 %-1:200000 (PF) injection 10 mL (10 mLs Infiltration Given 06/13/17 1729)  oxyCODONE-acetaminophen (PERCOCET/ROXICET) 5-325 MG per tablet 1 tablet (1 tablet Oral Given 06/13/17 1730)     Initial Impression / Assessment and Plan / ED Course  I have reviewed the triage vital signs and the nursing notes.  Pertinent labs & imaging results that were available during my care of the patient were reviewed by me and considered in my  medical decision making (see chart for details).     Patient presents to ED for abscess to L axilla for the past week. Reports history of similar symptoms in the past.  This abscess did spontaneously start draining this morning. However, fluctuance still noted on exam. Remainder of area was incised and drained.  Patient will be put on antibiotics due to his recurrent abscesses.  Advised to return for any severe or worsening symptoms.  Portions of this note were generated with Scientist, clinical (histocompatibility and immunogenetics). Dictation errors may occur despite best attempts at proofreading.   Final Clinical Impressions(s) / ED Diagnoses   Final diagnoses:  Abscess of left axilla    ED Discharge Orders        Ordered    doxycycline (VIBRAMYCIN) 100 MG capsule  2 times daily     06/13/17 1752       Ravynn Hogate, Blue,  PA-C 06/13/17 1759    Shaune PollackIsaacs, Cameron, MD 06/13/17 1943

## 2018-08-05 ENCOUNTER — Encounter (HOSPITAL_COMMUNITY): Payer: Self-pay | Admitting: Student

## 2018-08-05 ENCOUNTER — Emergency Department (HOSPITAL_COMMUNITY)
Admission: EM | Admit: 2018-08-05 | Discharge: 2018-08-05 | Disposition: A | Discharge status: Home / Self Care | Payer: Self-pay | Attending: Emergency Medicine | Admitting: Emergency Medicine

## 2018-08-05 ENCOUNTER — Other Ambulatory Visit: Payer: Self-pay

## 2018-08-05 DIAGNOSIS — R39198 Other difficulties with micturition: Secondary | ICD-10-CM | POA: Insufficient documentation

## 2018-08-05 DIAGNOSIS — F17210 Nicotine dependence, cigarettes, uncomplicated: Secondary | ICD-10-CM | POA: Insufficient documentation

## 2018-08-05 DIAGNOSIS — Z7982 Long term (current) use of aspirin: Secondary | ICD-10-CM | POA: Insufficient documentation

## 2018-08-05 LAB — URINALYSIS, ROUTINE W REFLEX MICROSCOPIC
Bilirubin Urine: NEGATIVE
Glucose, UA: NEGATIVE mg/dL
Hgb urine dipstick: NEGATIVE
Ketones, ur: NEGATIVE mg/dL
Leukocytes,Ua: NEGATIVE
Nitrite: NEGATIVE
Protein, ur: NEGATIVE mg/dL
Specific Gravity, Urine: 1.023 (ref 1.005–1.030)
pH: 6 (ref 5.0–8.0)

## 2018-08-05 NOTE — ED Triage Notes (Signed)
Pt states that 3 days ago he began to have right flank pain along with difficulty urinating ' pt further states that this morning while he was urinating , he passed a kidney stone ; denies any further pain or difficulty urinating ; pt denies any other symptoms

## 2018-08-05 NOTE — Discharge Instructions (Addendum)
You were seen in the ER today for difficulty urinating.  Your urinalysis was normal We suspect that you passed a kidney stone this morning.  We have provided urology follow up, you may also follow up with your primary care within 1 week.  Return to the ER for new or worsening symptoms including but not limited to return of pain, inability to urinate, trouble urinating, fever, or any other concerns.

## 2018-08-05 NOTE — ED Provider Notes (Signed)
[facility] EMERGENCY DEPARTMENT Provider Note   CSN: [id number] Arrival date & time: 08/05/18  9024     History   Chief Complaint Chief Complaint  Patient presents with  . Kidney stone    HPI Derek French is a 53 y.o. male w/ a hx of prior stroke & substance abuse who presents to the ED with complaint of an episode of painful urination w/ passage of what appeared to be a small stone just PTA. Patient states that for the past 3 days he has had some difficulty urinating described as only urinating a small amount each time associated w/ intermittent R flank pain. He states this AM he was having increased difficulty urinating and eventually a small stone came out of his urethra which was very painful. Following passage of the stone he was able to urinate normally w/o difficulty. He no longer feels he has to urinate, does not feel he is retaining, & denies any dysuria or hematuria. Additionally denies nausea, vomiting, abdominal pain, current flank pain, fever, chills, testicular pain/swelling, or penile discharge. No hx of similar. Currently asymptomatic.        HPI  Past Medical History:  Diagnosis Date  . Cocaine use   . Headache(784.0)   . Recurrent boils   . Stroke-like episode ([place])    07/2009, 03/2011, 09/2011    Patient Active Problem List   Diagnosis Date Noted  . Stroke (cerebrum) ([place]) 07/01/2015  . Cerebral ischemia 09/26/2011  . Hemiplegia, unspecified, affecting nondominant side 09/26/2011  . Tinnitus 09/26/2011  . CVA (cerebral infarction) 03/30/2011  . Dyslipidemia 03/30/2011  . Leukocytosis 03/30/2011  . Substance abuse ([place]) 03/30/2011  . Cigarette smoker 03/30/2011    Past Surgical History:  Procedure Laterality Date  . HERNIA REPAIR          Home Medications    Prior to Admission medications   Medication Sig Start Date End Date Taking? Authorizing Provider  amoxicillin-clavulanate (AUGMENTIN) 875-125 MG tablet Take 1 tablet 2  (two) times daily by mouth. One po bid x 7 days Patient not taking: Reported on 06/13/2017 01/10/17   Daleen Bo, MD  aspirin 325 MG tablet Take 1 tablet (325 mg total) by mouth daily. Patient not taking: Reported on 06/13/2017 07/04/15   Rinehuls, Reche Dixon  aspirin EC 81 MG tablet Take 81 mg by mouth daily.    [provider]  HYDROcodone-acetaminophen (NORCO) 5-325 MG tablet Take 1 tablet every 4 (four) hours as needed by mouth. Patient not taking: Reported on 06/13/2017 01/10/17   Daleen Bo, MD    Family History Family History  Problem Relation Age of Onset  . Stroke Father     Social History Social History   Tobacco Use  . Smoking status: Current Every Day Smoker    Packs/day: 1.00    Types: Cigarettes  . Smokeless tobacco: Never Used  Substance Use Topics  . Alcohol use: Yes    Comment: occ  . Drug use: Yes    Types: Marijuana, Cocaine    Comment: relapsed 1 week ago     Allergies   Bee venom and Codeine   Review of Systems Review of Systems  Constitutional: Negative for chills and fever.  Respiratory: Negative for shortness of breath.   Cardiovascular: Negative for chest pain.  Gastrointestinal: Negative for abdominal pain, nausea and vomiting.  Genitourinary: Positive for difficulty urinating (resolved @ present) and flank pain (resolved at present). Negative for discharge, dysuria, hematuria, scrotal swelling  and testicular pain.  All other systems reviewed and are negative.  Physical Exam Updated Vital Signs BP 133/78   Pulse 94   Temp 98.6 F (37 C) (Oral)   Resp 18   Ht 6\' 2"  (1.88 m)   Wt 83.9 kg   SpO2 98%   BMI 23.75 kg/m   Physical Exam Vitals signs and nursing note reviewed.  Constitutional:      General: He is not in acute distress.    Appearance: He is well-developed. He is not toxic-appearing.  HENT:     Head: Normocephalic and atraumatic.  Eyes:     General:        Right eye: No discharge.        Left eye: No  discharge.     Conjunctiva/sclera: Conjunctivae normal.  Neck:     Musculoskeletal: Neck supple.  Cardiovascular:     Rate and Rhythm: Normal rate and regular rhythm.  Pulmonary:     Effort: Pulmonary effort is normal. No respiratory distress.     Breath sounds: Normal breath sounds. No wheezing, rhonchi or rales.  Abdominal:     General: There is no distension.     Palpations: Abdomen is soft.     Tenderness: There is no abdominal tenderness. There is no right CVA tenderness, left CVA tenderness, guarding or rebound.  Genitourinary:    Comments: deferred Skin:    General: Skin is warm and dry.     Findings: No rash.  Neurological:     Mental Status: He is alert.     Comments: Clear speech.   Psychiatric:        Behavior: Behavior normal.    ED Treatments / Results  Labs (all labs ordered are listed, but only abnormal results are displayed) Labs Reviewed  URINALYSIS, ROUTINE W REFLEX MICROSCOPIC    EKG    Radiology No results found.  Procedures Procedures (including critical care time)  Medications Ordered in ED Medications - No data to display   Initial Impression / Assessment and Plan / ED Course  I have reviewed the triage vital signs and the nursing notes.  Pertinent labs & imaging results that were available during my care of the patient were reviewed by me and considered in my medical decision making (see chart for details).   Patient presents to the ED w/ 3 day hx of difficulty urinating & intermittent R flank pain, episode of very painful urination w/ passing of stone prompted ED visit this AM. Currently asymptomatic, no longer feels he is having difficulty urinating, does not feel he is retaining therefore bladder scan not performed. Nontoxic, vitals WNL, exam is benign w/o abdomen or CVA tenderness. Likely passed kidney stone. Able to urinate in the emergency department w/o difficulty. Urinalysis w/o abnormalities, no signs of UTI. Will discharge home w/  PCP follow up, also have provided information for urology as this is suspected to have been a passed stone. I discussed results, treatment plan, need for follow-up, and return precautions with the patient. Provided opportunity for questions, patient confirmed understanding and is in agreement with plan.   Findings and plan of care discussed with supervising physician Dr. Ranae PalmsYelverton who is in agreement.   Final Clinical Impressions(s) / ED Diagnoses   Final diagnoses:  Difficulty urinating    ED Discharge Orders    None       Cherly Andersonetrucelli, [place] Cloe R, PA-C 08/05/18 [id number]    Loren RacerYelverton, David, MD 08/05/18 1144

## 2019-02-05 ENCOUNTER — Other Ambulatory Visit: Payer: Self-pay

## 2019-02-05 DIAGNOSIS — Z20822 Contact with and (suspected) exposure to covid-19: Secondary | ICD-10-CM

## 2019-02-06 LAB — NOVEL CORONAVIRUS, NAA: SARS-CoV-2, NAA: NOT DETECTED

## 2019-05-02 ENCOUNTER — Encounter: Payer: Self-pay | Admitting: Family Medicine

## 2019-05-02 ENCOUNTER — Ambulatory Visit (INDEPENDENT_AMBULATORY_CARE_PROVIDER_SITE_OTHER): Payer: PRIVATE HEALTH INSURANCE | Admitting: Family Medicine

## 2019-05-02 ENCOUNTER — Other Ambulatory Visit: Payer: Self-pay

## 2019-05-02 VITALS — BP 164/84 | HR 74 | Ht 74.0 in | Wt 192.6 lb

## 2019-05-02 DIAGNOSIS — F1490 Cocaine use, unspecified, uncomplicated: Secondary | ICD-10-CM | POA: Diagnosis not present

## 2019-05-02 DIAGNOSIS — I10 Essential (primary) hypertension: Secondary | ICD-10-CM | POA: Diagnosis not present

## 2019-05-02 MED ORDER — AMLODIPINE BESYLATE 5 MG PO TABS: 5.0000 mg | ORAL_TABLET | Freq: Every day | ORAL | 3 refills | Status: DC

## 2019-05-02 NOTE — Patient Instructions (Addendum)
Attend a meeting today  Read the handout with resources  Please take amlodipine 5mg  for your blood pressure daily. Go to the ED if you have headache, changes in vision or chest pain.   Follow up in 1 week or sooner if needed.

## 2019-05-02 NOTE — Progress Notes (Signed)
    SUBJECTIVE:   CHIEF COMPLAINT / HPI:  Mr. Nieto is a 54 yo african [place] male that presents to establish care. 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 He currently is taking care of his elderly mother and recently lost his younger sister suddenly. He is anxious today because he feels as if his health is out of control and does not want to learn anything bad.  HTN Endorses 4-5 episodes a week of headaches and dizziness with feelings of being unbalanced as well as nosebleeds in the morning. He does not endorse chest pain or headache at this moment.   Substance Abuse He has been clean for 7 years but recently relapsed to using cocaine in November after the passing of his younger sister who died of a heart attack. He has gone to meeting and has a sponsor Onalee Hua but continues to use 1-2x per week. He voices understanding that this is related to his level of anxiety as well as headaches and high blood pressure. Current smoker of 1/2 pack a day.  PERTINENT  PMH / PSH: tobacco use disorder, HTN, headaches, cocaine abuse, CVA  OBJECTIVE:   BP (!) 164/84   Pulse 74   Ht 6\' 2"  (1.88 m)   Wt 192 lb 9.6 oz (87.4 kg)   SpO2 97%   BMI 24.73 kg/m    General: Appears anxious, no acute distress. Age appropriate. Cardiac: RRR, normal heart sounds, no murmurs Respiratory: CTAB, normal effort  ASSESSMENT/PLAN:   Hypertension Not well controlled. 164/84. Exacerbated by cocaine use. Medication naive. CMet unremarkable. LDL 128. -Start amlodipine 5mg  -consider starting statin therapy at subsequent visit -follow up in 1 week (scheduled 2 weeks out for continuity)  Cocaine use 7 years clean. Life stress induced relapse. Aware of SAA meetings. Has a sponsor and insight. -Grief and substance abuse resources given -Encouraged to go to a meeting today after visit -ED precautions given -Follow up in 1 week (scheduled 3/24  for continuity)   , DO [facility]

## 2019-05-03 ENCOUNTER — Encounter: Payer: Self-pay | Admitting: Family Medicine

## 2019-05-03 DIAGNOSIS — F1490 Cocaine use, unspecified, uncomplicated: Secondary | ICD-10-CM | POA: Insufficient documentation

## 2019-05-03 DIAGNOSIS — I10 Essential (primary) hypertension: Secondary | ICD-10-CM | POA: Insufficient documentation

## 2019-05-03 HISTORY — DX: Essential (primary) hypertension: I10

## 2019-05-03 LAB — LIPID PANEL
Chol/HDL Ratio: 3.6 ratio (ref 0.0–5.0)
Cholesterol, Total: 193 mg/dL (ref 100–199)
HDL: 54 mg/dL (ref >39)
LDL Chol Calc (NIH): 128 mg/dL — ABNORMAL HIGH (ref 0–99)
Triglycerides: 60 mg/dL (ref 0–149)
VLDL Cholesterol Cal: 11 mg/dL (ref 5–40)

## 2019-05-03 LAB — COMPREHENSIVE METABOLIC PANEL
ALT: 16 IU/L (ref 0–44)
AST: 17 IU/L (ref 0–40)
Albumin/Globulin Ratio: 1.7 (ref 1.2–2.2)
Albumin: 4.3 g/dL (ref 3.8–4.9)
Alkaline Phosphatase: 70 IU/L (ref 39–117)
BUN/Creatinine Ratio: 16 (ref 9–20)
BUN: 18 mg/dL (ref 6–24)
Bilirubin Total: 0.4 mg/dL (ref 0.0–1.2)
CO2: 21 mmol/L (ref 20–29)
Calcium: 9.4 mg/dL (ref 8.7–10.2)
Chloride: 106 mmol/L (ref 96–106)
Creatinine, Ser: 1.13 mg/dL (ref 0.76–1.27)
GFR calc Af Amer: 85 mL/min/{1.73_m2} (ref >59)
GFR calc non Af Amer: 74 mL/min/{1.73_m2} (ref >59)
Globulin, Total: 2.6 g/dL (ref 1.5–4.5)
Glucose: 100 mg/dL — ABNORMAL HIGH (ref 65–99)
Potassium: 4.4 mmol/L (ref 3.5–5.2)
Sodium: 141 mmol/L (ref 134–144)
Total Protein: 6.9 g/dL (ref 6.0–8.5)

## 2019-05-03 NOTE — Assessment & Plan Note (Signed)
Not well controlled. 164/84. Exacerbated by cocaine use. Medication naive. CMet unremarkable. LDL 128. -Start amlodipine 5mg  -consider starting statin therapy at subsequent visit -follow up in 1 week (scheduled 2 weeks out for continuity)

## 2019-05-03 NOTE — Assessment & Plan Note (Addendum)
7 years clean. Life stress induced relapse. Aware of SAA meetings. Has a sponsor and insight. -Grief and substance abuse resources given -Encouraged to go to a meeting today after visit -ED precautions given -Follow up in 1 week (scheduled 3/24 for continuity)

## 2019-05-04 ENCOUNTER — Telehealth: Payer: Self-pay

## 2019-05-04 NOTE — Telephone Encounter (Signed)
Patient calls nurse line stating that he is returning phone call from provider. Informed patient of lab results, elevated LDL and that he will f/u on this with provider on 3/24.   Pt appreciative and verbalized understanding.   To PCP  [name], RN

## 2019-05-05 ENCOUNTER — Other Ambulatory Visit: Payer: Self-pay

## 2019-05-05 ENCOUNTER — Encounter (HOSPITAL_COMMUNITY): Payer: Self-pay | Admitting: Emergency Medicine

## 2019-05-05 ENCOUNTER — Emergency Department (HOSPITAL_COMMUNITY)
Admission: EM | Admit: 2019-05-05 | Discharge: 2019-05-05 | Disposition: A | Discharge status: Home / Self Care | Payer: Self-pay | Attending: Emergency Medicine | Admitting: Emergency Medicine

## 2019-05-05 ENCOUNTER — Emergency Department (HOSPITAL_COMMUNITY): Payer: Self-pay

## 2019-05-05 DIAGNOSIS — S60111A Contusion of right thumb with damage to nail, initial encounter: Secondary | ICD-10-CM | POA: Diagnosis not present

## 2019-05-05 DIAGNOSIS — Y9389 Activity, other specified: Secondary | ICD-10-CM | POA: Diagnosis not present

## 2019-05-05 DIAGNOSIS — Y999 Unspecified external cause status: Secondary | ICD-10-CM | POA: Diagnosis not present

## 2019-05-05 DIAGNOSIS — W230XXA Caught, crushed, jammed, or pinched between moving objects, initial encounter: Secondary | ICD-10-CM | POA: Insufficient documentation

## 2019-05-05 DIAGNOSIS — Y929 Unspecified place or not applicable: Secondary | ICD-10-CM | POA: Insufficient documentation

## 2019-05-05 DIAGNOSIS — S6010XA Contusion of unspecified finger with damage to nail, initial encounter: Secondary | ICD-10-CM

## 2019-05-05 DIAGNOSIS — F17210 Nicotine dependence, cigarettes, uncomplicated: Secondary | ICD-10-CM | POA: Insufficient documentation

## 2019-05-05 DIAGNOSIS — S6991XA Unspecified injury of right wrist, hand and finger(s), initial encounter: Secondary | ICD-10-CM | POA: Diagnosis present

## 2019-05-05 MED ORDER — BUPIVACAINE HCL (PF) 0.5 % IJ SOLN
20.0000 mL | Freq: Once | INTRAMUSCULAR | Status: DC
  Filled 2019-05-05: qty 20

## 2019-05-05 MED ORDER — HYDROCODONE-ACETAMINOPHEN 5-325 MG PO TABS
1.0000 | ORAL_TABLET | Freq: Once | ORAL | Status: AC
  Administered 2019-05-05: 18:00:00 1 via ORAL
  Filled 2019-05-05: qty 1

## 2019-05-05 NOTE — Discharge Instructions (Addendum)
Your x-ray today did not show any evidence of broken bones.  As we discussed, you continue to treat with the thumb.  Gently press on it to relieve any blood thinner still in there.  Gently wash with soap and water. Make sure to pat the thumb dry.   Return emergency department for any worsening pain, swelling, fevers or any other worsening or concerning symptoms.

## 2019-05-05 NOTE — ED Provider Notes (Signed)
[facility] EMERGENCY DEPARTMENT Provider Note   CSN: [id number] Arrival date & time: 05/05/19  1559     History Chief Complaint  Patient presents with  . Finger Injury    Derek French is a 54 y.o. male presents for evaluation of right thumb swelling.  He reports that at about 9 AM this morning, he caught his finger in the door.  Patient reports that since then, he has had pain, swelling to the thumb.  Denies any numbness/weakness.  He is on blood thinners.  He has not taken any medication pain.  He states pain is worse when he.   The history is provided by the patient.       Past Medical History:  Diagnosis Date  . Cocaine use   . Headache(784.0)   . Hypertension 05/03/2019  . Recurrent boils   . Stroke-like episode    07/2009, 03/2011, 09/2011    Patient Active Problem List   Diagnosis Date Noted  . Hypertension 05/03/2019  . Cocaine use   . Stroke (cerebrum) ([facility]) 07/01/2015  . Cerebral ischemia 09/26/2011  . Hemiplegia, unspecified, affecting nondominant side 09/26/2011  . Tinnitus 09/26/2011  . CVA (cerebral infarction) 03/30/2011  . Dyslipidemia 03/30/2011  . Leukocytosis 03/30/2011  . Substance abuse ([facility]) 03/30/2011  . Cigarette smoker 03/30/2011    Past Surgical History:  Procedure Laterality Date  . HERNIA REPAIR         Family History  Problem Relation Age of Onset  . Stroke Father     Social History   Tobacco Use  . Smoking status: Current Every Day Smoker    Packs/day: 1.00    Types: Cigarettes  . Smokeless tobacco: Never Used  Substance Use Topics  . Alcohol use: Yes    Comment: occ  . Drug use: Yes    Types: Marijuana, Cocaine    Comment: relapsed 1 week ago    Home Medications Prior to Admission medications   Medication Sig Start Date End Date Taking? Authorizing Provider  amLODipine (NORVASC) 5 MG tablet Take 1 tablet (5 mg total) by mouth at bedtime. 05/02/19   Autry-Lott, Randa Evens, DO    Allergies    Bee  venom and Codeine  Review of Systems   Review of Systems  Musculoskeletal:       Thumb pain  Skin: Positive for color change (Nail discoloration). Negative for wound.  Neurological: Negative for weakness and numbness.  All other systems reviewed and are negative.   Physical Exam Updated Vital Signs BP (!) 150/91 (BP Location: Right Arm)   Pulse 80   Temp 97.7 F (36.5 C) (Oral)   Resp 16   SpO2 96%   Physical Exam Vitals and nursing note reviewed.  Constitutional:      Appearance: He is well-developed.  HENT:     Head: Normocephalic and atraumatic.  Eyes:     General: No scleral icterus.       Right eye: No discharge.        Left eye: No discharge.     Conjunctiva/sclera: Conjunctivae normal.  Cardiovascular:     Pulses:          Radial pulses are 2+ on the right side and 2+ on the left side.  Pulmonary:     Effort: Pulmonary effort is normal.  Musculoskeletal:     Comments: Palpation distal aspect of right thumb with soft tissue swelling.  Some of the hematoma noted nail onto the right thumb.  No  bony deformity noted to IP or MCP of the right thumb.  Flexion/digit intact but with difficulty.  No bony tenderness noted to the digit, proximal hand.  Skin:    General: Skin is warm and dry.     Capillary Refill: Capillary refill takes less than 2 seconds.     Comments: Good distal cap refill. RUE is not dusky in appearance or cool to touch.  Neurological:     Mental Status: He is alert.  Psychiatric:        Speech: Speech normal.        Behavior: Behavior normal.     ED Results / Procedures / Treatments   Labs (all labs ordered are listed, but only abnormal results are displayed) Labs Reviewed - No data to display  EKG None  Radiology DG Hand Complete Right  Result Date: 05/05/2019 CLINICAL DATA:  Right thumb pain, closed in car door EXAM: RIGHT HAND - COMPLETE 3+ VIEW COMPARISON:  None. FINDINGS: There is no evidence of fracture or dislocation. There is no  evidence of arthropathy or other focal bone abnormality. Soft tissue edema about the thumb. IMPRESSION: No fracture or dislocation of the right thumb.  Soft tissue edema. Electronically Signed   By: Eddie Candle M.D.   On: 05/05/2019 16:33    Procedures .Nail Removal  Date/Time: 05/05/2019 6:02 PM Performed by: Volanda Napoleon, PA-C Authorized by: Volanda Napoleon, PA-C   Consent:    Consent obtained:  Verbal   Consent given by:  Patient   Risks discussed:  Bleeding, incomplete removal, infection and pain Location:    Hand:  R thumb Pre-procedure details:    Skin preparation:  Alcohol Trephination:    Subungual hematoma drained: yes     Trephination instrument:  Cautery Post-procedure details:    Patient tolerance of procedure:  Tolerated well, no immediate complications   (including critical care time)  Medications Ordered in ED Medications  bupivacaine (MARCAINE) 0.5 % injection 20 mL (has no administration in time range)  HYDROcodone-acetaminophen (NORCO/VICODIN) 5-325 MG per tablet 1 tablet (1 tablet Oral Given 05/05/19 1755)    ED Course  I have reviewed the triage vital signs and the nursing notes.  Pertinent labs & imaging results that were available during my care of the patient were reviewed by me and considered in my medical decision making (see chart for details).    MDM Rules/Calculators/A&P                      54 year old male presents for evaluation of right thumb pain, swelling.  He reports it started after he got caught in a door. On initial arrival, he is afebrile, nontoxic-appearing.  Vitals are stable.  He is neurovascularly intact.  On exam, he has evidence of subungual hematoma noted to the right thumb with some soft tissue swelling.  No deformity or crepitus noted.  X-rays ordered at triage.  X-rays reviewed.  Negative for any acute bony malady.  Subungual hematoma drained as documented above.  Patient tolerated procedure well.  Encouraged  outpatient care measures. At this time, patient exhibits no emergent life-threatening condition that require further evaluation in ED or admission. Patient had ample opportunity for questions and discussion. All patient's questions were answered with full understanding. Strict return precautions discussed. Patient expresses understanding and agreement to plan. Patient had ample opportunity for questions and discussion. All patient's questions were answered with full understanding.  Portions of this note were generated with Dragon dictation  software. Dictation errors may occur despite best attempts at proofreading.  Final Clinical Impression(s) / ED Diagnoses Final diagnoses:  Subungual hematoma of digit of hand, initial encounter    Rx / DC Orders ED Discharge Orders    None       Rosana Hoes 05/05/19 2215    Sabas Sous, MD 05/06/19 [phone]

## 2019-05-05 NOTE — ED Triage Notes (Signed)
Smashed R thumb in car door at 9am.  C/o severe pain, swelling, and discoloration to nail bed.

## 2019-05-05 NOTE — ED Notes (Signed)
Patient given discharge instructions patient verbalizes understanding. 

## 2019-05-16 ENCOUNTER — Encounter: Payer: Self-pay | Admitting: Family Medicine

## 2019-05-16 ENCOUNTER — Other Ambulatory Visit: Payer: Self-pay

## 2019-05-16 ENCOUNTER — Ambulatory Visit (INDEPENDENT_AMBULATORY_CARE_PROVIDER_SITE_OTHER): Payer: PRIVATE HEALTH INSURANCE | Admitting: Family Medicine

## 2019-05-16 VITALS — BP 140/78 | HR 93 | Ht 74.0 in | Wt 194.2 lb

## 2019-05-16 DIAGNOSIS — F17210 Nicotine dependence, cigarettes, uncomplicated: Secondary | ICD-10-CM | POA: Diagnosis not present

## 2019-05-16 DIAGNOSIS — E785 Hyperlipidemia, unspecified: Secondary | ICD-10-CM

## 2019-05-16 DIAGNOSIS — F1490 Cocaine use, unspecified, uncomplicated: Secondary | ICD-10-CM

## 2019-05-16 DIAGNOSIS — I6359 Cerebral infarction due to unspecified occlusion or stenosis of other cerebral artery: Secondary | ICD-10-CM

## 2019-05-16 DIAGNOSIS — I10 Essential (primary) hypertension: Secondary | ICD-10-CM | POA: Diagnosis not present

## 2019-05-16 DIAGNOSIS — S6010XA Contusion of unspecified finger with damage to nail, initial encounter: Secondary | ICD-10-CM | POA: Insufficient documentation

## 2019-05-16 DIAGNOSIS — S6010XD Contusion of unspecified finger with damage to nail, subsequent encounter: Secondary | ICD-10-CM

## 2019-05-16 MED ORDER — NICOTINE 14 MG/24HR TD PT24: 14.0000 mg | MEDICATED_PATCH | Freq: Every day | TRANSDERMAL | 0 refills | Status: DC

## 2019-05-16 MED ORDER — ATORVASTATIN CALCIUM 40 MG PO TABS: 40.0000 mg | ORAL_TABLET | Freq: Every day | ORAL | 3 refills | Status: DC

## 2019-05-16 MED ORDER — ASPIRIN EC 81 MG PO TBEC: 81.0000 mg | DELAYED_RELEASE_TABLET | Freq: Every day | ORAL | 0 refills | Status: DC

## 2019-05-16 MED FILL — NICOTINE 14 MG/24HR PATCH: 14 | 14 days supply | Qty: 14 | Fill #0

## 2019-05-16 NOTE — Assessment & Plan Note (Signed)
2 weeks sober. Has sponsor. -Continue SAA meetings -Continued sobriety encouraged; discussed healthy habit forming behavior

## 2019-05-16 NOTE — Patient Instructions (Addendum)
Nicotine patches: Use a new 14 mg patch every morning for 6 weeks. Then use new 7 mg patches every morning for 2 weeks Start taking 81mg  aspirin daily Start atorvastatin 40 mg daily Continue SAA meetings Keep thumb wound clean and dry.  Follow up in 6 months for blood pressure check or sooner if needed.   Please call the clinic at [phone] if your symptoms worsen or you have any concerns. It was our pleasure to serve you.

## 2019-05-16 NOTE — Assessment & Plan Note (Signed)
-  Start 14mg  patch daily for 6 weeks then, 7mg  patch for 2 weeks (stop smoking when starting) -1-800-QUIT-NOW information given

## 2019-05-16 NOTE — Progress Notes (Signed)
    SUBJECTIVE:   CHIEF COMPLAINT / HPI:   Derek French presents today for a follow up visit.   HTN Continues to endorse headache, nosebleeds, feelings of being unbalanced, and dizziness when bending over then standing up. He explains it as having some improvement in these areas since started medication for his blood pressure. He is not experiencing changes in his vision and endorses daily compliance with his medication. He does not have a machine at home to check his blood pressure but will occasionally do so at [facility] He has not checked it since our last visit.   Right thumb subungual hematoma Shut his right thumb in his sister's SUV back door 11 days ago for about 8 minutes until someone came to help. Was seen in the ED where his nail was micro punctured x3. It continues to be swollen and painful to touch. He has kept the sight clean and covered in gauze.  Tobacco use disorder Current everyday smoker. 1/2 pack per day. Would like help with cessation.  Cocaine Use Endorses sobriety since our last visit and has attended several SAA meetings. Last meeting attended was last night.   PERTINENT  PMH / PSH: CVA, Substance abuse, Cigarette smoker, dyslipidemia  OBJECTIVE:   BP 140/78   Pulse 93   Ht 6\' 2"  (1.88 m)   Wt 194 lb 4 oz (88.1 kg)   SpO2 97%   BMI 24.94 kg/m    General: Appears well, no acute distress. Age appropriate. Cardiac: RRR, normal heart sounds, no murmurs Respiratory: CTAB, normal effort Media Information   Document Information  Photos  Right thumb  05/16/2019 13:51  Attached To:  Office Visit on 05/16/19 with Autry-Lott, 05/18/19, DO  Source Information  Autry-Lott, Randa Evens, DO  [facility] Resident   ASSESSMENT/PLAN:   Hypertension BP 140/78 (JNC8 Goal is <140/90). Improving symptoms.  -Continue Amlodipine 5mg  daily -Follow up in 6 months or sooner if needed  Subungual hematoma of digit of hand Non-infectious appearing. Will continue to need time  to heal conservatively.  -Continue dressing changes giving time for open to air when not using digit -Ibuprofen OTC PRN -Follow up PRN or if symptoms fail to improve  Dyslipidemia ASCVD risk 18.9%. LDL 128.  -Start Atorvastatin 40mg  daily  Cigarette smoker -Start 14mg  patch daily for 6 weeks then, 7mg  patch for 2 weeks (stop smoking when starting) -1-800-QUIT-NOW information given  Cocaine use 2 weeks sober. Has sponsor. -Continue SAA meetings -Continued sobriety encouraged; discussed healthy habit forming behavior   Randa Evens, DO [facility]

## 2019-05-16 NOTE — Assessment & Plan Note (Signed)
Non-infectious appearing. Will continue to need time to heal conservatively.  -Continue dressing changes giving time for open to air when not using digit -Ibuprofen OTC PRN -Follow up PRN or if symptoms fail to improve

## 2019-05-16 NOTE — Assessment & Plan Note (Signed)
ASCVD risk 18.9%. LDL 128.  -Start Atorvastatin 40mg  daily

## 2019-05-16 NOTE — Assessment & Plan Note (Signed)
BP 140/78 (JNC8 Goal is <140/90). Improving symptoms.  -Continue Amlodipine 5mg  daily -Follow up in 6 months or sooner if needed

## 2019-06-18 ENCOUNTER — Other Ambulatory Visit: Payer: Self-pay

## 2019-06-18 ENCOUNTER — Emergency Department (HOSPITAL_COMMUNITY)
Admission: EM | Admit: 2019-06-18 | Discharge: 2019-06-18 | Disposition: A | Discharge status: Home / Self Care | Payer: PRIVATE HEALTH INSURANCE | Attending: Emergency Medicine | Admitting: Emergency Medicine

## 2019-06-18 DIAGNOSIS — Z79899 Other long term (current) drug therapy: Secondary | ICD-10-CM | POA: Insufficient documentation

## 2019-06-18 DIAGNOSIS — L0231 Cutaneous abscess of buttock: Secondary | ICD-10-CM | POA: Insufficient documentation

## 2019-06-18 DIAGNOSIS — Z7982 Long term (current) use of aspirin: Secondary | ICD-10-CM | POA: Insufficient documentation

## 2019-06-18 DIAGNOSIS — I10 Essential (primary) hypertension: Secondary | ICD-10-CM | POA: Insufficient documentation

## 2019-06-18 DIAGNOSIS — Z8673 Personal history of transient ischemic attack (TIA), and cerebral infarction without residual deficits: Secondary | ICD-10-CM | POA: Insufficient documentation

## 2019-06-18 MED ORDER — HYDROCODONE-ACETAMINOPHEN 5-325 MG PO TABS
1.0000 | ORAL_TABLET | Freq: Once | ORAL | Status: AC
  Administered 2019-06-18: 1 via ORAL
  Filled 2019-06-18: qty 1

## 2019-06-18 MED ORDER — LIDOCAINE-EPINEPHRINE (PF) 2 %-1:200000 IJ SOLN
10.0000 mL | Freq: Once | INTRAMUSCULAR | Status: AC
  Administered 2019-06-18: 10 mL
  Filled 2019-06-18: qty 20

## 2019-06-18 MED ORDER — SULFAMETHOXAZOLE-TRIMETHOPRIM 800-160 MG PO TABS: 1.0000 | ORAL_TABLET | Freq: Two times a day (BID) | ORAL | 0 refills | Status: AC

## 2019-06-18 MED ORDER — SULFAMETHOXAZOLE-TRIMETHOPRIM 800-160 MG PO TABS
1.0000 | ORAL_TABLET | Freq: Once | ORAL | Status: AC
  Administered 2019-06-18: 1 via ORAL
  Filled 2019-06-18: qty 1

## 2019-06-18 MED ORDER — HYDROCODONE-ACETAMINOPHEN 5-325 MG PO TABS: ORAL_TABLET | ORAL | 0 refills | Status: DC

## 2019-06-18 MED ORDER — MUPIROCIN CALCIUM 2 % NA OINT: TOPICAL_OINTMENT | NASAL | 1 refills | Status: DC

## 2019-06-18 NOTE — ED Triage Notes (Signed)
Pt here for evaluation of golf ball size abscess on buttocks since Friday. Painful to sit down. Trying hot baths and OTC topical medication without improvement. Hx same. Denies fevers or chills.

## 2019-06-18 NOTE — ED Provider Notes (Signed)
[facility] EMERGENCY DEPARTMENT Provider Note   CSN: [id number] Arrival date & time: 06/18/19  4235     History Chief Complaint  Patient presents with  . Abscess    Derek French is a 54 y.o. male.  Patient presents the emergency department with complaint of a boil on his buttocks starting 3 days ago.  Area is gradually worsened to become larger.  He operates a Forensic scientist at night.  He was able to work from 9 PM until 3 AM today but the pain became so bad that he went home.  He took a warm bath.  This did not help.  Pain is worsening which is why he presents to the emergency department today.  No fevers, nausea or vomiting.  He has been using boil ease and warm soaks at home over the past couple days which has helped previous abscesses.        Past Medical History:  Diagnosis Date  . Cocaine use   . Headache(784.0)   . Hypertension 05/03/2019  . Recurrent boils   . Stroke-like episode    07/2009, 03/2011, 09/2011    Patient Active Problem List   Diagnosis Date Noted  . Subungual hematoma of digit of hand 05/16/2019  . Hypertension 05/03/2019  . Cocaine use   . Stroke (cerebrum) ([place]) 07/01/2015  . Cerebral ischemia 09/26/2011  . CVA (cerebral infarction) 03/30/2011  . Dyslipidemia 03/30/2011  . Substance abuse ([place]) 03/30/2011  . Cigarette smoker 03/30/2011    Past Surgical History:  Procedure Laterality Date  . HERNIA REPAIR         Family History  Problem Relation Age of Onset  . Stroke Father     Social History   Tobacco Use  . Smoking status: Current Every Day Smoker    Packs/day: 1.00    Types: Cigarettes  . Smokeless tobacco: Never Used  Substance Use Topics  . Alcohol use: Yes    Comment: occ  . Drug use: Yes    Types: Marijuana, Cocaine    Comment: relapsed 1 week ago    Home Medications Prior to Admission medications   Medication Sig Start Date End Date Taking? Authorizing Provider  amLODipine (NORVASC) 5 MG tablet  Take 1 tablet (5 mg total) by mouth at bedtime. 05/02/19   Autry-Lott, Naaman Plummer, DO  aspirin EC 81 MG tablet Take 1 tablet (81 mg total) by mouth daily. 05/16/19   Autry-Lott, Naaman Plummer, DO  atorvastatin (LIPITOR) 40 MG tablet Take 1 tablet (40 mg total) by mouth daily. 05/16/19   Autry-Lott, Naaman Plummer, DO  nicotine (NICODERM CQ - DOSED IN MG/24 HOURS) 14 mg/24hr patch Place 1 patch (14 mg total) onto the skin daily. Use a new 14 mg patch every morning for 6 weeks. Then use new 7 mg patches every morning for 2 weeks 05/16/19   Autry-Lott, Naaman Plummer, DO    Allergies    Bee venom and Codeine  Review of Systems   Review of Systems  Constitutional: Negative for fever.  Gastrointestinal: Negative for nausea and vomiting.  Skin: Negative for color change.       Positive for abscess  Hematological: Negative for adenopathy.    Physical Exam Updated Vital Signs BP (!) 167/107 (BP Location: Right Arm)   Pulse (!) 101   Temp 97.8 F (36.6 C) (Oral)   Resp 20   SpO2 95%   Physical Exam Vitals and nursing note reviewed.  Constitutional:      Appearance: He is well-developed.  HENT:     Head: Normocephalic and atraumatic.  Eyes:     Conjunctiva/sclera: Conjunctivae normal.  Pulmonary:     Effort: No respiratory distress.  Genitourinary:    Comments: Patient with 6 to 7 cm area of induration along the left gluteal cleft with 3 areas that are raised, pustular in appearance.  Severe tenderness with palpation.  No active drainage. Musculoskeletal:     Cervical back: Normal range of motion and neck supple.  Skin:    General: Skin is warm and dry.  Neurological:     Mental Status: He is alert.     ED Results / Procedures / Treatments   Labs (all labs ordered are listed, but only abnormal results are displayed) Labs Reviewed - No data to display  EKG None  Radiology No results found.  Procedures .Marland KitchenIncision and Drainage  Date/Time: 06/18/2019 10:14 AM Performed by: Renne Crigler,  PA-C Authorized by: Renne Crigler, PA-C   Consent:    Consent obtained:  Verbal   Consent given by:  Patient   Risks discussed:  Pain, infection and bleeding   Alternatives discussed:  No treatment Location:    Type:  Abscess   Size:  6cm   Location:  Anogenital   Anogenital location:  Gluteal cleft Pre-procedure details:    Skin preparation:  Betadine Anesthesia (see MAR for exact dosages):    Anesthesia method:  Local infiltration   Local anesthetic:  Lidocaine 2% WITH epi Procedure type:    Complexity:  Complex Procedure details:    Incision types:  Stab incision   Incision depth:  Dermal   Scalpel blade:  11   Wound management:  Probed and deloculated   Drainage:  Bloody and purulent   Drainage amount:  Copious   Wound treatment:  Wound left open   Packing materials:  None Post-procedure details:    Patient tolerance of procedure:  Tolerated well, no immediate complications   (including critical care time)  Medications Ordered in ED Medications  lidocaine-EPINEPHrine (XYLOCAINE W/EPI) 2 %-1:200000 (PF) injection 10 mL (has no administration in time range)  HYDROcodone-acetaminophen (NORCO/VICODIN) 5-325 MG per tablet 1 tablet (has no administration in time range)    ED Course  I have reviewed the triage vital signs and the nursing notes.  Pertinent labs & imaging results that were available during my care of the patient were reviewed by me and considered in my medical decision making (see chart for details).  Patient seen and examined. Discussed I&D, pt agrees to proceed.   Vital signs reviewed and are as follows: BP (!) 167/107 (BP Location: Right Arm)   Pulse (!) 101   Temp 97.8 F (36.6 C) (Oral)   Resp 20   SpO2 95%   Pt tolerated well.  The patient was urged to return to the Emergency Department urgently with worsening pain, swelling, expanding erythema especially if it streaks away from the affected area, fever, or if they have any other concerns.    The patient was urged to return to the Emergency Department or go to their PCP in 48 hours for wound recheck if the area is not significantly improved.  Patient counseled on use of narcotic pain medications. Counseled not to combine these medications with others containing tylenol. Urged not to drink alcohol, drive, or perform any other activities that requires focus while taking these medications. The patient verbalizes understanding and agrees with the plan.   The patient verbalized understanding and stated agreement with this plan.  MDM Rules/Calculators/A&P                      Patient with skin abscess amenable to incision and drainage. No signs of cellulitis is surrounding skin. Abs given due to location and size of abscess.   Final Clinical Impression(s) / ED Diagnoses Final diagnoses:  Abscess of left buttock    Rx / DC Orders ED Discharge Orders         Ordered    HYDROcodone-acetaminophen (NORCO/VICODIN) 5-325 MG tablet     06/18/19 1012    sulfamethoxazole-trimethoprim (BACTRIM DS) 800-160 MG tablet  2 times daily     06/18/19 1012    mupirocin nasal ointment (BACTROBAN) 2 %     06/18/19 1012           Renne Crigler, PA-C 06/18/19 1017    Tilden Fossa, MD 06/18/19 1030

## 2019-06-18 NOTE — ED Notes (Signed)
Full set of vitals documented for discharge,.

## 2019-06-18 NOTE — Discharge Instructions (Signed)
Please read and follow all provided instructions.  Your diagnoses today include:  1. Abscess of left buttock     Tests performed today include:  Vital signs. See below for your results today.   Medications prescribed:   Bactrim (trimethoprim/sulfamethoxazole) - antibiotic  You have been prescribed an antibiotic medicine: take the entire course of medicine even if you are feeling better. Stopping early can cause the antibiotic not to work.   Vicodin (hydrocodone/acetaminophen) - narcotic pain medication  DO NOT drive or perform any activities that require you to be awake and alert because this medicine can make you drowsy. BE VERY CAREFUL not to take multiple medicines containing Tylenol (also called acetaminophen). Doing so can lead to an overdose which can damage your liver and cause liver failure and possibly death.  Take any prescribed medications only as directed.   Home care instructions:   Follow any educational materials contained in this packet  Follow-up instructions: Return to the Emergency Department in 48 hours for a recheck if your symptoms are not significantly improved.  Please follow-up with your primary care provider in the next 1 week for further evaluation of your symptoms.   Return instructions:  Return to the Emergency Department if you have:  Fever  Worsening symptoms  Worsening pain  Worsening swelling  Redness of the skin that moves away from the affected area, especially if it streaks away from the affected area   Any other emergent concerns  Additional Information: If you have recurrent abscesses, try both the following. Use a Qtip to apply an over-the-counter antibiotic to the inside of your nostrils, twice a day for 5 days. Wash your body with over-the-counter Hibaclens once a day for one week and then once every two weeks. This can reduce the amount of bacteria on your skin that causes boils and lead to fewer boils. If you continue to have  multiple or recurrent boils, you should see a dermatologist (skin doctor).   Your vital signs today were: BP (!) 167/107 (BP Location: Right Arm)   Pulse (!) 101   Temp 97.8 F (36.6 C) (Oral)   Resp 20   SpO2 95%  If your blood pressure (BP) was elevated above 135/85 this visit, please have this repeated by your doctor within one month. --------------

## 2020-04-13 IMAGING — DX DG HAND COMPLETE 3+V*R*
3 series · 3 of 3 positions shown · non-contrast
Comparison: None.

CLINICAL DATA: Right thumb pain, closed in car door

EXAM:
RIGHT HAND - COMPLETE 3+ VIEW

[hand pa]
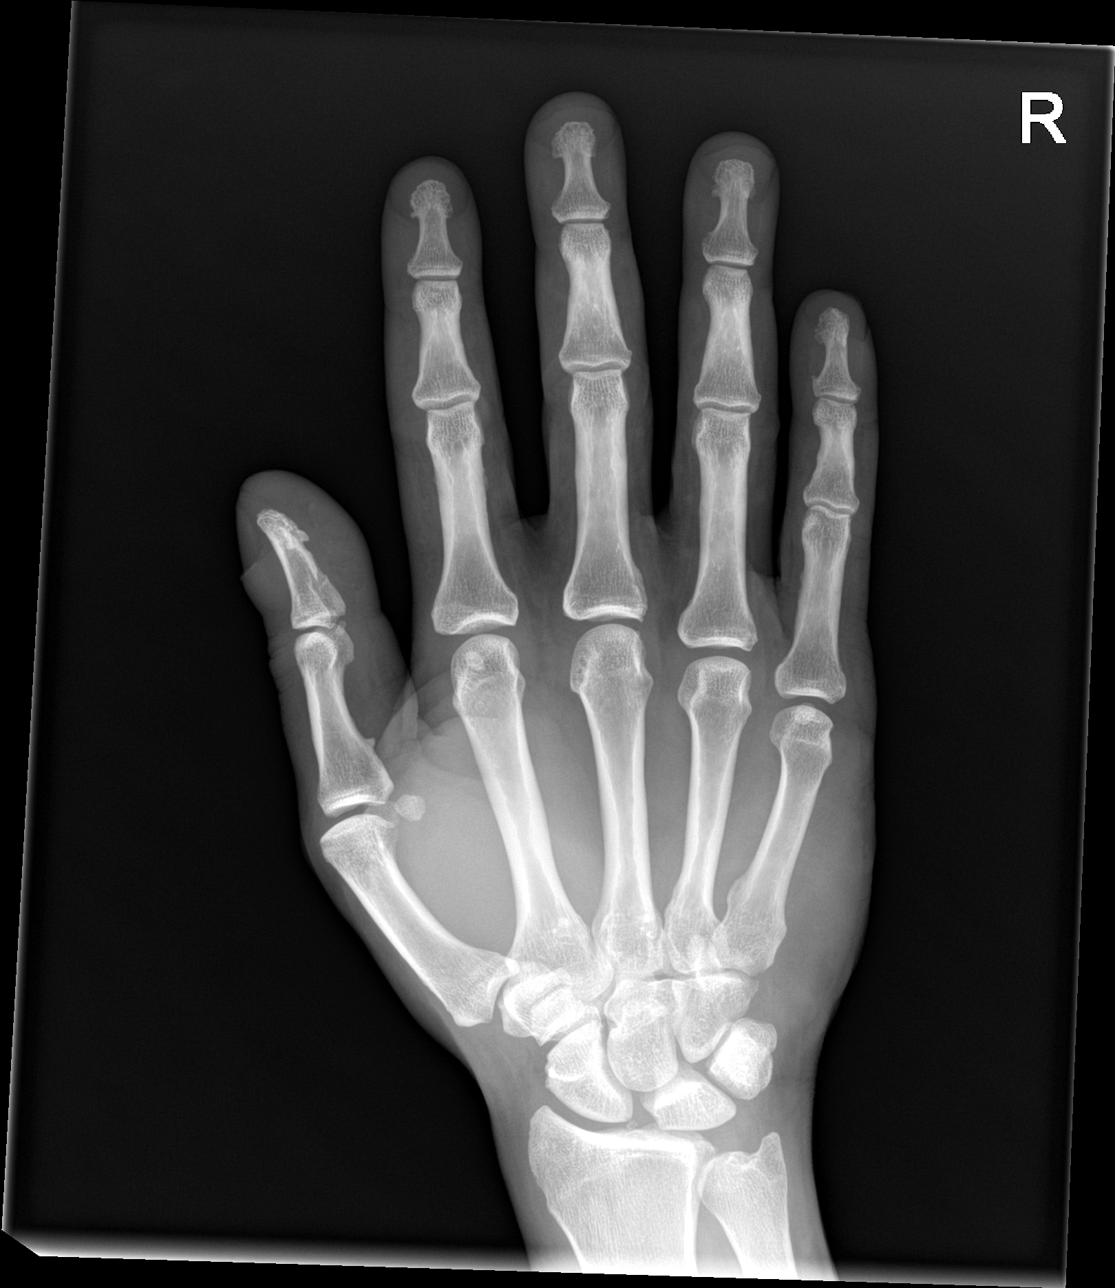

[hand obl]
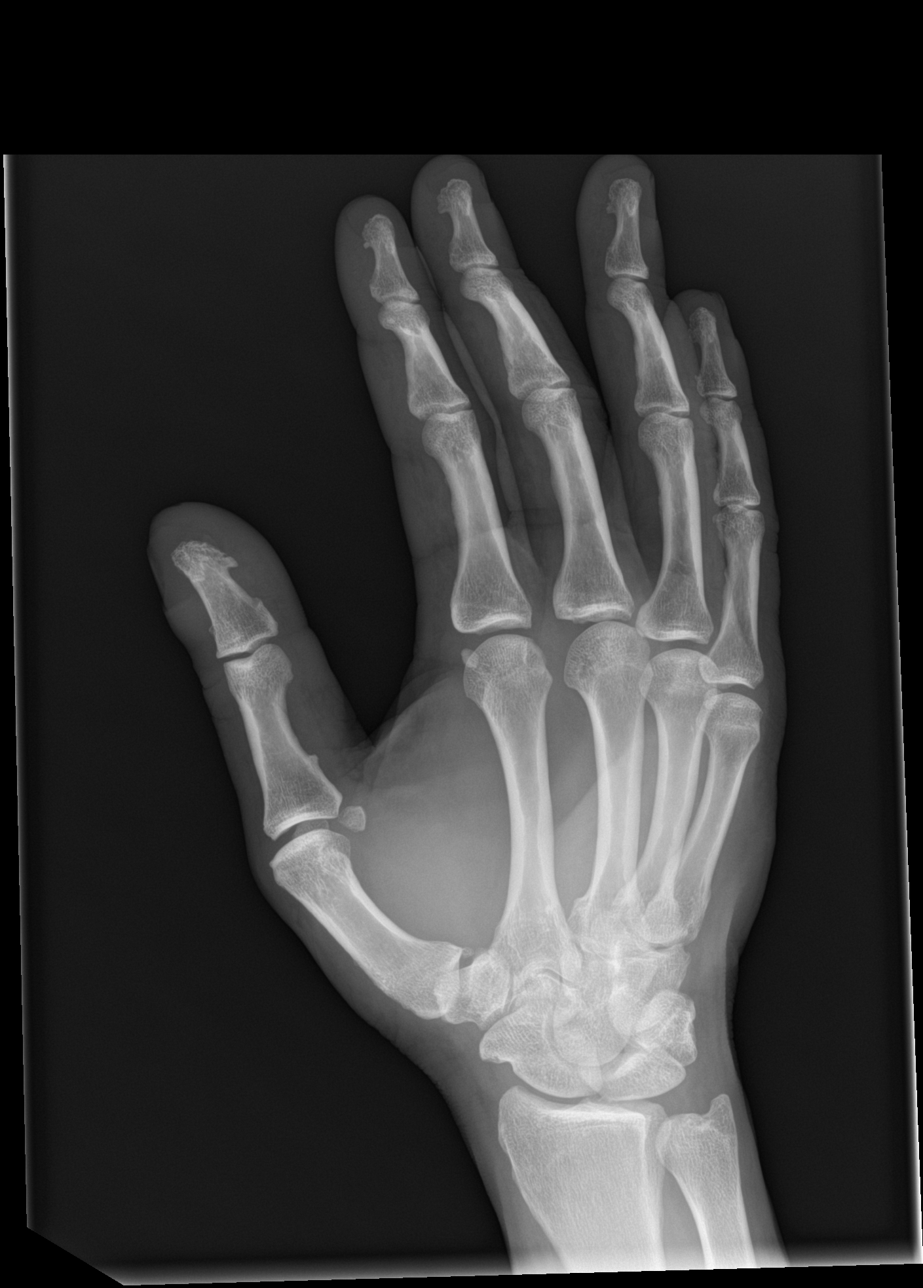

[hand lat]
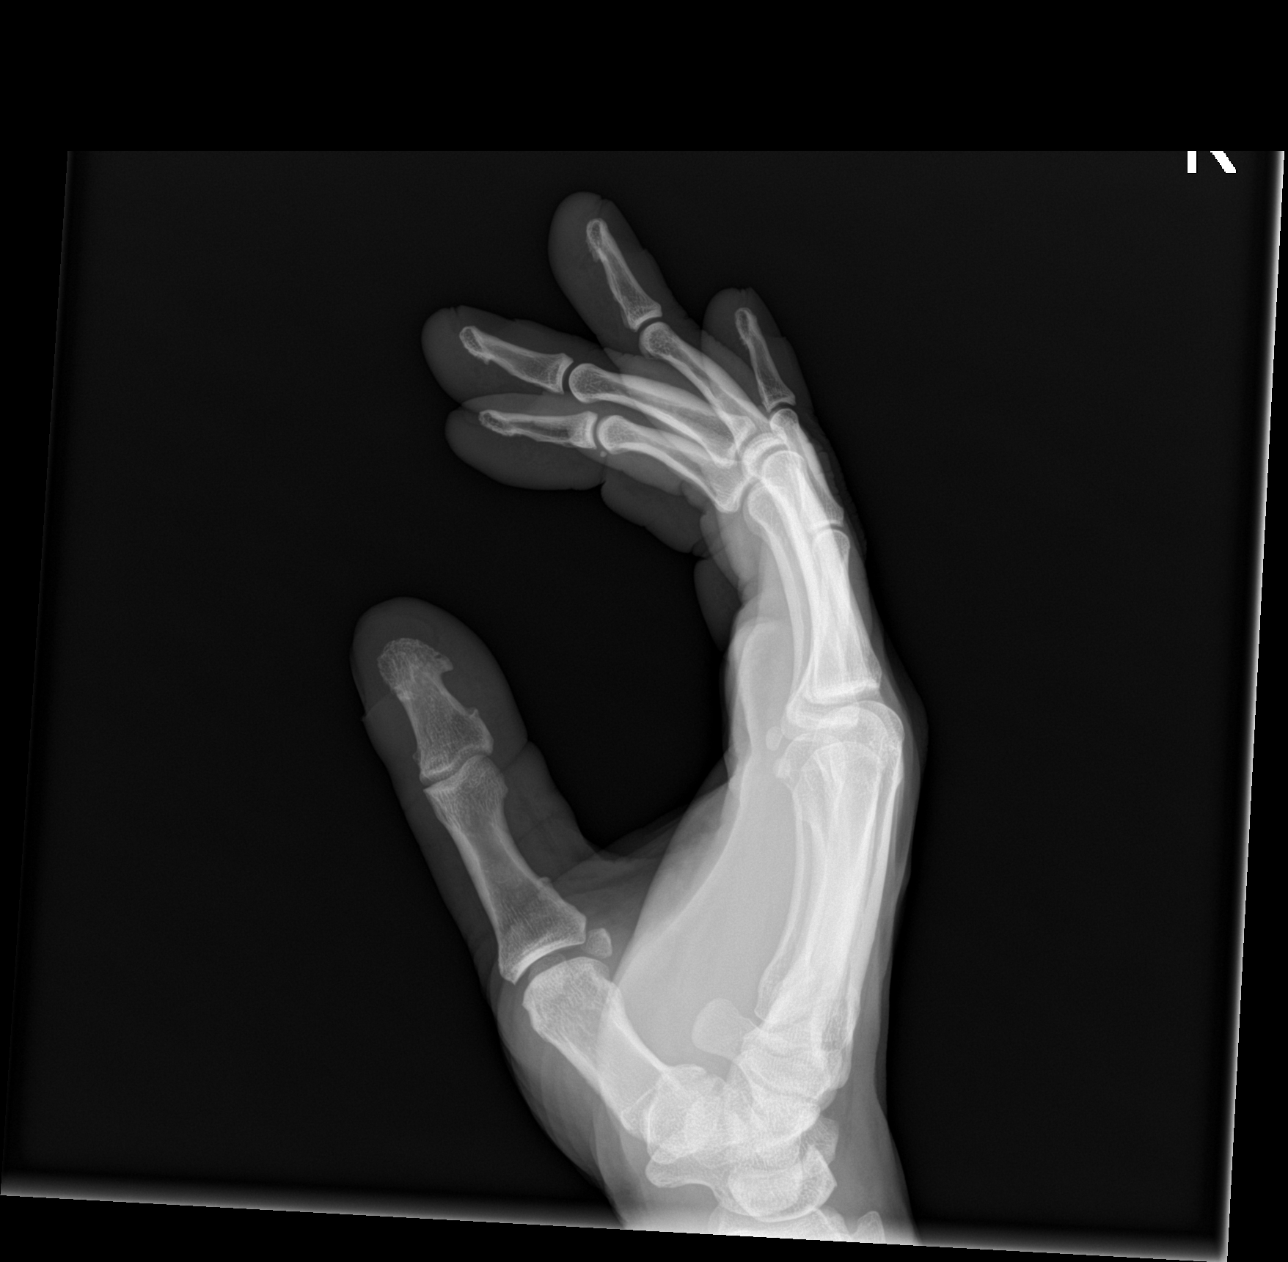

[3 of 3 positions shown; findings below may reference images not displayed]

FINDINGS: There is no evidence of fracture or dislocation. There is no
evidence of arthropathy or other focal bone abnormality. Soft tissue
edema about the thumb.
IMPRESSION: No fracture or dislocation of the right thumb.  Soft tissue edema.

## 2021-07-28 ENCOUNTER — Encounter: Payer: Self-pay | Admitting: *Deleted

## 2022-01-16 ENCOUNTER — Inpatient Hospital Stay (HOSPITAL_COMMUNITY)
Admission: EM | Admit: 2022-01-16 | Discharge: 2022-01-20 | DRG: 378 | Disposition: A | Discharge status: Home / Self Care | Payer: Self-pay | Attending: Family Medicine | Admitting: Family Medicine

## 2022-01-16 ENCOUNTER — Encounter (HOSPITAL_COMMUNITY): Payer: Self-pay | Admitting: Family Medicine

## 2022-01-16 ENCOUNTER — Other Ambulatory Visit: Payer: Self-pay

## 2022-01-16 ENCOUNTER — Observation Stay (HOSPITAL_COMMUNITY): Payer: Self-pay

## 2022-01-16 ENCOUNTER — Emergency Department (HOSPITAL_COMMUNITY): Payer: Self-pay

## 2022-01-16 DIAGNOSIS — D62 Acute posthemorrhagic anemia: Secondary | ICD-10-CM | POA: Diagnosis present

## 2022-01-16 DIAGNOSIS — F17210 Nicotine dependence, cigarettes, uncomplicated: Secondary | ICD-10-CM | POA: Diagnosis present

## 2022-01-16 DIAGNOSIS — K2981 Duodenitis with bleeding: Secondary | ICD-10-CM | POA: Diagnosis present

## 2022-01-16 DIAGNOSIS — K922 Gastrointestinal hemorrhage, unspecified: Secondary | ICD-10-CM | POA: Diagnosis present

## 2022-01-16 DIAGNOSIS — K3189 Other diseases of stomach and duodenum: Secondary | ICD-10-CM | POA: Diagnosis present

## 2022-01-16 DIAGNOSIS — K222 Esophageal obstruction: Secondary | ICD-10-CM | POA: Diagnosis present

## 2022-01-16 DIAGNOSIS — Z23 Encounter for immunization: Secondary | ICD-10-CM

## 2022-01-16 DIAGNOSIS — D500 Iron deficiency anemia secondary to blood loss (chronic): Secondary | ICD-10-CM

## 2022-01-16 DIAGNOSIS — K5730 Diverticulosis of large intestine without perforation or abscess without bleeding: Secondary | ICD-10-CM | POA: Diagnosis present

## 2022-01-16 DIAGNOSIS — Z885 Allergy status to narcotic agent status: Secondary | ICD-10-CM

## 2022-01-16 DIAGNOSIS — Z8673 Personal history of transient ischemic attack (TIA), and cerebral infarction without residual deficits: Secondary | ICD-10-CM

## 2022-01-16 DIAGNOSIS — R1031 Right lower quadrant pain: Secondary | ICD-10-CM

## 2022-01-16 DIAGNOSIS — F419 Anxiety disorder, unspecified: Secondary | ICD-10-CM | POA: Diagnosis present

## 2022-01-16 DIAGNOSIS — Z79899 Other long term (current) drug therapy: Secondary | ICD-10-CM

## 2022-01-16 DIAGNOSIS — I10 Essential (primary) hypertension: Secondary | ICD-10-CM | POA: Diagnosis present

## 2022-01-16 DIAGNOSIS — E785 Hyperlipidemia, unspecified: Secondary | ICD-10-CM | POA: Diagnosis present

## 2022-01-16 DIAGNOSIS — I700 Atherosclerosis of aorta: Secondary | ICD-10-CM | POA: Diagnosis present

## 2022-01-16 DIAGNOSIS — K449 Diaphragmatic hernia without obstruction or gangrene: Secondary | ICD-10-CM | POA: Diagnosis present

## 2022-01-16 DIAGNOSIS — Z823 Family history of stroke: Secondary | ICD-10-CM

## 2022-01-16 DIAGNOSIS — Z91030 Bee allergy status: Secondary | ICD-10-CM

## 2022-01-16 DIAGNOSIS — K269 Duodenal ulcer, unspecified as acute or chronic, without hemorrhage or perforation: Secondary | ICD-10-CM

## 2022-01-16 DIAGNOSIS — N400 Enlarged prostate without lower urinary tract symptoms: Secondary | ICD-10-CM | POA: Diagnosis present

## 2022-01-16 DIAGNOSIS — K264 Chronic or unspecified duodenal ulcer with hemorrhage: Principal | ICD-10-CM | POA: Diagnosis present

## 2022-01-16 DIAGNOSIS — Z801 Family history of malignant neoplasm of trachea, bronchus and lung: Secondary | ICD-10-CM

## 2022-01-16 HISTORY — DX: Iron deficiency anemia secondary to blood loss (chronic): D50.0

## 2022-01-16 LAB — TYPE AND SCREEN
ABO/RH(D): O POS
Antibody Screen: NEGATIVE

## 2022-01-16 LAB — POC OCCULT BLOOD, ED: Fecal Occult Bld: POSITIVE — AB

## 2022-01-16 LAB — COMPREHENSIVE METABOLIC PANEL
ALT: 16 U/L (ref 0–44)
AST: 20 U/L (ref 15–41)
Albumin: 3.5 g/dL (ref 3.5–5.0)
Alkaline Phosphatase: 54 U/L (ref 38–126)
Anion gap: 9 (ref 5–15)
BUN: 18 mg/dL (ref 6–20)
CO2: 20 mmol/L — ABNORMAL LOW (ref 22–32)
Calcium: 8.7 mg/dL — ABNORMAL LOW (ref 8.9–10.3)
Chloride: 111 mmol/L (ref 98–111)
Creatinine, Ser: 0.94 mg/dL (ref 0.61–1.24)
GFR, Estimated: >60 mL/min (ref >60)
Glucose, Bld: 145 mg/dL — ABNORMAL HIGH (ref 70–99)
Potassium: 3.5 mmol/L (ref 3.5–5.1)
Sodium: 140 mmol/L (ref 135–145)
Total Bilirubin: 0.5 mg/dL (ref 0.3–1.2)
Total Protein: 6.3 g/dL — ABNORMAL LOW (ref 6.5–8.1)

## 2022-01-16 LAB — APTT: aPTT: 34 seconds (ref 24–36)

## 2022-01-16 LAB — CBC
HCT: 40 % (ref 39.0–52.0)
Hemoglobin: 14 g/dL (ref 13.0–17.0)
MCH: 32.5 pg (ref 26.0–34.0)
MCHC: 35 g/dL (ref 30.0–36.0)
MCV: 92.8 fL (ref 80.0–100.0)
Platelets: 288 10*3/uL (ref 150–400)
RBC: 4.31 MIL/uL (ref 4.22–5.81)
RDW: 13.7 % (ref 11.5–15.5)
WBC: 8.5 10*3/uL (ref 4.0–10.5)
nRBC: 0 % (ref 0.0–0.2)

## 2022-01-16 LAB — HEMOGLOBIN AND HEMATOCRIT, BLOOD
HCT: 37.2 % — ABNORMAL LOW (ref 39.0–52.0)
HCT: 35.3 % — ABNORMAL LOW (ref 39.0–52.0)
Hemoglobin: 12.7 g/dL — ABNORMAL LOW (ref 13.0–17.0)
Hemoglobin: 12.4 g/dL — ABNORMAL LOW (ref 13.0–17.0)

## 2022-01-16 LAB — PROTIME-INR
INR: 1.1 (ref 0.8–1.2)
Prothrombin Time: 13.8 seconds (ref 11.4–15.2)

## 2022-01-16 LAB — ABO/RH: ABO/RH(D): O POS

## 2022-01-16 LAB — HIV ANTIBODY (ROUTINE TESTING W REFLEX): HIV Screen 4th Generation wRfx: NONREACTIVE

## 2022-01-16 LAB — LACTIC ACID, PLASMA: Lactic Acid, Venous: 0.9 mmol/L (ref 0.5–1.9)

## 2022-01-16 MED ORDER — ACETAMINOPHEN 650 MG RE SUPP: 650.0000 mg | Freq: Four times a day (QID) | RECTAL | Status: DC | PRN

## 2022-01-16 MED ORDER — ONDANSETRON HCL 4 MG/2ML IJ SOLN: 4.0000 mg | Freq: Once | INTRAMUSCULAR | Status: DC

## 2022-01-16 MED ORDER — IOHEXOL 350 MG/ML SOLN
100.0000 mL | Freq: Once | INTRAVENOUS | Status: AC | PRN
  Administered 2022-01-16: 100 mL via INTRAVENOUS

## 2022-01-16 MED ORDER — HYDROMORPHONE HCL 1 MG/ML IJ SOLN
0.5000 mg | Freq: Once | INTRAMUSCULAR | Status: AC
  Administered 2022-01-17: 0.5 mg via INTRAVENOUS
  Filled 2022-01-16: qty 0.5

## 2022-01-16 MED ORDER — ACETAMINOPHEN 325 MG PO TABS
650.0000 mg | ORAL_TABLET | Freq: Four times a day (QID) | ORAL | Status: DC | PRN
  Administered 2022-01-16 – 2022-01-19 (×2): 650 mg via ORAL
  Filled 2022-01-16: qty 2

## 2022-01-16 MED ORDER — ASPIRIN 81 MG PO TBEC: 81.0000 mg | DELAYED_RELEASE_TABLET | Freq: Every day | ORAL | Status: DC

## 2022-01-16 MED ORDER — IOHEXOL 350 MG/ML SOLN
75.0000 mL | Freq: Once | INTRAVENOUS | Status: AC | PRN
  Administered 2022-01-16: 75 mL via INTRAVENOUS

## 2022-01-16 MED ORDER — HYDROMORPHONE HCL 1 MG/ML IJ SOLN
1.0000 mg | Freq: Once | INTRAMUSCULAR | Status: AC
  Administered 2022-01-16: 1 mg via INTRAVENOUS
  Filled 2022-01-16: qty 1

## 2022-01-16 MED ORDER — AMLODIPINE BESYLATE 5 MG PO TABS
5.0000 mg | ORAL_TABLET | Freq: Every day | ORAL | Status: DC
  Administered 2022-01-16 – 2022-01-19 (×4): 5 mg via ORAL
  Filled 2022-01-16 (×4): qty 1

## 2022-01-16 MED ORDER — KETOROLAC TROMETHAMINE 30 MG/ML IJ SOLN
30.0000 mg | Freq: Once | INTRAMUSCULAR | Status: AC
  Administered 2022-01-16: 30 mg via INTRAVENOUS
  Filled 2022-01-16: qty 1

## 2022-01-16 MED ORDER — ACETAMINOPHEN 325 MG PO TABS
650.0000 mg | ORAL_TABLET | Freq: Once | ORAL | Status: AC
  Administered 2022-01-16: 650 mg via ORAL
  Filled 2022-01-16: qty 2

## 2022-01-16 MED ORDER — PANTOPRAZOLE SODIUM 40 MG IV SOLR
40.0000 mg | Freq: Two times a day (BID) | INTRAVENOUS | Status: DC
  Administered 2022-01-16 – 2022-01-17 (×2): 40 mg via INTRAVENOUS
  Filled 2022-01-16 (×2): qty 10

## 2022-01-16 MED ORDER — ATORVASTATIN CALCIUM 40 MG PO TABS
40.0000 mg | ORAL_TABLET | Freq: Every day | ORAL | Status: DC
  Administered 2022-01-16 – 2022-01-20 (×5): 40 mg via ORAL
  Filled 2022-01-16 (×5): qty 1

## 2022-01-16 MED ORDER — NICOTINE 14 MG/24HR TD PT24
14.0000 mg | MEDICATED_PATCH | Freq: Every day | TRANSDERMAL | Status: DC
  Administered 2022-01-16 – 2022-01-20 (×5): 14 mg via TRANSDERMAL
  Filled 2022-01-16 (×5): qty 1

## 2022-01-16 MED ORDER — MORPHINE SULFATE (PF) 4 MG/ML IV SOLN: 4.0000 mg | Freq: Once | INTRAVENOUS | Status: DC

## 2022-01-16 NOTE — Assessment & Plan Note (Addendum)
Hemoglobin uptrending: 12.8>13.1. On exam has tenderness mainly in the right lower quadrant of the abdomen.  He reports being on bowel prep regimen per GI, had a bloody bowel movement this morning.  CTA of abdomen shows possible fistula.  Unclear source of GI bleed at this time however still considering possible anal fissure or internal hemorrhoids precipitated by acute diarrheal illness.  GI consulted and plan for colonoscopy today. - GI consulted, Appreciate recs. - Tylenol as needed for pain - Oxycodone 5mg  Q6H PRN for abdominal pain - NPO for today's colonoscopy - Calprotectin pending - AM CBC - Transfusion threshold <7

## 2022-01-16 NOTE — Assessment & Plan Note (Addendum)
Chronic. BP this morning was 134/80. - Continue amlodipine 5mg  daily.  - Monitor with routine vitals

## 2022-01-16 NOTE — ED Notes (Signed)
ED TO INPATIENT HANDOFF REPORT  ED Nurse Name and Phone #:   S Name/Age/Gender Derek French 56 y.o. male Room/Bed: 025C/025C  Code Status   Code Status: Full Code  Home/SNF/Other Home Patient oriented to: self, place, time, and situation Is this baseline? Yes   Triage Complete: Triage complete  Chief Complaint GI bleed [K92.2]  Triage Note Patient here with complaint of intermittent right groin pain and rectal bleeding that started on Wednesday this week. Patient states he feels a pain for three to four minutes in his right groin, described as feeling "like I got kicked in the nuts". Then after those three to minutes patient feels the sudden need to have a bowel movement and states the stool is mixed with dark burgundy to bright red blood. Patient denies taking a blood thinner, denies pain now, is alert, oriented, and in no apparent distress at this time.   Allergies Allergies  Allergen Reactions   Bee Venom Anaphylaxis   Codeine Other (See Comments)    hallucinations     Level of Care/Admitting Diagnosis ED Disposition     ED Disposition  Admit   Condition  --   Comment  Hospital Area: [facility] [[id number]]  Level of Care: Med-Surg [16]  May place patient in observation at [facility] or [facility] if equivalent level of care is available:: No  Covid Evaluation: Asymptomatic - no recent exposure (last 10 days) testing not required  Diagnosis: GI bleed [161096]  Admitting Physician: Moses Manners [[id number]]  Attending Physician: Moses Manners [[id number]]          B Medical/Surgery History Past Medical History:  Diagnosis Date   Anemia due to GI blood loss 01/16/2022   Cocaine use    Headache(784.0)    Hypertension 05/03/2019   Recurrent boils    Stroke-like episode    07/2009, 03/2011, 09/2011   Past Surgical History:  Procedure Laterality Date   HERNIA REPAIR       A IV Location/Drains/Wounds Patient Lines/Drains/Airways Status      Active Line/Drains/Airways     Name Placement date Placement time Site Days   Peripheral IV 01/16/22 20 G Right Forearm 01/16/22  0922  Forearm  less than 1   Wound 10/08/11 Other (Comment) Axilla Right abscess with redness and warmth 10/08/11  1344  Axilla  3753   Wound / Incision (Open or Dehisced) 02/26/15 Incision - Open Axilla Left post I and D of abscess 02/26/15  --  Axilla  2516            Intake/Output Last 24 hours No intake or output data in the 24 hours ending 01/16/22 1924  Labs/Imaging Results for orders placed or performed during the hospital encounter of 01/16/22 (from the past 48 hour(s))  Comprehensive metabolic panel     Status: Abnormal   Collection Time: 01/16/22  8:18 AM  Result Value Ref Range   Sodium 140 135 - 145 mmol/L   Potassium 3.5 3.5 - 5.1 mmol/L   Chloride 111 98 - 111 mmol/L   CO2 20 (L) 22 - 32 mmol/L   Glucose, Bld 145 (H) 70 - 99 mg/dL    Comment: Glucose reference range applies only to samples taken after fasting for at least 8 hours.   BUN 18 6 - 20 mg/dL   Creatinine, Ser 0.45 0.61 - 1.24 mg/dL   Calcium 8.7 (L) 8.9 - 10.3 mg/dL   Total Protein 6.3 (L) 6.5 - 8.1 g/dL  Albumin 3.5 3.5 - 5.0 g/dL   AST 20 15 - 41 U/L   ALT 16 0 - 44 U/L   Alkaline Phosphatase 54 38 - 126 U/L   Total Bilirubin 0.5 0.3 - 1.2 mg/dL   GFR, Estimated >60 >60 mL/min    Comment: (NOTE) Calculated using the CKD-EPI Creatinine Equation (2021)    Anion gap 9 5 - 15    Comment: Performed at [facility], [address] [place], [place] [address]  CBC     Status: None   Collection Time: 01/16/22  8:18 AM  Result Value Ref Range   WBC 8.5 4.0 - 10.5 K/uL   RBC 4.31 4.22 - 5.81 MIL/uL   Hemoglobin 14.0 13.0 - 17.0 g/dL   HCT 40.0 39.0 - 52.0 %   MCV 92.8 80.0 - 100.0 fL   MCH 32.5 26.0 - 34.0 pg   MCHC 35.0 30.0 - 36.0 g/dL   RDW 13.7 11.5 - 15.5 %   Platelets 288 150 - 400 K/uL   nRBC 0.0 0.0 - 0.2 %    Comment: Performed at [facility], [address] [place], [place] [address]  Type and screen [facility]     Status: None   Collection Time: 01/16/22  8:18 AM  Result Value Ref Range   ABO/RH(D) O POS    Antibody Screen NEG    Sample Expiration      01/19/2022,2359 Performed at [facility], [address] [place], [place] [address]   POC occult blood, ED     Status: Abnormal   Collection Time: 01/16/22  9:10 AM  Result Value Ref Range   Fecal Occult Bld POSITIVE (A) NEGATIVE  Lactic acid, plasma     Status: None   Collection Time: 01/16/22  9:27 AM  Result Value Ref Range   Lactic Acid, Venous 0.9 0.5 - 1.9 mmol/L    Comment: Performed at [facility], [address] [place], [place] [address]  ABO/Rh     Status: None   Collection Time: 01/16/22  2:24 PM  Result Value Ref Range   ABO/RH(D)      O POS Performed at [facility], [address] [place], [place] [address]   Hemoglobin and hematocrit, blood     Status: Abnormal   Collection Time: 01/16/22  2:30 PM  Result Value Ref Range   Hemoglobin 12.7 (L) 13.0 - 17.0 g/dL   HCT 37.2 (L) 39.0 - 52.0 %    Comment: Performed at  Hospital Lab, [address] [place], [place] [address]   CT Abdomen Pelvis W Contrast  Result Date: 01/16/2022 CLINICAL DATA:  Abdominal pain. Complains of intermittent right groin pain and rectal bleeding which started on Wednesday of this week. EXAM: CT ABDOMEN AND PELVIS WITH CONTRAST TECHNIQUE: Multidetector CT imaging of the abdomen and pelvis was performed using the standard protocol following bolus administration of intravenous contrast. RADIATION DOSE REDUCTION: This exam was performed according to the departmental dose-optimization program which includes automated exposure control, adjustment of the mA and/or kV according to patient size and/or use of iterative reconstruction technique. CONTRAST:  51m3Nedra Hai([address] 36mc5Nedra Hai(33GalVonz8515 S. B50mr2Nedra Hai(667[place] Vonz9 S38m.7Nedra Hai25RocVonz51 East 80ml3Nedra Hai21WheatVonz976821mW6Nedra Hai35Ca64mV7Nedra Hai(978ArVonz6 Jocke71m 5Nedra Hai40CarVonz685mNedra H56mi3Nedra HaiGreat Von11m85Nedra Hai80FoVonz8885m63Nedra Hai61BroaVonz48m04Nedra Hai47Anon RVonz7076 E54ms4Nedra Hai61MuhlenbergVo16mz7Nedra Hai(609KaVonz9633 Ea18mt9Nedra HaiCaVonz659m6Nedra Hai27EVonz9867mG7Nedra Hai61StormVonz79m16Nedra Hai76LocVonz37m74Nedra Hai78GranVonz[address]OHEXOL 350 MG/ML SOLN COMPARISON:  11/18/2014 FINDINGS: Lower  chest: No pleural fluid or airspace disease. Scarring noted within both lung bases. Hepatobiliary: No focal liver abnormality is seen. No gallstones, gallbladder wall thickening, or biliary dilatation. Pancreas: Unremarkable. No pancreatic ductal dilatation or surrounding inflammatory changes. Spleen:  Normal in size without focal abnormality. Adrenals/Urinary Tract: Normal adrenal glands. No suspicious kidney mass, nephrolithiasis, or hydronephrosis. Urinary bladder appears within normal limits. Stomach/Bowel: Stomach appears normal. The appendix is visualized and is normal. No bowel wall thickening, inflammation or distension. Sigmoid diverticulosis without signs of acute diverticulitis. Similar to the previous exam there is a linear soft tissue density along the left gluteal fold, which measures 3.5 cm in length, image 87/6. This may reflect underlying perianal fistula. No fluid collection identified to suggest perianal abscess. Vascular/Lymphatic: Normal appearance of the abdominal aorta. Aortic atherosclerosis. No aneurysm. No signs of abdominopelvic adenopathy. Reproductive: Prostate gland enlargement. Other: No free fluid or fluid collections identified. No signs of pneumoperitoneum. Musculoskeletal: No acute or significant osseous findings. IMPRESSION: 1. No acute findings within the abdomen or pelvis. 2. Sigmoid diverticulosis without signs of acute diverticulitis. 3. Similar to the previous exam there is a linear soft tissue density along the left gluteal fold, which may reflect underlying perianal fistula. No fluid collection identified to suggest perianal abscess. 4. Prostate gland enlargement. 5.  Aortic Atherosclerosis (ICD10-I70.0). Electronically Signed   By: Signa Kell M.D.   On: 01/16/2022 11:02    Pending Labs Unresulted Labs (From admission, onward)     Start     Ordered   01/17/22 0500  Basic metabolic panel  Tomorrow morning,   R        01/16/22 1736   01/17/22 0500  CBC  Tomorrow  morning,   R        01/16/22 1736   01/17/22 0500  Lipid panel  Tomorrow morning,   R        01/16/22 1736   01/17/22 0500  Hemoglobin A1c  Tomorrow morning,   R        01/16/22 1736   01/16/22 1847  Hemoglobin and hematocrit, blood  Now then every 6 hours,   R (with TIMED occurrences)      01/16/22 1846   01/16/22 1734  Protime-INR  Once,   R        01/16/22 1736   01/16/22 1734  APTT  Once,   R        01/16/22 1736   01/16/22 1731  HIV Antibody (routine testing w rflx)  (HIV Antibody (Routine testing w reflex) panel)  Once,   R        01/16/22 1736            Vitals/Pain Today's Vitals   01/16/22 1345 01/16/22 1432 01/16/22 1440 01/16/22 1818  BP: (!) 153/90     Pulse: (!) 57     Resp: 15     Temp:  98.1 F (36.7 C)  98 F (36.7 C)  TempSrc:  Oral  Oral  SpO2: 97%     PainSc:   10-Worst pain ever     Isolation Precautions No active isolations  Medications Medications  amLODipine (NORVASC) tablet 5 mg (has no administration in time range)  atorvastatin (LIPITOR) tablet 40 mg (has no administration in time range)  nicotine (NICODERM CQ - dosed in mg/24 hours) patch 14 mg (has no administration in time range)  acetaminophen (TYLENOL) tablet 650 mg (has no administration in time range)    Or  acetaminophen (TYLENOL) suppository 650 mg (has no administration in time range)  pantoprazole (PROTONIX) injection 40 mg (has no administration in time range)  ketorolac (TORADOL) 30 MG/ML injection 30 mg (30 mg Intravenous Given 01/16/22 0925)  HYDROmorphone (DILAUDID) injection 1 mg (1  mg Intravenous Given 01/16/22 1042)  iohexol (OMNIPAQUE) 350 MG/ML injection 75 mL (75 mLs Intravenous Contrast Given 01/16/22 1044)  acetaminophen (TYLENOL) tablet 650 mg (650 mg Oral Given 01/16/22 1440)    Mobility walks Low fall risk   Focused Assessments    R Recommendations: See Admitting Provider Note  Report given to:   Additional Notes:

## 2022-01-16 NOTE — ED Provider Notes (Signed)
56 year old male with complaint of bright red blood per rectum.  Patient was evaluated in the emergency room, did have a slight drop in his H&H which prompted consult to GI who requested admission with GI to see in the morning.  Patient is not anticoagulated, he is vitally stable.  Regarding his CT scan with diverticuli without diverticulitis and possible fistula, did not require further treatment at this time. Physical Exam  BP (!) 153/90   Pulse (!) 57   Temp 98.1 F (36.7 C) (Oral)   Resp 15   SpO2 97%   Physical Exam  Procedures  Procedures  ED Course / MDM    Medical Decision Making Amount and/or Complexity of Data Reviewed Labs: ordered. Radiology: ordered.  Risk OTC drugs. Prescription drug management.   Case discussed with Dr. Chipper Herb with family medicine service who will consult for admission.  Secure chat sent to Dr. Myrtie Neither updating plan and admission and requesting GI team to follow.       Jeannie Fend, PA-C 01/16/22 1651    Glendora Score, MD 01/17/22 [phone]

## 2022-01-16 NOTE — Assessment & Plan Note (Addendum)
LDL 98 12/2021. - continue atorvastatin 40mg  daily.

## 2022-01-16 NOTE — ED Notes (Signed)
Pt is requesting pain meds other than tylenol. Tylenol given previously with no relief. MD messaged at this time.

## 2022-01-16 NOTE — Progress Notes (Signed)
FMTS Brief Progress Note  S:Seen on evening rounds. Notified by RN that patient was having a great deal of pain after eating dinner and was requesting pain medicine. Patient reports no more bloody BM since this morning but is having terrible pain R>L after eating.    O: BP (!) 153/90   Pulse (!) 57   Temp 98 F (36.7 C) (Oral)   Resp 15   SpO2 97%   Gen: Uncomfortable, not in extremis Cardio: RRR, no m/r/g Pulm: Normal WOB on RA, intermittently tachypneic Abd: Diffusely tender with ++guarding, mildly distended  A/P: Given history of stroke and new abdominal pain with maroon stool, I am concerned for possible mesenteric ischemia.  - Dilaudid 1mg  x1 - CTA abd/pelvis STAT - Place x2 large bore IV - Continue q6h H&H - NPO at MN - GI to evaluate in am  , MD 01/16/2022, 7:45 PM PGY-2, [facility] Night Resident  Please page [phone] with questions.

## 2022-01-16 NOTE — H&P (Cosign Needed Addendum)
Hospital Admission History and Physical Service Pager: [phone]  Patient name: Derek French Medical record number: [id number] Date of Birth: Mar 03, 1965 Age: 56 y.o. Gender: male  Primary Care Provider: Colletta Maryland, MD Consultants: GI Code Status: Full  Preferred Emergency Contact:  Murriel Becken (Mom) [phone]   Chief Complaint: Blood in stool  Assessment and Plan: Derek French is a 56 y.o. male presenting with blood in stool (melena vs hematochezia) .   * Acute GI bleeding Pt presents with 4-day history of intermittent right lower abdominal pain and bloody diarrhea (mix of red, purple, and black) c/f melena vs hematochezia vs both. Pain is improved with BM's. Denies N/V, fevers, rectal pain, hemorrhoids, heartburn. No recent travel or sick contact. No diet change. Has hx of bloody stool in 2016 for inguinal hernia (now s/p repair). VSS (except for HTN which is likely chronic). Exam notable for RLQ tenderness, no peritoneal signs. Hgb initially wnl, then slightly downtrended on repeat. CT notable for sigmoid diverticulosis and potential perianal fistula. FOBT positive. Differential for this patient's presentation is broad and includes bleeding from IBD, diverticulum, perianal fistula, colon polyps/cancer, infectious diarrhea, gastric/duodenal ulcer, internal hemorrhoids. IBD is considered given bloody diarrhea. Diverticular and fistula bleeding are considered due to imaging findings, but they do not appear acutely inflamed and anatomical location does not fit the location of pain. Colon polyp/cancer is considered given pt age, smoking risk factor, and never having a colonoscopy, but would not expect such acute onset of symptoms. Infectious diarrhea is considered but less likely given hx is low concern for infection exposure, afebrile, and no leukocytosis. Gastric/duodenal ulcer is considered given presence of melena suggests upper GI bleed. Hemorrhoids are not expected to cause  diarrhea. GI was consulted and plan to eval pt tomorrow. - Admit to [facility] med surg with attending Dr. Andria Frames - GI consulted, plan to eval tomorrow. Appreciate recs. - Tylenol as needed for pain - Start IV protonix 40mg  BID - NPO @ MN for potential GI procedure - Vitals per floor protocol - H&H q6h - AM CBC, CMP - f/u PT/INR, aPTT - Transfusion threshold <7  HTN (hypertension) Chronic and pt is not taking meds. Was previously on amlodipine 5mg .  - Restart amlo 5mg  daily. Uptitrate as needed.  Cigarette smoker Declines NRT at this time. - Encourage cessation - Consider outpt CT lung cancer screen  Dyslipidemia Has hx of CVA. Appears to have previously been on statin, but pt is not currently taking. - Restart atorvastatin 40mg  daily. Uptitrate as tolerated for secondary prevention. - f/u lipid, A1c - Holding ASA given GI bleed   FEN/GI: CLD until MN, then NPO for potential GI procedure VTE Prophylaxis: SCD's. No lovenox given bleed risk  Disposition: Med surge  History of Present Illness:  Derek French is a 56 y.o. male presenting with bloody stool.  Since Wednesday, patient reports having intermittent episodes of right lower abdominal pain that feels like he was "kicked in the private parts".  Pain comes in waves, feels like cramping, similar to how he feels when he needs to have a BM.  Pain will last 2-3 minutes, go away for 10 minutes, then come back.  Having a BM improves the pain for a little bit.  He also notes that his bowel movements have been loose and bloody, enough to turn the toilet bowl red.  The stool is described as a mix of red, purple, some black.  He has been eating less to avoid  having Bms.  Denies nausea, vomiting, fever, chills, rectal pain, history of hemorrhoids, heartburn.  Denies chest pain, shortness of breath. Only had 1 BM today.  No recent hospitalizations or antibiotic use. He previously had bloody stools back in 2016 due to a hernia that is now repaired.   Has never had a colonoscopy or EGD.  In the ED, VSS except for hypertension.  Hemoglobin with initially normal then dropped slightly on repeat.  CT AP showed sigmoid diverticulosis and potential perianal fistula.  Received Tylenol, Dilaudid, Toradol for pain. GI was consulted and plan to evaluate tomorrow.  Patient was admitted to [facility].  Pertinent Past Medical History: Hernia HTN CVA  Dyslipidemia   Pertinent Past Surgical History: Hernia repair   Remainder reviewed in history tab.  Pertinent Social History: Tobacco use: Yes (8 cigarette a day for about 20 years)  Alcohol use: socially  Other Substance use: Marijuana and previously cocaine over a year Lives with Mother (he is her caregiver)  Pertinent Family History: Heart Disease, MI (Dad) HTN (Dad) Lung cancer (mom)  Important Outpatient Medications: Low dose aspirin (not taking)   Objective: BP (!) 153/90   Pulse (!) 57   Temp 98 F (36.7 C) (Oral)   Resp 15   SpO2 97%  Exam: General: Pleasant, alert, laying comfortably in bed.  NAD HEENT: NCAT. MMM. Cardiovascular: RRR, no murmurs. 2+ radial pulse Respiratory: CTAB. Normal WOB on RA Gastrointestinal: Tender to palpation in RLQ.  No guarding, rigidity, rebound.  Normal BS.  Neuro: Strength 5/5 and symmetric in LE's Ext: No leg edema  Labs:  CBC BMET  Recent Labs  Lab 01/16/22 0818 01/16/22 1430  WBC 8.5  --   HGB 14.0 12.7*  HCT 40.0 37.2*  PLT 288  --    Recent Labs  Lab 01/16/22 0818  NA 140  K 3.5  CL 111  CO2 20*  BUN 18  CREATININE 0.94  GLUCOSE 145*  CALCIUM 8.7*        Imaging Studies Performed:  CTAP 1. No acute findings within the abdomen or pelvis.  2. Sigmoid diverticulosis without signs of acute diverticulitis.  3. Similar to the previous exam there is a linear soft tissue  density along the left gluteal fold, which may reflect underlying  perianal fistula. No fluid collection identified to suggest perianal  abscess.  4.  Prostate gland enlargement.  5.  Aortic Atherosclerosis (ICD10-I70.0).    Lincoln Brigham, MD 01/16/2022, 6:47 PM PGY-1, [facility]  I was personally present and re-performed the exam. I verified the service and findings are accurately documented in the intern's/student's note.  Jerre Simon, MD 01/16/2022  7:17 PM   FPTS Intern pager: [phone], text pages welcome Secure chat group [facility] Teaching Service

## 2022-01-16 NOTE — ED Provider Notes (Signed)
[facility] EMERGENCY DEPARTMENT Provider Note   CSN: [id number] Arrival date & time: 01/16/22  0805     History  Chief Complaint  Patient presents with   Rectal Bleeding   HPI Derek French is a 56 y.o. male with history of CVA, hypertension, dyslipidemia, and cigarette smoking presenting for rectal bleeding.  Bleeding started 2 days ago.  States that every time he had a bowel movement it was all blood.  Blood was dark, burgundy, and black at times.  States it was a copious amount.  Denies excessive use of NSAIDs, salicylates and not currently on blood thinner.  Also mentioned that he has had some right growing pain which extends down into his testicle.  This has been going on for about a month.  The pain is intermittent.  States he feels like he is "gotten kicked in the nuts".  The pain usually last 2 to 3 minutes and goes away completely.  Denies syncope, lightheadedness or shortness of breath.  States he last used cocaine about a month ago.   Rectal Bleeding      Home Medications Prior to Admission medications   Medication Sig Start Date End Date Taking? Authorizing Provider  naproxen sodium (ALEVE) 220 MG tablet Take 220 mg by mouth daily as needed (pain).   Yes [provider]  amLODipine (NORVASC) 5 MG tablet Take 1 tablet (5 mg total) by mouth at bedtime. Patient not taking: Reported on 01/16/2022 05/02/19   Autry-Lott, Randa Evens, DO  aspirin EC 81 MG tablet Take 1 tablet (81 mg total) by mouth daily. Patient not taking: Reported on 01/16/2022 05/16/19   Autry-Lott, Randa Evens, DO  atorvastatin (LIPITOR) 40 MG tablet Take 1 tablet (40 mg total) by mouth daily. Patient not taking: Reported on 01/16/2022 05/16/19   Autry-Lott, Randa Evens, DO  nicotine (NICODERM CQ - DOSED IN MG/24 HOURS) 14 mg/24hr patch Place 1 patch (14 mg total) onto the skin daily. Use a new 14 mg patch every morning for 6 weeks. Then use new 7 mg patches every morning for 2 weeks Patient  not taking: Reported on 01/16/2022 05/16/19   Autry-Lott, Randa Evens, DO      Allergies    Bee venom and Codeine    Review of Systems   Review of Systems  Gastrointestinal:  Positive for hematochezia.  Genitourinary:        Right groin pain and right testicular pain    Physical Exam Updated Vital Signs BP (!) 156/85 (BP Location: Right Arm)   Pulse 64   Temp 97.9 F (36.6 C) (Oral)   Resp 16   Ht 6\' 3"  (1.905 m) Comment: Pt stated  Wt 87.5 kg   SpO2 99%   BMI 24.11 kg/m  Physical Exam Vitals and nursing note reviewed.  HENT:     Head: Normocephalic and atraumatic.     Mouth/Throat:     Mouth: Mucous membranes are moist.  Eyes:     General:        Right eye: No discharge.        Left eye: No discharge.     Conjunctiva/sclera: Conjunctivae normal.  Cardiovascular:     Rate and Rhythm: Normal rate and regular rhythm.     Pulses: Normal pulses.     Heart sounds: Normal heart sounds.  Pulmonary:     Effort: Pulmonary effort is normal.     Breath sounds: Normal breath sounds.  Abdominal:     General: Abdomen is flat.  Palpations: Abdomen is soft.     Tenderness: There is abdominal tenderness in the right lower quadrant, suprapubic area and left lower quadrant.     Hernia: There is no hernia in the left inguinal area or right inguinal area.  Genitourinary:    Testes: Normal.        Right: Mass, tenderness or swelling not present.        Left: Mass, tenderness or swelling not present.     Epididymis:     Right: Normal.     Left: Normal.     Prostate: Not enlarged and not tender.     Rectum: Guaiac result positive. Tenderness present. No mass, anal fissure, external hemorrhoid or internal hemorrhoid.  Skin:    General: Skin is warm and dry.  Neurological:     General: No focal deficit present.  Psychiatric:        Mood and Affect: Mood normal.     ED Results / Procedures / Treatments   Labs (all labs ordered are listed, but only abnormal results are  displayed) Labs Reviewed  COMPREHENSIVE METABOLIC PANEL - Abnormal; Notable for the following components:      Result Value   CO2 20 (*)    Glucose, Bld 145 (*)    Calcium 8.7 (*)    Total Protein 6.3 (*)    All other components within normal limits  HEMOGLOBIN AND HEMATOCRIT, BLOOD - Abnormal; Notable for the following components:   Hemoglobin 12.7 (*)    HCT 37.2 (*)    All other components within normal limits  LIPID PANEL - Abnormal; Notable for the following components:   HDL 35 (*)    All other components within normal limits  HEMOGLOBIN AND HEMATOCRIT, BLOOD - Abnormal; Notable for the following components:   Hemoglobin 12.4 (*)    HCT 35.3 (*)    All other components within normal limits  HEMOGLOBIN AND HEMATOCRIT, BLOOD - Abnormal; Notable for the following components:   Hemoglobin 12.1 (*)    HCT 34.5 (*)    All other components within normal limits  POC OCCULT BLOOD, ED - Abnormal; Notable for the following components:   Fecal Occult Bld POSITIVE (*)    All other components within normal limits  CBC  LACTIC ACID, PLASMA  HIV ANTIBODY (ROUTINE TESTING W REFLEX)  PROTIME-INR  APTT  BASIC METABOLIC PANEL  CBC  HEMOGLOBIN A1C  HEMOGLOBIN AND HEMATOCRIT, BLOOD  HEMOGLOBIN AND HEMATOCRIT, BLOOD  TYPE AND SCREEN  ABO/RH    EKG None  Radiology CT Angio Abd/Pel w/ and/or w/o  Result Date: 01/16/2022 CLINICAL DATA:  Lower GI bleed; rule out mesenteric ischemia EXAM: CTA ABDOMEN AND PELVIS WITHOUT AND WITH CONTRAST TECHNIQUE: Multidetector CT imaging of the abdomen and pelvis was performed using the standard protocol during bolus administration of intravenous contrast. Multiplanar reconstructed images and MIPs were obtained and reviewed to evaluate the vascular anatomy. RADIATION DOSE REDUCTION: This exam was performed according to the departmental dose-optimization program which includes automated exposure control, adjustment of the mA and/or kV according to patient  size and/or use of iterative reconstruction technique. CONTRAST:  141mL OMNIPAQUE IOHEXOL 350 MG/ML SOLN COMPARISON:  CT abdomen and pelvis earlier today FINDINGS: VASCULAR Aorta: Normal caliber aorta without aneurysm, dissection, vasculitis or significant stenosis. Mild aortic atherosclerosis. Celiac: Patent without evidence of aneurysm, dissection, vasculitis or significant stenosis. SMA: Patent without evidence of aneurysm, dissection, vasculitis or significant stenosis. Renals: Both renal arteries are patent without evidence of aneurysm, dissection, vasculitis,  fibromuscular dysplasia or significant stenosis. IMA: Patent without evidence of aneurysm, dissection, vasculitis or significant stenosis. Inflow: Patent without evidence of aneurysm, dissection, vasculitis or significant stenosis. Proximal Outflow: Bilateral common femoral and visualized portions of the superficial and profunda femoral arteries are patent without evidence of aneurysm, dissection, vasculitis or significant stenosis. Veins: Patent portal vein. Review of the MIP images confirms the above findings. NON-VASCULAR Lower chest: No acute abnormality.  Bibasilar scarring. Hepatobiliary: No focal liver abnormality is seen. No gallstones, gallbladder wall thickening, or biliary dilatation. Pancreas: Unremarkable. No pancreatic ductal dilatation or surrounding inflammatory changes. Spleen: Normal in size without focal abnormality. Adrenals/Urinary Tract: Adrenal glands are unremarkable. Kidneys are normal, without renal calculi, focal lesion, or hydronephrosis. Contrast within the bladder from prior CT scan. Stomach/Bowel: Stomach is within normal limits. Appendix appears normal. No evidence of bowel wall thickening, distention, or inflammatory changes. No evidence of active GI bleeding or mesenteric ischemia. Mild colonic diverticulosis without diverticulitis. Redemonstrated linear soft tissue density along the left gluteal fold measuring 3.5 cm in  length. This may reflect underlying perianal fistula. No fluid collection to suggest abscess. Lymphatic: No lymphadenopathy. Reproductive: Mild prostate enlargement. Other: No free intraperitoneal fluid or air. Musculoskeletal: No acute osseous abnormality. IMPRESSION: VASCULAR No acute abnormality. No evidence of active GI bleed or mesenteric ischemia. NON-VASCULAR Linear soft tissue density along the left gluteal fold is redemonstrated and may reflect a perianal fistula. No evidence of abscess. Colonic diverticulosis without diverticulitis. Prostatomegaly. Aortic Atherosclerosis (ICD10-I70.0). Electronically Signed   By: Placido Sou M.D.   On: 01/16/2022 22:28   CT Abdomen Pelvis W Contrast  Result Date: 01/16/2022 CLINICAL DATA:  Abdominal pain. Complains of intermittent right groin pain and rectal bleeding which started on Wednesday of this week. EXAM: CT ABDOMEN AND PELVIS WITH CONTRAST TECHNIQUE: Multidetector CT imaging of the abdomen and pelvis was performed using the standard protocol following bolus administration of intravenous contrast. RADIATION DOSE REDUCTION: This exam was performed according to the departmental dose-optimization program which includes automated exposure control, adjustment of the mA and/or kV according to patient size and/or use of iterative reconstruction technique. CONTRAST:  33mL OMNIPAQUE IOHEXOL 350 MG/ML SOLN COMPARISON:  11/18/2014 FINDINGS: Lower chest: No pleural fluid or airspace disease. Scarring noted within both lung bases. Hepatobiliary: No focal liver abnormality is seen. No gallstones, gallbladder wall thickening, or biliary dilatation. Pancreas: Unremarkable. No pancreatic ductal dilatation or surrounding inflammatory changes. Spleen: Normal in size without focal abnormality. Adrenals/Urinary Tract: Normal adrenal glands. No suspicious kidney mass, nephrolithiasis, or hydronephrosis. Urinary bladder appears within normal limits. Stomach/Bowel: Stomach  appears normal. The appendix is visualized and is normal. No bowel wall thickening, inflammation or distension. Sigmoid diverticulosis without signs of acute diverticulitis. Similar to the previous exam there is a linear soft tissue density along the left gluteal fold, which measures 3.5 cm in length, image 87/6. This may reflect underlying perianal fistula. No fluid collection identified to suggest perianal abscess. Vascular/Lymphatic: Normal appearance of the abdominal aorta. Aortic atherosclerosis. No aneurysm. No signs of abdominopelvic adenopathy. Reproductive: Prostate gland enlargement. Other: No free fluid or fluid collections identified. No signs of pneumoperitoneum. Musculoskeletal: No acute or significant osseous findings. IMPRESSION: 1. No acute findings within the abdomen or pelvis. 2. Sigmoid diverticulosis without signs of acute diverticulitis. 3. Similar to the previous exam there is a linear soft tissue density along the left gluteal fold, which may reflect underlying perianal fistula. No fluid collection identified to suggest perianal abscess. 4. Prostate gland enlargement. 5.  Aortic  Atherosclerosis (ICD10-I70.0). Electronically Signed   By: Kerby Moors M.D.   On: 01/16/2022 11:02    Procedures Procedures    Medications Ordered in ED Medications  amLODipine (NORVASC) tablet 5 mg (5 mg Oral Given 01/16/22 2228)  atorvastatin (LIPITOR) tablet 40 mg (40 mg Oral Given 01/16/22 1937)  nicotine (NICODERM CQ - dosed in mg/24 hours) patch 14 mg (14 mg Transdermal Patch Applied 01/16/22 1937)  acetaminophen (TYLENOL) tablet 650 mg (650 mg Oral Given 01/16/22 2228)    Or  acetaminophen (TYLENOL) suppository 650 mg ( Rectal See Alternative 01/16/22 2228)  pantoprazole (PROTONIX) injection 40 mg (40 mg Intravenous Given 01/16/22 2231)  oxyCODONE (Oxy IR/ROXICODONE) immediate release tablet 5 mg (has no administration in time range)  ketorolac (TORADOL) 30 MG/ML injection 30 mg (30 mg  Intravenous Given 01/16/22 0925)  HYDROmorphone (DILAUDID) injection 1 mg (1 mg Intravenous Given 01/16/22 1042)  iohexol (OMNIPAQUE) 350 MG/ML injection 75 mL (75 mLs Intravenous Contrast Given 01/16/22 1044)  acetaminophen (TYLENOL) tablet 650 mg (650 mg Oral Given 01/16/22 1440)  HYDROmorphone (DILAUDID) injection 1 mg (1 mg Intravenous Given 01/16/22 1937)  iohexol (OMNIPAQUE) 350 MG/ML injection 100 mL (100 mLs Intravenous Contrast Given 01/16/22 2206)  HYDROmorphone (DILAUDID) injection 0.5 mg (0.5 mg Intravenous Given 01/17/22 0033)    ED Course/ Medical Decision Making/ A&P                           Medical Decision Making Amount and/or Complexity of Data Reviewed Labs: ordered. Radiology: ordered.  Risk OTC drugs. Prescription drug management. Decision regarding hospitalization.   This patient presents to the ED for concern of rectal bleeding, this involves a number of treatment options, and is a complaint that carries with it a high risk of complications and morbidity.  The differential diagnosis includes hemorrhoids, colitis, appendicitis, upper GI bleed, and inguina hernia.    Co morbidities: Discussed in HPI     EMR reviewed including pt PMHx, past surgical history and past visits to ER.   See HPI for more details   Lab Tests:   I independently reviewed and interpreted labs. Labs notable for anemia, Hemoccult positive and hyperglycemia   Imaging Studies:  Abnormal findings. I personally reviewed all imaging studies. Imaging notable for enlarged prostate and sigmoid diverticulosis    Cardiac Monitoring:  The patient was maintained on a cardiac monitor.  I personally viewed and interpreted the cardiac monitored which showed an underlying rhythm of: NSR NA   Medicines ordered:  I ordered medication including Dilaudid Tylenol and Toradol for pain. Reevaluation of the patient after these medicines showed that the patient improved I have reviewed the  patients home medicines and have made adjustments as needed     Consults/Attending Physician   I requested consultation with GI,  and discussed lab and imaging findings as well as pertinent plan - they recommend: Advised admission to the hospital for further evaluation for ongoing GI bleed   Reevaluation:  After the interventions noted above I re-evaluated patient and found that they have :improved    Problem List / ED Course: Patient presents for rectal bleeding.  Exam did reveal bright red blood per rectum positive Hemoccult.  Initial concern is symptomatic anemia but unlikely given normal heart rate, patient denies SOB and lightheadedness and hemoglobin is 14.  Did consider hemorrhoids but unlikely given the findings on exam.  Decided to observe patient for couple hours and recheck his hemoglobin which was  12.1.  Given the patient has an active GI bleed and hemoglobin trending downward consulted GI who advised to admit for further evaluation.   Dispostion:  After consideration of the diagnostic results and the patients response to treatment, I feel that the patent would benefit from admission to the hospital for evaluation and management of ongoing GI bleed.         Final Clinical Impression(s) / ED Diagnoses Final diagnoses:  Acute GI bleeding  Anemia due to GI blood loss    Rx / DC Orders ED Discharge Orders     None         Harriet Pho, PA-C 01/17/22 [id number]    Malvin Johns, MD 01/19/22 1015

## 2022-01-16 NOTE — Hospital Course (Addendum)
Derek French is a 56 year old male presenting with bloody stool. Pertinent PMH/PSH includes dyslipidemia, CVA, HTN, tobacco use.  His hospital course is outlined below:    Acute GI Bleed:  On presentation hgb dropped from 14>12.1. Exam positive for RLQ abdominal tenderness. CTA abdomen ruled out mesenteric ischemia but shows possible fistula. GI was consulted and preformed a colonoscopy 11/27 which showed blood in the entire examined colon and in the terminal ileum.  There was no explanation for patient's symptoms on colonoscopy so EGD was completed which showed erythematous duodenopathy and a nonbleeding duodenal ulcer. His hemoglobin at discharge was 12.3. He was prescribed protonix to take outpatient for 8 total weeks.   Hypertension Chronic and stable throughout admission. Home Amlodipine 5 mg daily continued.   PCP Follow-up: 1) Restarted statin 2) Restarted BP med 3) Consider CT for lung cancer screening 4) Encourage smoking cessation as willing. 5) Colonoscopy recommended in 3 years (12/2024) 6) Patient not taking aspirin initially, please discuss with the patient.  7) Needs CBC at hospital follow up appointment

## 2022-01-16 NOTE — Assessment & Plan Note (Addendum)
Declines nicotine replacement therapy at this time. - Encourage cessation - Consider outpt CT lung cancer screen

## 2022-01-16 NOTE — ED Triage Notes (Addendum)
Patient here with complaint of intermittent right groin pain and rectal bleeding that started on Wednesday this week. Patient states he feels a pain for three to four minutes in his right groin, described as feeling "like I got kicked in the nuts". Then after those three to minutes patient feels the sudden need to have a bowel movement and states the stool is mixed with dark burgundy to bright red blood. Patient denies taking a blood thinner, denies pain now, is alert, oriented, and in no apparent distress at this time.

## 2022-01-17 DIAGNOSIS — R933 Abnormal findings on diagnostic imaging of other parts of digestive tract: Secondary | ICD-10-CM

## 2022-01-17 DIAGNOSIS — K921 Melena: Secondary | ICD-10-CM

## 2022-01-17 DIAGNOSIS — R1031 Right lower quadrant pain: Secondary | ICD-10-CM

## 2022-01-17 LAB — BASIC METABOLIC PANEL
Anion gap: 5 (ref 5–15)
BUN: 8 mg/dL (ref 6–20)
CO2: 23 mmol/L (ref 22–32)
Calcium: 8.4 mg/dL — ABNORMAL LOW (ref 8.9–10.3)
Chloride: 109 mmol/L (ref 98–111)
Creatinine, Ser: 0.85 mg/dL (ref 0.61–1.24)
GFR, Estimated: >60 mL/min (ref >60)
Glucose, Bld: 97 mg/dL (ref 70–99)
Potassium: 3.6 mmol/L (ref 3.5–5.1)
Sodium: 137 mmol/L (ref 135–145)

## 2022-01-17 LAB — LIPID PANEL
Cholesterol: 144 mg/dL (ref 0–200)
HDL: 35 mg/dL — ABNORMAL LOW (ref >40)
LDL Cholesterol: 98 mg/dL (ref 0–99)
Total CHOL/HDL Ratio: 4.1 RATIO
Triglycerides: 53 mg/dL (ref <150)
VLDL: 11 mg/dL (ref 0–40)

## 2022-01-17 LAB — CBC
HCT: 36.4 % — ABNORMAL LOW (ref 39.0–52.0)
Hemoglobin: 12.8 g/dL — ABNORMAL LOW (ref 13.0–17.0)
MCH: 32.4 pg (ref 26.0–34.0)
MCHC: 35.2 g/dL (ref 30.0–36.0)
MCV: 92.2 fL (ref 80.0–100.0)
Platelets: 252 10*3/uL (ref 150–400)
RBC: 3.95 MIL/uL — ABNORMAL LOW (ref 4.22–5.81)
RDW: 13.4 % (ref 11.5–15.5)
WBC: 6.9 10*3/uL (ref 4.0–10.5)
nRBC: 0 % (ref 0.0–0.2)

## 2022-01-17 LAB — SEDIMENTATION RATE: Sed Rate: 5 mm/hr (ref 0–16)

## 2022-01-17 LAB — HEMOGLOBIN AND HEMATOCRIT, BLOOD
HCT: 34.5 % — ABNORMAL LOW (ref 39.0–52.0)
Hemoglobin: 12.1 g/dL — ABNORMAL LOW (ref 13.0–17.0)

## 2022-01-17 LAB — C-REACTIVE PROTEIN: CRP: <0.5 mg/dL (ref <1.0)

## 2022-01-17 MED ORDER — ORAL CARE MOUTH RINSE: 15.0000 mL | OROMUCOSAL | Status: DC | PRN

## 2022-01-17 MED ORDER — POLYETHYLENE GLYCOL 3350 17 GM/SCOOP PO POWD
1.0000 | Freq: Once | ORAL | Status: AC
  Administered 2022-01-17: 255 g via ORAL
  Filled 2022-01-17: qty 255

## 2022-01-17 MED ORDER — OXYCODONE HCL 5 MG PO TABS
5.0000 mg | ORAL_TABLET | Freq: Four times a day (QID) | ORAL | Status: DC | PRN
  Administered 2022-01-17 – 2022-01-20 (×10): 5 mg via ORAL
  Filled 2022-01-17 (×10): qty 1

## 2022-01-17 NOTE — Progress Notes (Addendum)
Daily Progress Note Intern Pager: [phone]  Patient name: Derek French Medical record number: [id number] Date of birth: 13-Oct-1965 Age: 56 y.o. Gender: male  Primary Care Provider: Elberta Fortis, MD Consultants: GI Code Status: Full  Pt Overview and Major Events to Date:  11/25: Admitted  Assessment and Plan: Gerrit Friends. Lowden is a 56 year old male presenting with bloody stool. Pertinent PMH/PSH includes dyslipidemia, CVA, HTN, tobacco use.  * Acute GI bleeding Hemoglobin still mildly downtrending.  Most recent 12.1 from 14 on admission.  Patient still endorses abdominal painn wthat has responded or improved with pain med. On exam has RLQ abdominal tenderness.  CTA of abdomen was negative for mesenteric ischemia but shows possible fistula.  Unclear source of GI bleed at this time however still considering possible gastric ulcer, diverticulosis, perianal fistula.  GI consulted and plan to evaluate patient today. - GI consulted, Appreciate recs. - Tylenol as needed for pain -Added 5mg  of oxycodone for abdominal pain - Continue  IV protonix 40mg  BID - NPO for potential GI procedure - H&H q6h - AM CBC, CMP - Transfusion threshold <7  HTN (hypertension) Chronic. BP this morning was 156/85. - Continue amlodipine 5mg  daily.  -monitor with routine vitals   Cigarette smoker Declines NRT at this time. - Encourage cessation - Consider outpt CT lung cancer screen  Dyslipidemia Lipid panel stable. - continue  atorvastatin 40mg  daily.    FEN/GI: N.p.o. PPx: SCDs, given acute bleed Dispo:pending clinical improvement . Barriers include clinical status.   Subjective:  Patient was laying down in bed awake. Reports cramping abdominal pain he rated 8/10. Last pain med was around midnight which he said relieved the pain. No BM since admission and last BM was yesterday at home.   Objective: Temp:  [97.6 F (36.4 C)-98.1 F (36.7 C)] 97.6 F (36.4 C) (11/26 0811) Pulse Rate:   [56-65] 63 (11/26 0811) Resp:  [15-16] 16 (11/26 0811) BP: (142-170)/(79-107) 168/90 (11/26 0811) SpO2:  [95 %-100 %] 98 % (11/26 0811) Weight:  [87.5 kg] 87.5 kg (11/25 2346) Physical Exam: General: Alert, well appearing, NAD HEENT: Atraumatic, MMM, No sclera icterus CV: RRR, no murmurs, normal S1/S2 Pulm: CTAB, good WOB on RA, no crackles or wheezing Abd: Soft, no distension, RLQ abdominal tenderness Ext: No BLE edema   Laboratory: Most recent CBC Lab Results  Component Value Date   WBC 6.9 01/17/2022   HGB 12.8 (L) 01/17/2022   HCT 36.4 (L) 01/17/2022   MCV 92.2 01/17/2022   PLT 252 01/17/2022   Most recent BMP    Latest Ref Rng & Units 01/17/2022    7:41 AM  BMP  Glucose 70 - 99 mg/dL 97   BUN 6 - 20 mg/dL 8   Creatinine 01/19/2022 - 01/19/2022 mg/dL 01/19/2022   Sodium 01/19/2022 - 1.02 mmol/L 137   Potassium 3.5 - 5.1 mmol/L 3.6   Chloride 98 - 111 mmol/L 109   CO2 22 - 32 mmol/L 23   Calcium 8.9 - 10.3 mg/dL 8.4    Lipid panel: Cholesterol 144, triglycerides 53, HDL 35, VLDL 11, LDL 98. INR 1.1 APTT 34 seconds  Imaging/Diagnostic Tests: CTA abdomen pelvis Radiologist Impression: No acute abnormality. No evidence of active GI bleed or mesenteric ischemia. Linear soft tissue density along the left gluteal fold is re-demonstrated and may reflect a perianal fistula. No evidence of abscess. My interpretation: Negative for mesenteric ischemia.  Possible perianal fistula 7.25, MD 01/17/2022, 9:08 AM  PGY-2, [facility] FPTS Intern pager: [phone], text pages welcome Secure chat group [place]

## 2022-01-17 NOTE — Plan of Care (Signed)

## 2022-01-17 NOTE — Anesthesia Preprocedure Evaluation (Addendum)
Anesthesia Evaluation  Patient identified by MRN, date of birth, ID band Patient awake    Reviewed: Allergy & Precautions, NPO status , Patient's Chart, lab work & pertinent test results  Airway Mallampati: I  TM Distance: >3 FB Neck ROM: Full    Dental  (+) Poor Dentition, Dental Advisory Given, Chipped,    Pulmonary Current Smoker and Patient abstained from smoking.   Pulmonary exam normal breath sounds clear to auscultation       Cardiovascular hypertension, Pt. on medications Normal cardiovascular exam+ Valvular Problems/Murmurs (mod MR) MR  Rhythm:Regular Rate:Normal  TTE 2017 - Left ventricle: The cavity size was normal. Systolic function was    normal. The estimated ejection fraction was in the range of 55%    to 60%. Wall motion was normal; there were no regional wall    motion abnormalities. Doppler parameters are consistent with    abnormal left ventricular relaxation (grade 1 diastolic    dysfunction). Doppler parameters are consistent with    indeterminate ventricular filling pressure.  - Aortic valve: Transvalvular velocity was within the normal range.    There was no stenosis. There was moderate regurgitation.    Regurgitation pressure half-time: 332 ms.  - Mitral valve: Transvalvular velocity was within the normal range.    There was no evidence for stenosis. There was no regurgitation.  - Right ventricle: The cavity size was normal. Wall thickness was    normal. Systolic function was normal.  - Atrial septum: No defect or patent foramen ovale was identified.  - Tricuspid valve: There was no regurgitation.  - Inferior vena cava: The vessel was normal in size. The    respirophasic diameter changes were in the normal range (>= 50%     Neuro/Psych  Headaches CVA, No Residual Symptoms  negative psych ROS   GI/Hepatic negative GI ROS,,,(+)     substance abuse  cocaine use  Endo/Other  negative endocrine ROS     Renal/GU negative Renal ROS  negative genitourinary   Musculoskeletal negative musculoskeletal ROS (+)    Abdominal   Peds  Hematology negative hematology ROS (+)   Anesthesia Other Findings   Reproductive/Obstetrics                             Anesthesia Physical Anesthesia Plan  ASA: 3  Anesthesia Plan: MAC   Post-op Pain Management:    Induction: Intravenous  PONV Risk Score and Plan: Propofol infusion and Treatment may vary due to age or medical condition  Airway Management Planned: Natural Airway  Additional Equipment:   Intra-op Plan:   Post-operative Plan:   Informed Consent: I have reviewed the patients History and Physical, chart, labs and discussed the procedure including the risks, benefits and alternatives for the proposed anesthesia with the patient or authorized representative who has indicated his/her understanding and acceptance.     Dental advisory given  Plan Discussed with: CRNA  Anesthesia Plan Comments:        Anesthesia Quick Evaluation

## 2022-01-17 NOTE — Consult Note (Addendum)
[facility] Gastroenterology Consult Note   History Derek French MRN # [id number]  Date of Admission: 01/16/2022 Date of Consultation: 01/17/2022 Referring physician: Dr. Leveda Anna Santiago Bumpers, MD Primary Care Provider: Elberta Fortis, MD Primary Gastroenterologist: None-unassigned   Reason for Consultation/Chief Complaint: Abdominal pain and hematochezia  Subjective  HPI:  This is a 56 year old man with no previous regular GI care who came to the ED yesterday for 18 to 24 hours of lower abdominal pain and rectal bleeding.  It began acutely the day prior to admission, pain primarily right lower quadrant and he had several episodes of bright red blood per rectum.  This frightened him since he had not experienced something like this before.  Prior to the symptom onset, he has no chronic abdominal pain, altered bowel habits or GI bleeding.  ED provider monitored him and noted a 1 g drop in hemoglobin, and with ongoing symptoms felt it was best for him to be admitted.  He tells me that there was loose stool along with abdominal pain and bleeding, but the diarrhea has since resolved and he has had no bowel movement nor passage of blood since admission overnight.  He continues to have right lower quadrant abdominal pain.  No nausea vomiting dysphagia odynophagia, loss of appetite or recent weight loss.  It is difficult to be certain from his description, but he may also be having some rectal pain as he describes it all as a localized "just down there".  He does not receive regular medical care, but says he has insurance through his job.  He prefers to come to the ED as needed for acute medical issues.  He also has hypertension and has been on no medications, previously having taken amlodipine. He also had a cerebellar stroke in 2017 (toxicology positive for cocaine at that time as well as an 2013) and has not had regular follow-up for that or his hypertension.   ROS: Remainder systems negative except  as above  All other systems are negative except as noted above in the HPI  Past Medical History Past Medical History:  Diagnosis Date   Anemia due to GI blood loss 01/16/2022   Cocaine use    Headache(784.0)    Hypertension 05/03/2019   Recurrent boils    Stroke-like episode    07/2009, 03/2011, 09/2011    Past Surgical History Past Surgical History:  Procedure Laterality Date   HERNIA REPAIR      Family History Family History  Problem Relation Age of Onset   Stroke Father     Social History Social History   Socioeconomic History   Marital status: Single    Spouse name: Not on file   Number of children: Not on file   Years of education: Not on file   Highest education level: Not on file  Occupational History   Not on file  Tobacco Use   Smoking status: Every Day    Packs/day: 0.50    Years: 20.00    Total pack years: 10.00    Types: Cigarettes    Start date: 07/04/2001   Smokeless tobacco: Never  Vaping Use   Vaping Use: Never used  Substance and Sexual Activity   Alcohol use: Yes    Alcohol/week: 1.0 standard drink of alcohol    Types: 1 Cans of beer per week    Comment: occ   Drug use: Yes    Types: Marijuana, Cocaine    Comment: relapsed 1 week ago   Sexual activity: Yes  Birth control/protection: Condom  Other Topics Concern   Not on file  Social History Narrative   Not on file   Social Determinants of Health   Financial Resource Strain: Not on file  Food Insecurity: No Food Insecurity (01/16/2022)   Hunger Vital Sign    Worried About Running Out of Food in the Last Year: Never true    Ran Out of Food in the Last Year: Never true  Transportation Needs: No Transportation Needs (01/16/2022)   PRAPARE - Administrator, Civil ServiceTransportation    Lack of Transportation (Medical): No    Lack of Transportation (Non-Medical): No  Physical Activity: Not on file  Stress: Not on file  Social Connections: Not on file    Allergies Allergies  Allergen Reactions   Bee  Venom Anaphylaxis   Codeine Other (See Comments)    hallucinations     Outpatient Meds Home medications from the H+P and/or nursing med reconciliation reviewed.  Inpatient med list reviewed  _____________________________________________________________________ Objective   Exam:  Current vital signs  Patient Vitals for the past 8 hrs:  BP Temp Temp src Pulse Resp SpO2  01/17/22 0811 (!) 168/90 97.6 F (36.4 C) Oral 63 16 98 %  01/17/22 0413 (!) 156/85 97.9 F (36.6 C) Oral 64 16 99 %   No intake or output data in the 24 hours ending 01/17/22 1044  Physical Exam: He has had no fever since arrival to the ED.  General: this is a pleasant and anxious appearing patient in no acute distress.  Nontoxic-appearing he is laying on his right side because of the give some relief of the abdominal pain.  Eyes: sclera anicteric, no redness ENT: oral mucosa moist without lesions, no cervical or supraclavicular lymphadenopathy, fair dentition CV: RRR without murmur, S1/S2, no JVD,, no peripheral edema Resp: clear to auscultation bilaterally, normal RR and effort noted GI: soft, RLQ tenderness (somewhat heightened sensitivity to light palpation), with active bowel sounds. No guarding or palpable organomegaly noted.  No abdominal bruit Skin; warm and dry, no rash or jaundice noted Neuro: awake, alert and oriented x 3. Normal gross motor function and fluent speech. Rectal: Normal perianal exam.  No abscess or other abnormality on the buttock.  He was very tense, limiting findings on DRE.  There is no definite fissure. Labs:     Latest Ref Rng & Units 01/17/2022    7:41 AM 01/17/2022   12:53 AM 01/16/2022    8:35 PM  CBC  WBC 4.0 - 10.5 K/uL 6.9     Hemoglobin 13.0 - 17.0 g/dL 04.512.8  40.912.1  81.112.4   Hematocrit 39.0 - 52.0 % 36.4  34.5  35.3   Platelets 150 - 400 K/uL 252          Latest Ref Rng & Units 01/17/2022    7:41 AM 01/16/2022    8:18 AM 05/02/2019    3:31 PM  CMP  Glucose 70  - 99 mg/dL 97  914145  782100   BUN 6 - 20 mg/dL 8  18  18    Creatinine 0.61 - 1.24 mg/dL 9.560.85  2.130.94  0.861.13   Sodium 135 - 145 mmol/L 137  140  141   Potassium 3.5 - 5.1 mmol/L 3.6  3.5  4.4   Chloride 98 - 111 mmol/L 109  111  106   CO2 22 - 32 mmol/L 23  20  21    Calcium 8.9 - 10.3 mg/dL 8.4  8.7  9.4   Total Protein 6.5 - 8.1  g/dL  6.3  6.9   Total Bilirubin 0.3 - 1.2 mg/dL  0.5  0.4   Alkaline Phos 38 - 126 U/L  54  70   AST 15 - 41 U/L  20  17   ALT 0 - 44 U/L  16  16     Recent Labs  Lab 01/16/22 2035  INR 1.1   _________________________________________________________ Radiologic studies:  CLINICAL DATA:  Lower GI bleed; rule out mesenteric ischemia   EXAM: CTA ABDOMEN AND PELVIS WITHOUT AND WITH CONTRAST   TECHNIQUE: Multidetector CT imaging of the abdomen and pelvis was performed using the standard protocol during bolus administration of intravenous contrast. Multiplanar reconstructed images and MIPs were obtained and reviewed to evaluate the vascular anatomy.   RADIATION DOSE REDUCTION: This exam was performed according to the departmental dose-optimization program which includes automated exposure control, adjustment of the mA and/or kV according to patient size and/or use of iterative reconstruction technique.   CONTRAST:  OMNIPAQUE IOHEXOL 350 MG/ML SOLN   COMPARISON:  CT abdomen and pelvis earlier today   FINDINGS: VASCULAR   Aorta: Normal caliber aorta without aneurysm, dissection, vasculitis or significant stenosis. Mild aortic atherosclerosis.   Celiac: Patent without evidence of aneurysm, dissection, vasculitis or significant stenosis.   SMA: Patent without evidence of aneurysm, dissection, vasculitis or significant stenosis.   Renals: Both renal arteries are patent without evidence of aneurysm, dissection, vasculitis, fibromuscular dysplasia or significant stenosis.   IMA: Patent without evidence of aneurysm, dissection, vasculitis  or significant stenosis.   Inflow: Patent without evidence of aneurysm, dissection, vasculitis or significant stenosis.   Proximal Outflow: Bilateral common femoral and visualized portions of the superficial and profunda femoral arteries are patent without evidence of aneurysm, dissection, vasculitis or significant stenosis.   Veins: Patent portal vein.   Review of the MIP images confirms the above findings.   NON-VASCULAR   Lower chest: No acute abnormality.  Bibasilar scarring.   Hepatobiliary: No focal liver abnormality is seen. No gallstones, gallbladder wall thickening, or biliary dilatation.   Pancreas: Unremarkable. No pancreatic ductal dilatation or surrounding inflammatory changes.   Spleen: Normal in size without focal abnormality.   Adrenals/Urinary Tract: Adrenal glands are unremarkable. Kidneys are normal, without renal calculi, focal lesion, or hydronephrosis. Contrast within the bladder from prior CT scan.   Stomach/Bowel: Stomach is within normal limits. Appendix appears normal. No evidence of bowel wall thickening, distention, or inflammatory changes. No evidence of active GI bleeding or mesenteric ischemia. Mild colonic diverticulosis without diverticulitis.   Redemonstrated linear soft tissue density along the left gluteal fold measuring 3.5 cm in length. This may reflect underlying perianal fistula. No fluid collection to suggest abscess.   Lymphatic: No lymphadenopathy.   Reproductive: Mild prostate enlargement.   Other: No free intraperitoneal fluid or air.   Musculoskeletal: No acute osseous abnormality.   IMPRESSION: VASCULAR   No acute abnormality. No evidence of active GI bleed or mesenteric ischemia.   NON-VASCULAR   Linear soft tissue density along the left gluteal fold is redemonstrated and may reflect a perianal fistula. No evidence of abscess.   Colonic diverticulosis without diverticulitis.   Prostatomegaly.   Aortic  Atherosclerosis (ICD10-I70.0).     Electronically Signed   By: Minerva Fester M.D.   On: 01/16/2022 22:28   ______________________________________________________ Other studies:   _______________________________________________________ Assessment & Plan  Impression:  Acute onset right lower quadrant pain and diarrhea.  Diarrhea resolved, pain persists. Cause unclear  - despite lack of  oral contrast on the CT angiogram, there is no inflammatory process seen such as appendicitis or right-sided diverticulitis, ischemic colitis (which would be unusual for right lower quadrant pain), bowel obstruction or small bowel inflammation.  Rectal bleeding with modest drop in hemoglobin, now stable.  Lower GI bleeding of unclear cause, possibly anal fissure or internal hemorrhoids precipitated by acute diarrheal illness.  No recent antibiotic use to suggest C. difficile, and he has no longer having diarrhea.  Abnormal imaging GI with buttock findings on CT scan -this is not an active fistula, it is a radiographic remnant of his previous abscess. ________  His apparent anxiety makes examination and therefore diagnosis somewhat challenging.  I suspect he may have had an acute viral gastroenteritis with residual crampy pain that has set off some benign anal rectal bleeding.  Nevertheless, he should have a clearer diagnosis prior to discharge or he is at high risk of return.  He also seems at increased risk of failing outpatient follow-up given his previous behavior regarding healthcare access.   Plan:  Colonoscopy tomorrow.  Procedure described in detail along with risks and benefits and he was agreeable.  The benefits and risks of the planned procedure were described in detail with the patient or (when appropriate) their health care proxy.  Risks were outlined as including, but not limited to, bleeding, infection, perforation, adverse medication reaction leading to cardiac or pulmonary decompensation,  pancreatitis (if ERCP).  The limitation of incomplete mucosal visualization was also discussed.  No guarantees or warranties were given.   He does not need every 6 hour hemoglobin and hematocrit checks anymore as he is not having any ongoing bleeding and only had a modest drop in hemoglobin initially.  Thank you for the courtesy of this consult.  Please contact me with any questions or concerns.  Charlie Pitter III Office: [phone]

## 2022-01-17 NOTE — H&P (View-Only) (Signed)
[facility] Gastroenterology Consult Note   History Derek French MRN # [id number]  Date of Admission: 01/16/2022 Date of Consultation: 01/17/2022 Referring physician: Dr. Leveda Anna Santiago Bumpers, MD Primary Care Provider: Elberta Fortis, MD Primary Gastroenterologist: None-unassigned   Reason for Consultation/Chief Complaint: Abdominal pain and hematochezia  Subjective  HPI:  This is a 56 year old man with no previous regular GI care who came to the ED yesterday for 18 to 24 hours of lower abdominal pain and rectal bleeding.  It began acutely the day prior to admission, pain primarily right lower quadrant and he had several episodes of bright red blood per rectum.  This frightened him since he had not experienced something like this before.  Prior to the symptom onset, he has no chronic abdominal pain, altered bowel habits or GI bleeding.  ED provider monitored him and noted a 1 g drop in hemoglobin, and with ongoing symptoms felt it was best for him to be admitted.  He tells me that there was loose stool along with abdominal pain and bleeding, but the diarrhea has since resolved and he has had no bowel movement nor passage of blood since admission overnight.  He continues to have right lower quadrant abdominal pain.  No nausea vomiting dysphagia odynophagia, loss of appetite or recent weight loss.  It is difficult to be certain from his description, but he may also be having some rectal pain as he describes it all as a localized "just down there".  He does not receive regular medical care, but says he has insurance through his job.  He prefers to come to the ED as needed for acute medical issues.  He also has hypertension and has been on no medications, previously having taken amlodipine. He also had a cerebellar stroke in 2017 (toxicology positive for cocaine at that time as well as an 2013) and has not had regular follow-up for that or his hypertension.   ROS: Remainder systems negative except  as above  All other systems are negative except as noted above in the HPI  Past Medical History Past Medical History:  Diagnosis Date   Anemia due to GI blood loss 01/16/2022   Cocaine use    Headache(784.0)    Hypertension 05/03/2019   Recurrent boils    Stroke-like episode    07/2009, 03/2011, 09/2011    Past Surgical History Past Surgical History:  Procedure Laterality Date   HERNIA REPAIR      Family History Family History  Problem Relation Age of Onset   Stroke Father     Social History Social History   Socioeconomic History   Marital status: Single    Spouse name: Not on file   Number of children: Not on file   Years of education: Not on file   Highest education level: Not on file  Occupational History   Not on file  Tobacco Use   Smoking status: Every Day    Packs/day: 0.50    Years: 20.00    Total pack years: 10.00    Types: Cigarettes    Start date: 07/04/2001   Smokeless tobacco: Never  Vaping Use   Vaping Use: Never used  Substance and Sexual Activity   Alcohol use: Yes    Alcohol/week: 1.0 standard drink of alcohol    Types: 1 Cans of beer per week    Comment: occ   Drug use: Yes    Types: Marijuana, Cocaine    Comment: relapsed 1 week ago   Sexual activity: Yes  Birth control/protection: Condom  Other Topics Concern   Not on file  Social History Narrative   Not on file   Social Determinants of Health   Financial Resource Strain: Not on file  Food Insecurity: No Food Insecurity (01/16/2022)   Hunger Vital Sign    Worried About Running Out of Food in the Last Year: Never true    Ran Out of Food in the Last Year: Never true  Transportation Needs: No Transportation Needs (01/16/2022)   PRAPARE - Transportation    Lack of Transportation (Medical): No    Lack of Transportation (Non-Medical): No  Physical Activity: Not on file  Stress: Not on file  Social Connections: Not on file    Allergies Allergies  Allergen Reactions   Bee  Venom Anaphylaxis   Codeine Other (See Comments)    hallucinations     Outpatient Meds Home medications from the H+P and/or nursing med reconciliation reviewed.  Inpatient med list reviewed  _____________________________________________________________________ Objective   Exam:  Current vital signs  Patient Vitals for the past 8 hrs:  BP Temp Temp src Pulse Resp SpO2  01/17/22 0811 (!) 168/90 97.6 F (36.4 C) Oral 63 16 98 %  01/17/22 0413 (!) 156/85 97.9 F (36.6 C) Oral 64 16 99 %   No intake or output data in the 24 hours ending 01/17/22 1044  Physical Exam: He has had no fever since arrival to the ED.  General: this is a pleasant and anxious appearing patient in no acute distress.  Nontoxic-appearing he is laying on his right side because of the give some relief of the abdominal pain.  Eyes: sclera anicteric, no redness ENT: oral mucosa moist without lesions, no cervical or supraclavicular lymphadenopathy, fair dentition CV: RRR without murmur, S1/S2, no JVD,, no peripheral edema Resp: clear to auscultation bilaterally, normal RR and effort noted GI: soft, RLQ tenderness (somewhat heightened sensitivity to light palpation), with active bowel sounds. No guarding or palpable organomegaly noted.  No abdominal bruit Skin; warm and dry, no rash or jaundice noted Neuro: awake, alert and oriented x 3. Normal gross motor function and fluent speech. Rectal: Normal perianal exam.  No abscess or other abnormality on the buttock.  He was very tense, limiting findings on DRE.  There is no definite fissure. Labs:     Latest Ref Rng & Units 01/17/2022    7:41 AM 01/17/2022   12:53 AM 01/16/2022    8:35 PM  CBC  WBC 4.0 - 10.5 K/uL 6.9     Hemoglobin 13.0 - 17.0 g/dL 12.8  12.1  12.4   Hematocrit 39.0 - 52.0 % 36.4  34.5  35.3   Platelets 150 - 400 K/uL 252          Latest Ref Rng & Units 01/17/2022    7:41 AM 01/16/2022    8:18 AM 05/02/2019    3:31 PM  CMP  Glucose 70  - 99 mg/dL 97  145  100   BUN 6 - 20 mg/dL 8  18  18   Creatinine 0.61 - 1.24 mg/dL 0.85  0.94  1.13   Sodium 135 - 145 mmol/L 137  140  141   Potassium 3.5 - 5.1 mmol/L 3.6  3.5  4.4   Chloride 98 - 111 mmol/L 109  111  106   CO2 22 - 32 mmol/L 23  20  21   Calcium 8.9 - 10.3 mg/dL 8.4  8.7  9.4   Total Protein 6.5 - 8.1   g/dL  6.3  6.9   Total Bilirubin 0.3 - 1.2 mg/dL  0.5  0.4   Alkaline Phos 38 - 126 U/L  54  70   AST 15 - 41 U/L  20  17   ALT 0 - 44 U/L  16  16     Recent Labs  Lab 01/16/22 2035  INR 1.1   _________________________________________________________ Radiologic studies:  CLINICAL DATA:  Lower GI bleed; rule out mesenteric ischemia   EXAM: CTA ABDOMEN AND PELVIS WITHOUT AND WITH CONTRAST   TECHNIQUE: Multidetector CT imaging of the abdomen and pelvis was performed using the standard protocol during bolus administration of intravenous contrast. Multiplanar reconstructed images and MIPs were obtained and reviewed to evaluate the vascular anatomy.   RADIATION DOSE REDUCTION: This exam was performed according to the departmental dose-optimization program which includes automated exposure control, adjustment of the mA and/or kV according to patient size and/or use of iterative reconstruction technique.   CONTRAST:  OMNIPAQUE IOHEXOL 350 MG/ML SOLN   COMPARISON:  CT abdomen and pelvis earlier today   FINDINGS: VASCULAR   Aorta: Normal caliber aorta without aneurysm, dissection, vasculitis or significant stenosis. Mild aortic atherosclerosis.   Celiac: Patent without evidence of aneurysm, dissection, vasculitis or significant stenosis.   SMA: Patent without evidence of aneurysm, dissection, vasculitis or significant stenosis.   Renals: Both renal arteries are patent without evidence of aneurysm, dissection, vasculitis, fibromuscular dysplasia or significant stenosis.   IMA: Patent without evidence of aneurysm, dissection, vasculitis  or significant stenosis.   Inflow: Patent without evidence of aneurysm, dissection, vasculitis or significant stenosis.   Proximal Outflow: Bilateral common femoral and visualized portions of the superficial and profunda femoral arteries are patent without evidence of aneurysm, dissection, vasculitis or significant stenosis.   Veins: Patent portal vein.   Review of the MIP images confirms the above findings.   NON-VASCULAR   Lower chest: No acute abnormality.  Bibasilar scarring.   Hepatobiliary: No focal liver abnormality is seen. No gallstones, gallbladder wall thickening, or biliary dilatation.   Pancreas: Unremarkable. No pancreatic ductal dilatation or surrounding inflammatory changes.   Spleen: Normal in size without focal abnormality.   Adrenals/Urinary Tract: Adrenal glands are unremarkable. Kidneys are normal, without renal calculi, focal lesion, or hydronephrosis. Contrast within the bladder from prior CT scan.   Stomach/Bowel: Stomach is within normal limits. Appendix appears normal. No evidence of bowel wall thickening, distention, or inflammatory changes. No evidence of active GI bleeding or mesenteric ischemia. Mild colonic diverticulosis without diverticulitis.   Redemonstrated linear soft tissue density along the left gluteal fold measuring 3.5 cm in length. This may reflect underlying perianal fistula. No fluid collection to suggest abscess.   Lymphatic: No lymphadenopathy.   Reproductive: Mild prostate enlargement.   Other: No free intraperitoneal fluid or air.   Musculoskeletal: No acute osseous abnormality.   IMPRESSION: VASCULAR   No acute abnormality. No evidence of active GI bleed or mesenteric ischemia.   NON-VASCULAR   Linear soft tissue density along the left gluteal fold is redemonstrated and may reflect a perianal fistula. No evidence of abscess.   Colonic diverticulosis without diverticulitis.   Prostatomegaly.   Aortic  Atherosclerosis (ICD10-I70.0).     Electronically Signed   By: Minerva Fester M.D.   On: 01/16/2022 22:28   ______________________________________________________ Other studies:   _______________________________________________________ Assessment & Plan  Impression:  Acute onset right lower quadrant pain and diarrhea.  Diarrhea resolved, pain persists. Cause unclear  - despite lack of  oral contrast on the CT angiogram, there is no inflammatory process seen such as appendicitis or right-sided diverticulitis, ischemic colitis (which would be unusual for right lower quadrant pain), bowel obstruction or small bowel inflammation.  Rectal bleeding with modest drop in hemoglobin, now stable.  Lower GI bleeding of unclear cause, possibly anal fissure or internal hemorrhoids precipitated by acute diarrheal illness.  No recent antibiotic use to suggest C. difficile, and he has no longer having diarrhea.  Abnormal imaging GI with buttock findings on CT scan -this is not an active fistula, it is a radiographic remnant of his previous abscess. ________  His apparent anxiety makes examination and therefore diagnosis somewhat challenging.  I suspect he may have had an acute viral gastroenteritis with residual crampy pain that has set off some benign anal rectal bleeding.  Nevertheless, he should have a clearer diagnosis prior to discharge or he is at high risk of return.  He also seems at increased risk of failing outpatient follow-up given his previous behavior regarding healthcare access.   Plan:  Colonoscopy tomorrow.  Procedure described in detail along with risks and benefits and he was agreeable.  The benefits and risks of the planned procedure were described in detail with the patient or (when appropriate) their health care proxy.  Risks were outlined as including, but not limited to, bleeding, infection, perforation, adverse medication reaction leading to cardiac or pulmonary decompensation,  pancreatitis (if ERCP).  The limitation of incomplete mucosal visualization was also discussed.  No guarantees or warranties were given.   He does not need every 6 hour hemoglobin and hematocrit checks anymore as he is not having any ongoing bleeding and only had a modest drop in hemoglobin initially.  Thank you for the courtesy of this consult.  Please contact me with any questions or concerns.  Charlie Pitter III Office: [phone]

## 2022-01-18 ENCOUNTER — Encounter (HOSPITAL_COMMUNITY)
Admission: EM | Disposition: A | Discharge status: Home / Self Care | Payer: Self-pay | Source: Home / Self Care | Attending: Family Medicine

## 2022-01-18 ENCOUNTER — Observation Stay (HOSPITAL_COMMUNITY): Payer: Self-pay | Admitting: Anesthesiology

## 2022-01-18 DIAGNOSIS — I10 Essential (primary) hypertension: Secondary | ICD-10-CM

## 2022-01-18 DIAGNOSIS — K269 Duodenal ulcer, unspecified as acute or chronic, without hemorrhage or perforation: Secondary | ICD-10-CM

## 2022-01-18 DIAGNOSIS — R1031 Right lower quadrant pain: Secondary | ICD-10-CM

## 2022-01-18 DIAGNOSIS — K222 Esophageal obstruction: Secondary | ICD-10-CM

## 2022-01-18 DIAGNOSIS — I340 Nonrheumatic mitral (valve) insufficiency: Secondary | ICD-10-CM

## 2022-01-18 DIAGNOSIS — F17210 Nicotine dependence, cigarettes, uncomplicated: Secondary | ICD-10-CM

## 2022-01-18 DIAGNOSIS — K922 Gastrointestinal hemorrhage, unspecified: Secondary | ICD-10-CM | POA: Diagnosis present

## 2022-01-18 HISTORY — PX: COLONOSCOPY WITH PROPOFOL: SHX5780

## 2022-01-18 LAB — CBC
HCT: 36.3 % — ABNORMAL LOW (ref 39.0–52.0)
HCT: 37.8 % — ABNORMAL LOW (ref 39.0–52.0)
Hemoglobin: 13.1 g/dL (ref 13.0–17.0)
Hemoglobin: 12.7 g/dL — ABNORMAL LOW (ref 13.0–17.0)
MCH: 33 pg (ref 26.0–34.0)
MCH: 31.6 pg (ref 26.0–34.0)
MCHC: 36.1 g/dL — ABNORMAL HIGH (ref 30.0–36.0)
MCHC: 33.6 g/dL (ref 30.0–36.0)
MCV: 91.4 fL (ref 80.0–100.0)
MCV: 94 fL (ref 80.0–100.0)
Platelets: 259 10*3/uL (ref 150–400)
Platelets: 268 10*3/uL (ref 150–400)
RBC: 3.97 MIL/uL — ABNORMAL LOW (ref 4.22–5.81)
RBC: 4.02 MIL/uL — ABNORMAL LOW (ref 4.22–5.81)
RDW: 13.2 % (ref 11.5–15.5)
RDW: 13.2 % (ref 11.5–15.5)
WBC: 5.9 10*3/uL (ref 4.0–10.5)
WBC: 6.5 10*3/uL (ref 4.0–10.5)
nRBC: 0 % (ref 0.0–0.2)
nRBC: 0 % (ref 0.0–0.2)

## 2022-01-18 LAB — HEMOGLOBIN A1C
Hgb A1c MFr Bld: 6.1 % — ABNORMAL HIGH (ref 4.8–5.6)
Mean Plasma Glucose: 128 mg/dL

## 2022-01-18 SURGERY — COLONOSCOPY WITH PROPOFOL
Anesthesia: Monitor Anesthesia Care

## 2022-01-18 MED ORDER — PROPOFOL 10 MG/ML IV BOLUS
INTRAVENOUS | Status: DC | PRN
  Administered 2022-01-18 (×3): 20 mg via INTRAVENOUS

## 2022-01-18 MED ORDER — PANTOPRAZOLE SODIUM 40 MG PO TBEC
40.0000 mg | DELAYED_RELEASE_TABLET | Freq: Two times a day (BID) | ORAL | Status: DC
  Administered 2022-01-18 – 2022-01-20 (×5): 40 mg via ORAL
  Filled 2022-01-18 (×5): qty 1

## 2022-01-18 MED ORDER — SODIUM CHLORIDE 0.9 % IV SOLN: INTRAVENOUS | Status: DC

## 2022-01-18 MED ORDER — LACTATED RINGERS IV SOLN
INTRAVENOUS | Status: AC | PRN
  Administered 2022-01-18: 1000 mL via INTRAVENOUS

## 2022-01-18 MED ORDER — PROPOFOL 500 MG/50ML IV EMUL
INTRAVENOUS | Status: DC | PRN
  Administered 2022-01-18: 125 ug/kg/min via INTRAVENOUS

## 2022-01-18 MED ORDER — PEG-KCL-NACL-NASULF-NA ASC-C 100 G PO SOLR: 1.0000 | Freq: Once | ORAL | Status: DC

## 2022-01-18 MED ORDER — PEG-KCL-NACL-NASULF-NA ASC-C 100 G PO SOLR
0.5000 | Freq: Once | ORAL | Status: DC
  Filled 2022-01-18: qty 1

## 2022-01-18 MED ORDER — LIDOCAINE 2% (20 MG/ML) 5 ML SYRINGE
INTRAMUSCULAR | Status: DC | PRN
  Administered 2022-01-18: 60 mg via INTRAVENOUS

## 2022-01-18 MED ORDER — PHENYLEPHRINE HCL (PRESSORS) 10 MG/ML IV SOLN
INTRAVENOUS | Status: DC | PRN
  Administered 2022-01-18 (×3): 120 ug via INTRAVENOUS

## 2022-01-18 SURGICAL SUPPLY — 22 items

## 2022-01-18 NOTE — Op Note (Signed)
[facility] Patient Name: Derek French Procedure Date : 01/18/2022 MRN: [id number] Attending MD: Gladstone Pih. Candis Schatz , MD, [id number] Date of Birth: 1965-06-18 CSN: [id number] Age: 56 Admit Type: Inpatient Procedure:                Colonoscopy Indications:              Abdominal pain in the right lower quadrant,                            Hematochezia Providers:                Nicki Reaper E. Candis Schatz, MD, Velva Harman, RN, [facility] Technician, Technician Referring MD:              Medicines:                Monitored Anesthesia Care Complications:            No immediate complications. Estimated Blood Loss:     Estimated blood loss: none. Procedure:                Pre-Anesthesia Assessment:                           - Prior to the procedure, a History and Physical                            was performed, and patient medications and                            allergies were reviewed. The patient's tolerance of                            previous anesthesia was also reviewed. The risks                            and benefits of the procedure and the sedation                            options and risks were discussed with the patient.                            All questions were answered, and informed consent                            was obtained. Prior Anticoagulants: The patient has                            taken no anticoagulant or antiplatelet agents. ASA                            Grade Assessment: II - A patient with mild systemic  disease. After reviewing the risks and benefits,                            the patient was deemed in satisfactory condition to                            undergo the procedure.                           After obtaining informed consent, the colonoscope                            was passed under direct vision. Throughout the                            procedure, the patient's  blood pressure, pulse, and                            oxygen saturations were monitored continuously. The                            [id number] Olympus peds colonoscope was                            introduced through the anus and advanced to the the                            terminal ileum, with identification of the                            appendiceal orifice and IC valve. The colonoscopy                            was performed without difficulty. The patient                            tolerated the procedure well. The quality of the                            bowel preparation was fair. The terminal ileum,                            ileocecal valve, appendiceal orifice, and rectum                            were photographed. Scope In: 11:49:02 AM Scope Out: 12:12:41 PM Scope Withdrawal Time: 0 hours 15 minutes 59 seconds  Total Procedure Duration: 0 hours 23 minutes 39 seconds  Findings:      The perianal exam findings include scarring on R and L buttock.      The digital rectal exam was normal. Pertinent negatives include normal       sphincter tone and no palpable rectal lesions.      A few small-mouthed diverticula were found in the sigmoid colon and  descending colon.      Blood was found in the entire colon, characterized by clots and maroon       colored stool.      The terminal ileum contained dark red/maroon blood. No ulcers, masses or       inflammation were seen. TI was intubated about 10-15 cm.      The remainder of the exam in the terminal ileum was normal.      The exam was otherwise normal throughout the examined colon.      The retroflexed view of the distal rectum and anal verge was normal and       showed no anal or rectal abnormalities. Impression:               - Preparation of the colon was fair.                           - Scarring on R and L buttock found on perianal                            exam.                           -  Diverticulosis in the sigmoid colon and in the                            descending colon.                           - Blood in the entire examined colon.                           - Blood in the terminal ileum.                           - The distal rectum and anal verge are normal on                            retroflexion view.                           - No specimens collected.                           - No explanations for patient's abdominal pain and                            bloody stools on colonoscopy. Patient has mild left                            sided diverticulosis, but this would not explain                            RLQ pain or blood in the terminal ileum.                           - Bleeding appears to be arising from  upper GI                            tract or small bowel. Recommendation:           - Return patient to hospital ward for ongoing care.                           - Clear liquid diet today, NPO after midnight                           - Continue present medications.                           - Plan for EGD tomorrow with possible capsule                            endoscopy if no abnormalities found on EGD.                           - Trend CBC q12hrs.                           - Restart PPI PO BID                           - Given fair bowel prep quality, small polyps may                            have been missed. Would recommend outpatient                            screening colonoscopy within 3 years. Procedure Code(s):        --- Professional ---                           [phone], Colonoscopy, flexible; diagnostic, including                            collection of specimen(s) by brushing or washing,                            when performed (separate procedure) Diagnosis Code(s):        --- Professional ---                           K92.2, Gastrointestinal hemorrhage, unspecified                           R10.31, Right lower quadrant pain                            K92.1, Melena (includes Hematochezia)                           K57.30, Diverticulosis of large intestine without  perforation or abscess without bleeding CPT copyright 2022 American [employer] Association. All rights reserved. The codes documented in this report are preliminary and upon coder review may  be revised to meet current compliance requirements. Giovanni Bath E. Candis Schatz, MD 01/18/2022 12:28:45 PM This report has been signed electronically. Number of Addenda: 0

## 2022-01-18 NOTE — Plan of Care (Signed)

## 2022-01-18 NOTE — Interval H&P Note (Signed)
History and Physical Interval Note:  01/18/2022 11:22 AM  Derek French  has presented today for surgery, with the diagnosis of rlq pain and hematochezia.  The various methods of treatment have been discussed with the patient and family. After consideration of risks, benefits and other options for treatment, the patient has consented to  Procedure(s): COLONOSCOPY WITH PROPOFOL (N/A) as a surgical intervention.  The patient's history has been reviewed, patient examined, no change in status, stable for surgery.  I have reviewed the patient's chart and labs.  Questions were answered to the patient's satisfaction.     Jenel Lucks

## 2022-01-18 NOTE — Progress Notes (Signed)
     Daily Progress Note Intern Pager: [phone]  Patient name: Derek French Medical record number: [id number] Date of birth: 1965-06-27 Age: 56 y.o. Gender: male  Primary Care Provider: Elberta Fortis, MD Consultants: GI Code Status: Full  Pt Overview and Major Events to Date:  11/25: Admitted  Assessment and Plan: Gerrit Friends. Dials is a 56 year old male presenting with bloody stool. Pertinent PMH/PSH includes dyslipidemia, CVA, HTN, tobacco use.   * Acute GI bleeding Hemoglobin uptrending: 12.8>13.1. On exam has tenderness mainly in the right lower quadrant of the abdomen.  He reports being on bowel prep regimen per GI, had a bloody bowel movement this morning.  CTA of abdomen shows possible fistula.  Unclear source of GI bleed at this time however still considering possible anal fissure or internal hemorrhoids precipitated by acute diarrheal illness.  GI consulted and plan for colonoscopy today. - GI consulted, Appreciate recs. - Tylenol as needed for pain - Oxycodone 5mg  Q6H PRN for abdominal pain - NPO for today's colonoscopy - Calprotectin pending - AM CBC - Transfusion threshold <7  HTN (hypertension) Chronic. BP this morning was 134/80. - Continue amlodipine 5mg  daily.  - Monitor with routine vitals   Cigarette smoker Declines nicotine replacement therapy at this time. - Encourage cessation - Consider outpt CT lung cancer screen  Dyslipidemia LDL 98 12/2021. - continue atorvastatin 40mg  daily.    FEN/GI: N.p.o. PPx: SCDs, given acute bleed Dispo:pending clinical improvement . Barriers include clinical status.   Subjective:  Patient was seen in bed this morning.  Patient reports having bowel movements throughout the night reporting they were bloody.  He reports generalized cramping sensation in his abdomen, otherwise reports no concerns.  Objective: Temp:  [97.3 F (36.3 C)-98.1 F (36.7 C)] 97.3 F (36.3 C) (11/27 0739) Pulse Rate:  [65-74] 69 (11/27  0739) Resp:  [16-18] 18 (11/27 0739) BP: (128-151)/(80-91) 128/91 (11/27 0739) SpO2:  [81 %-99 %] 81 % (11/27 0739) Physical Exam: General: No acute distress, well-appearing middle-aged male CV: Regular rate and rhythm, no murmurs appreciated Pulm: CTAB, normal effort Abd: Minimal bowel sounds, tender to palpation in the RLQ Ext: No lower extremity edema   Laboratory: Most recent CBC Lab Results  Component Value Date   WBC 5.9 01/18/2022   HGB 13.1 01/18/2022   HCT 36.3 (L) 01/18/2022   MCV 91.4 01/18/2022   PLT 259 01/18/2022   Most recent BMP    Latest Ref Rng & Units 01/17/2022    7:41 AM  BMP  Glucose 70 - 99 mg/dL 97   BUN 6 - 20 mg/dL 8   Creatinine 01/20/2022 - 01/20/2022 mg/dL 01/19/2022   Sodium 4.19 - 6.22 mmol/L 137   Potassium 3.5 - 5.1 mmol/L 3.6   Chloride 98 - 111 mmol/L 109   CO2 22 - 32 mmol/L 23   Calcium 8.9 - 10.3 mg/dL 8.4    Lipid panel: Cholesterol 144, triglycerides 53, HDL 35, VLDL 11, LDL 98. INR 1.1 APTT 34 seconds  Imaging/Diagnostic Tests: CTA abdomen pelvis Radiologist Impression: No acute abnormality. No evidence of active GI bleed or mesenteric ischemia. Linear soft tissue density along the left gluteal fold is re-demonstrated and may reflect a perianal fistula. No evidence of abscess.  2.97, MD 01/18/2022, 8:30 AM  PGY-1, [facility] FPTS Intern pager: [phone], text pages welcome Secure chat group [facility] Teaching Service

## 2022-01-18 NOTE — Transfer of Care (Signed)
Immediate Anesthesia Transfer of Care Note  Patient: Derek French  Procedure(s) Performed: COLONOSCOPY WITH PROPOFOL  Patient Location: Endoscopy Unit  Anesthesia Type:MAC  Level of Consciousness: drowsy and responds to stimulation  Airway & Oxygen Therapy: Patient Spontanous Breathing and Patient connected to face mask oxygen  Post-op Assessment: Report given to RN and Post -op Vital signs reviewed and stable  Post vital signs: Reviewed and stable  Last Vitals:  Vitals Value Taken Time  BP 99/54 01/18/22 1218  Temp 36.3 C 01/18/22 1218  Pulse 83 01/18/22 1219  Resp 14 01/18/22 1219  SpO2 95 % 01/18/22 1219  Vitals shown include unvalidated device data.  Last Pain:  Vitals:   01/18/22 1218  TempSrc: Temporal  PainSc:       Patients Stated Pain Goal: 0 (38/18/40 3754)  Complications: No notable events documented.

## 2022-01-18 NOTE — Discharge Summary (Incomplete)
   Family [facility] Discharge Summary  Patient name: Derek French Medical record number: [id number] Date of birth: 08-16-1965 Age: 56 y.o. Gender: male Date of Admission: 01/16/2022  Date of Discharge: *** Admitting Physician: Moses Manners, MD  Primary Care Provider: Elberta Fortis, MD Consultants: GI  Indication for Hospitalization: Acute GI Bleeding  Discharge Diagnoses/Problem List:  Principal Problem for Admission: Acute GI Bleeding Other Problems addressed during stay:  Principal Problem:   Acute GI bleeding Active Problems:   Dyslipidemia   Cigarette smoker   HTN (hypertension)   Anemia due to GI blood loss    Brief Hospital Course:  Gerrit Friends. Yankee is a 56 year old male presenting with bloody stool. Pertinent PMH/PSH includes dyslipidemia, CVA, HTN, tobacco use.  His hospital course is outlined below:    Acute GI Bleed:  On presentation hgb dropped from 14>12.1. Exam positive for RLQ abdominal tenderness. CTA abdomen ruled out mesenteric ischemia but shows possible fistula. GI was consulted and preformed a colonoscopy 11/27 which showed ***. They recommended *** with outpatient follow up. His hemoglobin at discharge was ***. He was prescribed *** to take outpatient.    PCP Follow-up: 1) Restarted statin 2) Restarted BP med 3) Consider CTA for lung cancer screening 4) Encourage smoking cessation as willing.  Disposition: home  Discharge Condition: stable   Discharge Exam:  Vitals:   01/18/22 0551 01/18/22 0739  BP: 134/80 (!) 128/91  Pulse: 74 69  Resp:  18  Temp: 98.1 F (36.7 C) (!) 97.3 F (36.3 C)  SpO2: 97% (!) 81%   ***  Significant Procedures: ***  Significant Labs and Imaging:  Recent Labs  Lab 01/17/22 0053 01/17/22 0741 01/18/22 0421  WBC  --  6.9 5.9  HGB 12.1* 12.8* 13.1  HCT 34.5* 36.4* 36.3*  PLT  --  252 259   Recent Labs  Lab 01/17/22 0741  NA 137  K 3.6  CL 109  CO2 23  GLUCOSE 97  BUN  8  CREATININE 0.85  CALCIUM 8.4*    General: No acute distress, well-appearing middle-aged male CV: Regular rate and rhythm, no murmurs appreciated Pulm: CTAB, normal effort Abd: Minimal bowel sounds, tender to palpation in the RLQ Ext: No lower extremity edema  Results/Tests Pending at Time of Discharge: none  Discharge Medications:  Allergies as of 01/18/2022       Reactions   Bee Venom Anaphylaxis   Codeine Other (See Comments)   hallucinations     Med Rec must be completed prior to using this [facility]***       Discharge Instructions: Please refer to Patient Instructions section of EMR for full details.  Patient was counseled important signs and symptoms that should prompt return to medical care, changes in medications, dietary instructions, activity restrictions, and follow up appointments.   Follow-Up Appointments:   Lance Muss, MD 01/18/2022, 9:11 AM PGY-1, [facility]

## 2022-01-19 ENCOUNTER — Inpatient Hospital Stay (HOSPITAL_COMMUNITY): Payer: Self-pay | Admitting: Certified Registered Nurse Anesthetist

## 2022-01-19 ENCOUNTER — Encounter (HOSPITAL_COMMUNITY)
Admission: EM | Disposition: A | Discharge status: Home / Self Care | Payer: Self-pay | Source: Home / Self Care | Attending: Family Medicine

## 2022-01-19 ENCOUNTER — Other Ambulatory Visit (HOSPITAL_COMMUNITY): Payer: Self-pay

## 2022-01-19 ENCOUNTER — Encounter (HOSPITAL_COMMUNITY): Payer: Self-pay | Admitting: Student

## 2022-01-19 DIAGNOSIS — I10 Essential (primary) hypertension: Secondary | ICD-10-CM

## 2022-01-19 DIAGNOSIS — K3189 Other diseases of stomach and duodenum: Secondary | ICD-10-CM

## 2022-01-19 DIAGNOSIS — F17200 Nicotine dependence, unspecified, uncomplicated: Secondary | ICD-10-CM

## 2022-01-19 DIAGNOSIS — K269 Duodenal ulcer, unspecified as acute or chronic, without hemorrhage or perforation: Secondary | ICD-10-CM

## 2022-01-19 DIAGNOSIS — K449 Diaphragmatic hernia without obstruction or gangrene: Secondary | ICD-10-CM

## 2022-01-19 DIAGNOSIS — K222 Esophageal obstruction: Secondary | ICD-10-CM

## 2022-01-19 HISTORY — PX: ESOPHAGOGASTRODUODENOSCOPY: SHX5428

## 2022-01-19 HISTORY — PX: BIOPSY: SHX5522

## 2022-01-19 LAB — ANA: Anti Nuclear Antibody (ANA): NEGATIVE

## 2022-01-19 LAB — CBC
HCT: 37 % — ABNORMAL LOW (ref 39.0–52.0)
HCT: 36.1 % — ABNORMAL LOW (ref 39.0–52.0)
Hemoglobin: 12.7 g/dL — ABNORMAL LOW (ref 13.0–17.0)
Hemoglobin: 12.8 g/dL — ABNORMAL LOW (ref 13.0–17.0)
MCH: 32 pg (ref 26.0–34.0)
MCH: 32.7 pg (ref 26.0–34.0)
MCHC: 34.3 g/dL (ref 30.0–36.0)
MCHC: 35.5 g/dL (ref 30.0–36.0)
MCV: 93.2 fL (ref 80.0–100.0)
MCV: 92.3 fL (ref 80.0–100.0)
Platelets: 271 10*3/uL (ref 150–400)
Platelets: 274 10*3/uL (ref 150–400)
RBC: 3.97 MIL/uL — ABNORMAL LOW (ref 4.22–5.81)
RBC: 3.91 MIL/uL — ABNORMAL LOW (ref 4.22–5.81)
RDW: 13.2 % (ref 11.5–15.5)
RDW: 13.2 % (ref 11.5–15.5)
WBC: 6.2 10*3/uL (ref 4.0–10.5)
WBC: 8.9 10*3/uL (ref 4.0–10.5)
nRBC: 0 % (ref 0.0–0.2)
nRBC: 0 % (ref 0.0–0.2)

## 2022-01-19 SURGERY — EGD (ESOPHAGOGASTRODUODENOSCOPY)
Anesthesia: Monitor Anesthesia Care

## 2022-01-19 MED ORDER — PHENYLEPHRINE 80 MCG/ML (10ML) SYRINGE FOR IV PUSH (FOR BLOOD PRESSURE SUPPORT)
PREFILLED_SYRINGE | INTRAVENOUS | Status: DC | PRN
  Administered 2022-01-19 (×4): 80 ug via INTRAVENOUS

## 2022-01-19 MED ORDER — PANTOPRAZOLE SODIUM 40 MG PO TBEC
40.0000 mg | DELAYED_RELEASE_TABLET | Freq: Two times a day (BID) | ORAL | 1 refills | Status: DC
  Filled 2022-01-19: qty 60, 30d supply, fill #0

## 2022-01-19 MED ORDER — AMLODIPINE BESYLATE 5 MG PO TABS
5.0000 mg | ORAL_TABLET | Freq: Every day | ORAL | 3 refills | Status: DC
  Filled 2022-01-19: qty 30, 30d supply, fill #0

## 2022-01-19 MED ORDER — OXYCODONE HCL 5 MG/5ML PO SOLN: 5.0000 mg | Freq: Once | ORAL | Status: DC | PRN

## 2022-01-19 MED ORDER — PROPOFOL 500 MG/50ML IV EMUL
INTRAVENOUS | Status: DC | PRN
  Administered 2022-01-19: 100 ug/kg/min via INTRAVENOUS
  Administered 2022-01-19: 160 ug/kg/min via INTRAVENOUS

## 2022-01-19 MED ORDER — ATORVASTATIN CALCIUM 40 MG PO TABS
40.0000 mg | ORAL_TABLET | Freq: Every day | ORAL | 3 refills | Status: DC
  Filled 2022-01-19: qty 30, 30d supply, fill #0

## 2022-01-19 MED ORDER — OXYCODONE HCL 5 MG PO TABS: 5.0000 mg | ORAL_TABLET | Freq: Once | ORAL | Status: DC | PRN

## 2022-01-19 MED ORDER — HYDROMORPHONE HCL 1 MG/ML IJ SOLN: 0.2500 mg | INTRAMUSCULAR | Status: DC | PRN

## 2022-01-19 MED ORDER — PNEUMOCOCCAL 20-VAL CONJ VACC 0.5 ML IM SUSY
0.5000 mL | PREFILLED_SYRINGE | INTRAMUSCULAR | Status: AC | PRN
  Administered 2022-01-20: 0.5 mL via INTRAMUSCULAR
  Filled 2022-01-19: qty 0.5

## 2022-01-19 MED ORDER — PROMETHAZINE HCL 25 MG/ML IJ SOLN: 6.2500 mg | INTRAMUSCULAR | Status: DC | PRN

## 2022-01-19 MED ORDER — PROPOFOL 10 MG/ML IV BOLUS
INTRAVENOUS | Status: DC | PRN
  Administered 2022-01-19: 20 mg via INTRAVENOUS
  Administered 2022-01-19: 10 mg via INTRAVENOUS

## 2022-01-19 NOTE — Discharge Instructions (Addendum)
Thank you for letting us take care of you!   You were admitted due to concern for a gastrointestinal bleed. Your colonoscopy did not show a clear cause of your bleeding; thus you were evaluated with an endoscopy with showed an ulcer in your small intestine. You were treated with a medication called PPI. Your hemoglobin (blood count) was monitored throughout your admission and was stable.   You are recommended to have another colonoscopy in 3 years.   Please follow up with your primary care physician, Dr. Barb Merino, on 12/4 at 2:50 PM.

## 2022-01-19 NOTE — Anesthesia Postprocedure Evaluation (Signed)
Anesthesia Post Note  Patient: Derek French  Procedure(s) Performed: ESOPHAGOGASTRODUODENOSCOPY (EGD) BIOPSY     Patient location during evaluation: PACU Anesthesia Type: MAC Level of consciousness: awake and alert Pain management: pain level controlled Vital Signs Assessment: post-procedure vital signs reviewed and stable Respiratory status: spontaneous breathing, nonlabored ventilation and respiratory function stable Cardiovascular status: blood pressure returned to baseline and stable Postop Assessment: no apparent nausea or vomiting Anesthetic complications: no   No notable events documented.  Last Vitals:  Vitals:   01/19/22 0930 01/19/22 0949  BP: 114/86 129/86  Pulse: 75 70  Resp: 12 15  Temp: (!) 36.4 C 36.6 C  SpO2: 97% 99%    Last Pain:  Vitals:   01/19/22 0949  TempSrc: Oral  PainSc: 0-No pain                 Lynda Rainwater

## 2022-01-19 NOTE — Op Note (Signed)
[facility] Patient Name: Derek French Procedure Date : 01/19/2022 MRN: [id number] Attending MD: Dub Amis. Tomasa Rand , MD, [id number] Date of Birth: 14-Aug-1965 CSN: [id number] Age: 56 Admit Type: Inpatient Procedure:                Upper GI endoscopy Indications:              Gastrointestinal bleeding of unknown origin Providers:                Imari Reen E. Tomasa Rand, MD, Fransisca Connors, Beryle Beams, Technician Referring MD:              Medicines:                Monitored Anesthesia Care Complications:            No immediate complications. Estimated Blood Loss:     Estimated blood loss was minimal. Procedure:                Pre-Anesthesia Assessment:                           - Prior to the procedure, a History and Physical                            was performed, and patient medications and                            allergies were reviewed. The patient's tolerance of                            previous anesthesia was also reviewed. The risks                            and benefits of the procedure and the sedation                            options and risks were discussed with the patient.                            All questions were answered, and informed consent                            was obtained. Prior Anticoagulants: The patient has                            taken no anticoagulant or antiplatelet agents. ASA                            Grade Assessment: II - A patient with mild systemic                            disease. After reviewing the risks and benefits,  the patient was deemed in satisfactory condition to                            undergo the procedure.                           After obtaining informed consent, the endoscope was                            passed under direct vision. Throughout the                            procedure, the patient's blood pressure, pulse, and                             oxygen saturations were monitored continuously. The                            [id number]-H190 ([id number] Olympus endoscope was introduced                            through the mouth, and advanced to the second part                            of duodenum. The upper GI endoscopy was                            accomplished without difficulty. The patient                            tolerated the procedure well. Scope In: Scope Out: Findings:      The examined portions of the nasopharynx, oropharynx and larynx were       normal.      A widely patent Schatzki ring was found at the gastroesophageal junction.      The exam of the esophagus was otherwise normal.      A 3 cm hiatal hernia was present.      The entire examined stomach was normal. Biopsies were taken with a cold       forceps for Helicobacter pylori testing. Estimated blood loss was       minimal.      Patchy moderately erythematous mucosa with erosions without active       bleeding and with no stigmata of bleeding was found in the duodenal       bulb. Biopsies were taken with a cold forceps for histology. Estimated       blood loss was minimal.      One non-bleeding cratered, partially re-epithelialized duodenal ulcer       with no stigmata of bleeding was found in the second portion of the       duodenum. The lesion was 8 mm in largest dimension. Biopsies were taken       with a cold forceps for Helicobacter pylori testing. Estimated blood       loss was minimal.      The exam of the duodenum was otherwise normal. Impression:               -  The examined portions of the nasopharynx,                            oropharynx and larynx were normal.                           - Widely patent Schatzki ring.                           - 3 cm hiatal hernia.                           - Normal stomach. Biopsied.                           - Erythematous duodenopathy. Biopsied.                           - Non-bleeding duodenal ulcer with  no stigmata of                            bleeding. Biopsied.                           - Suspect peptic ulcers as the cause of the                            patient's pain and bloody stools. Moderate Sedation:      N/A Recommendation:           - Return patient to hospital ward for ongoing care.                           - Resume regular diet.                           - Continue present medications.                           - Recommend patient take Protonix 40 mg PO BID for                            8 weeks                           - Await pathology results.                           - Avoid NSAIDs                           - Will hold off on video capsule endoscopy for now,                            given reasonable source of symptoms found on EGD.                           -  Recheck CBC in morning.                           - Reconsider VCE if further overt bleeding or                            significant drop in hgb. Procedure Code(s):        --- Professional ---                           [phone], Esophagogastroduodenoscopy, flexible,                            transoral; with biopsy, single or multiple Diagnosis Code(s):        --- Professional ---                           K22.2, Esophageal obstruction                           K44.9, Diaphragmatic hernia without obstruction or                            gangrene                           K31.89, Other diseases of stomach and duodenum                           K26.9, Duodenal ulcer, unspecified as acute or                            chronic, without hemorrhage or perforation                           K92.2, Gastrointestinal hemorrhage, unspecified CPT copyright 2022 American Medical Association. All rights reserved. The codes documented in this report are preliminary and upon coder review may  be revised to meet current compliance requirements. Adaleigh Warf E. Tomasa Rand, MD 01/19/2022 9:06:27 AM This report has been signed  electronically. Number of Addenda: 0

## 2022-01-19 NOTE — Progress Notes (Addendum)
Daily Progress Note Intern Pager: [phone]  Patient name: Derek French Medical record number: [id number] Date of birth: [birth year] Age: 56 y.o. Gender: male  Primary Care Provider: Colletta Maryland, MD Consultants: GI Code Status: Full  Pt Overview and Major Events to Date:  11/25: Admitted  Assessment and Plan: Derek French. Derek French is a 56 year old male presenting with bloody stool. Pertinent PMH/PSH includes dyslipidemia, CVA, HTN, tobacco use.   * Acute GI bleeding Hemoglobin stable at 12.7. On exam patient continues to have abdominal tenderness on right side.  Colonoscopy showed blood in the entire examined colon and left-sided diverticulosis, did not explain patient's RLQ abdominal pain and blood in the terminal ileum.  EGD shows some duodenitis and a healing duodenal ulcer. Suspect this was the source of his symptoms.  Per GI recs, patient will be observed overnight and can discharge tomorrow. - GI consulted, Appreciate recs. - Surgical pathology collected for duodenum and duodenal ulcer, pending - Tylenol as needed for pain - Oxycodone 5mg  Q6H PRN for abdominal pain - Calprotectin pending - AM CBC - Transfusion threshold <7  HTN (hypertension) Chronic.  SBP ranging from 120s-150s. - Continue amlodipine 5mg  daily.  - Monitor with routine vitals   Cigarette smoker - Nicotine patch - Consider outpt CT lung cancer screen  Dyslipidemia LDL 98 12/2021. - continue atorvastatin 40mg  daily.    FEN/GI: Regular PPx: SCDs, given acute bleed Dispo:pending clinical improvement . Barriers include clinical status.   Subjective:  Patient was seen in bed this afternoon.  Patient notes the procedure went well.  He reports abdominal tenderness on the right side otherwise is stable.  Objective: Temp:  [97.3 F (36.3 C)-98.2 F (36.8 C)] 97.8 F (36.6 C) (11/28 0949) Pulse Rate:  [48-92] 70 (11/28 0949) Resp:  [12-19] 15 (11/28 0949) BP: (93-158)/(54-96) 129/86 (11/28  0949) SpO2:  [94 %-100 %] 99 % (11/28 0949)  Physical Exam: General: NAD, well-appearing middle-age man Cardiovascular: RRR, no murmurs appreciated Respiratory: CTAB, normal effort Abdomen: Normal bowel sounds, nondistended, tender to palpation on the right side   Laboratory: Most recent CBC Lab Results  Component Value Date   WBC 6.2 01/19/2022   HGB 12.7 (L) 01/19/2022   HCT 37.0 (L) 01/19/2022   MCV 93.2 01/19/2022   PLT 271 01/19/2022   Most recent BMP    Latest Ref Rng & Units 01/17/2022    7:41 AM  BMP  Glucose 70 - 99 mg/dL 97   BUN 6 - 20 mg/dL 8   Creatinine 0.61 - 1.24 mg/dL 0.85   Sodium 135 - 145 mmol/L 137   Potassium 3.5 - 5.1 mmol/L 3.6   Chloride 98 - 111 mmol/L 109   CO2 22 - 32 mmol/L 23   Calcium 8.9 - 10.3 mg/dL 8.4       Imaging/Diagnostic Tests: Colonoscopy - Diverticulosis in the sigmoid colon and in the descending colon. - Blood in the entire examined colon. - Blood in the terminal ileum. - The distal rectum and anal verge are normal on retroflexion view. - No specimens collected. - No explanations for patient's abdominal pain and bloody stools on colonoscopy. Patient has mild left sided diverticulosis, but this would not explain RLQ pain or blood in the terminal ileum. - Bleeding appears to be arising from upper GI tract or small bowel.  EGD - The examined portions of the nasopharynx, oropharynx and larynx were normal. - Widely patent Schatzki ring. - 3 cm hiatal hernia. - Normal stomach. Biopsied. -  Erythematous duodenopathy. Biopsied. - Non-bleeding duodenal ulcer with no stigmata of bleeding. Biopsied. - Suspect peptic ulcers as the cause of the patient's pain and bloody stools.  Lance Muss, MD 01/19/2022, 12:03 PM  PGY-1, [facility] FPTS Intern pager: [phone], text pages welcome Secure chat group [facility] Teaching Service

## 2022-01-19 NOTE — Interval H&P Note (Signed)
History and Physical Interval Note:  01/19/2022 8:26 AM  Derek French  has presented today for surgery, with the diagnosis of GI bleed.  The various methods of treatment have been discussed with the patient and family. After consideration of risks, benefits and other options for treatment, the patient has consented to  Procedure(s): ESOPHAGOGASTRODUODENOSCOPY (EGD) (N/A) GIVENS CAPSULE STUDY (N/A) as a surgical intervention.  The patient's history has been reviewed, patient examined, no change in status, stable for surgery.  I have reviewed the patient's chart and labs.  Questions were answered to the patient's satisfaction.     Jenel Lucks

## 2022-01-19 NOTE — Transfer of Care (Signed)
Immediate Anesthesia Transfer of Care Note  Patient: Derek French  Procedure(s) Performed: ESOPHAGOGASTRODUODENOSCOPY (EGD) BIOPSY  Patient Location: PACU  Anesthesia Type:MAC  Level of Consciousness: awake, alert , and oriented  Airway & Oxygen Therapy: Patient Spontanous Breathing and Patient connected to nasal cannula oxygen  Post-op Assessment: Report given to RN, Post -op Vital signs reviewed and stable, and Patient moving all extremities X 4  Post vital signs: Reviewed and stable  Last Vitals:  Vitals Value Taken Time  BP 110/84   Temp    Pulse 98 01/19/22 0904  Resp 12 01/19/22 0904  SpO2 93% 01/19/22 0904  Vitals shown include unvalidated device data.  Last Pain:  Vitals:   01/19/22 0746  TempSrc: Temporal  PainSc: 7       Patients Stated Pain Goal: 4 (22/29/79 8921)  Complications: No notable events documented.

## 2022-01-19 NOTE — Anesthesia Procedure Notes (Signed)
Procedure Name: MAC Date/Time: 01/19/2022 8:30 AM  Performed by: Harden Mo, CRNAPre-anesthesia Checklist: Patient identified, Emergency Drugs available, Suction available and Patient being monitored Patient Re-evaluated:Patient Re-evaluated prior to induction Oxygen Delivery Method: Nasal cannula Preoxygenation: Pre-oxygenation with 100% oxygen Induction Type: IV induction Placement Confirmation: positive ETCO2 and breath sounds checked- equal and bilateral Dental Injury: Teeth and Oropharynx as per pre-operative assessment

## 2022-01-19 NOTE — Anesthesia Preprocedure Evaluation (Signed)
Anesthesia Evaluation  Patient identified by MRN, date of birth, ID band Patient awake    Reviewed: Allergy & Precautions, NPO status , Patient's Chart, lab work & pertinent test results  Airway Mallampati: I  TM Distance: >3 FB Neck ROM: Full    Dental  (+) Poor Dentition, Dental Advisory Given, Chipped,    Pulmonary Current Smoker and Patient abstained from smoking.   Pulmonary exam normal breath sounds clear to auscultation       Cardiovascular hypertension, Pt. on medications + Valvular Problems/Murmurs (mod MR) MR  Rhythm:Regular Rate:Normal + Systolic murmurs TTE 2876 - Left ventricle: The cavity size was normal. Systolic function was    normal. The estimated ejection fraction was in the range of 55%    to 60%. Wall motion was normal; there were no regional wall    motion abnormalities. Doppler parameters are consistent with    abnormal left ventricular relaxation (grade 1 diastolic    dysfunction). Doppler parameters are consistent with    indeterminate ventricular filling pressure.  - Aortic valve: Transvalvular velocity was within the normal range.    There was no stenosis. There was moderate regurgitation.    Regurgitation pressure half-time: 332 ms.  - Mitral valve: Transvalvular velocity was within the normal range.    There was no evidence for stenosis. There was no regurgitation.  - Right ventricle: The cavity size was normal. Wall thickness was    normal. Systolic function was normal.  - Atrial septum: No defect or patent foramen ovale was identified.  - Tricuspid valve: There was no regurgitation.  - Inferior vena cava: The vessel was normal in size. The    respirophasic diameter changes were in the normal range (>= 50%     Neuro/Psych  Headaches CVA, No Residual Symptoms  negative psych ROS   GI/Hepatic negative GI ROS,,,(+)     substance abuse  cocaine use  Endo/Other  negative endocrine ROS     Renal/GU negative Renal ROS  negative genitourinary   Musculoskeletal negative musculoskeletal ROS (+)    Abdominal   Peds  Hematology  (+) Blood dyscrasia, anemia   Anesthesia Other Findings   Reproductive/Obstetrics                             Anesthesia Physical Anesthesia Plan  ASA: 3  Anesthesia Plan: MAC   Post-op Pain Management: Minimal or no pain anticipated   Induction: Intravenous  PONV Risk Score and Plan: Propofol infusion and Treatment may vary due to age or medical condition  Airway Management Planned: Natural Airway  Additional Equipment:   Intra-op Plan:   Post-operative Plan:   Informed Consent: I have reviewed the patients History and Physical, chart, labs and discussed the procedure including the risks, benefits and alternatives for the proposed anesthesia with the patient or authorized representative who has indicated his/her understanding and acceptance.     Dental advisory given  Plan Discussed with: CRNA  Anesthesia Plan Comments:        Anesthesia Quick Evaluation

## 2022-01-19 NOTE — Anesthesia Postprocedure Evaluation (Signed)
Anesthesia Post Note  Patient: Derek French  Procedure(s) Performed: COLONOSCOPY WITH PROPOFOL     Patient location during evaluation: Endoscopy Anesthesia Type: MAC Level of consciousness: awake and alert Pain management: pain level controlled Vital Signs Assessment: post-procedure vital signs reviewed and stable Respiratory status: spontaneous breathing, nonlabored ventilation, respiratory function stable and patient connected to nasal cannula oxygen Cardiovascular status: blood pressure returned to baseline and stable Postop Assessment: no apparent nausea or vomiting Anesthetic complications: no  No notable events documented.  Last Vitals:  Vitals:   01/19/22 0616 01/19/22 0746  BP: (!) 141/86 (!) 154/87  Pulse: 75 69  Resp: 17 19  Temp: 36.8 C 36.4 C  SpO2: 96% 96%    Last Pain:  Vitals:   01/19/22 0746  TempSrc: Temporal  PainSc: 7                  Iyona Pehrson L Courtne Lighty

## 2022-01-20 ENCOUNTER — Other Ambulatory Visit (HOSPITAL_COMMUNITY): Payer: Self-pay

## 2022-01-20 DIAGNOSIS — I10 Essential (primary) hypertension: Secondary | ICD-10-CM

## 2022-01-20 LAB — CBC
HCT: 34.7 % — ABNORMAL LOW (ref 39.0–52.0)
Hemoglobin: 12.3 g/dL — ABNORMAL LOW (ref 13.0–17.0)
MCH: 32.9 pg (ref 26.0–34.0)
MCHC: 35.4 g/dL (ref 30.0–36.0)
MCV: 92.8 fL (ref 80.0–100.0)
Platelets: 240 10*3/uL (ref 150–400)
RBC: 3.74 MIL/uL — ABNORMAL LOW (ref 4.22–5.81)
RDW: 13.2 % (ref 11.5–15.5)
WBC: 7.8 10*3/uL (ref 4.0–10.5)
nRBC: 0 % (ref 0.0–0.2)

## 2022-01-20 LAB — SURGICAL PATHOLOGY

## 2022-01-22 ENCOUNTER — Encounter (HOSPITAL_COMMUNITY): Payer: Self-pay | Admitting: Gastroenterology

## 2022-01-22 ENCOUNTER — Telehealth: Payer: Self-pay

## 2022-01-22 NOTE — Telephone Encounter (Signed)
Transition Care Management Follow-up Telephone Call Date of discharge and from where: [facility] 01/20/2022 How have you been since you were released from the hospital? Some pain but better Any questions or concerns? No  Items Reviewed: Did the pt receive and understand the discharge instructions provided? Yes  Medications obtained and verified? Yes  Other? No  Any new allergies since your discharge? No  Dietary orders reviewed? Yes Do you have support at home? Yes   Home Care and Equipment/Supplies: Were home health services ordered? no If so, what is the name of the agency? N/a  Has the agency set up a time to come to the patient's home? not applicable Were any new equipment or medical supplies ordered?  No What is the name of the medical supply agency? N/a Were you able to get the supplies/equipment? not applicable Do you have any questions related to the use of the equipment or supplies? No  Functional Questionnaire: (I = Independent and D = Dependent) ADLs: I  Bathing/Dressing- I  Meal Prep- I  Eating- I  Maintaining continence- I  Transferring/Ambulation- I  Managing Meds- I  Follow up appointments reviewed:  PCP Hospital f/u appt confirmed? Yes  Scheduled to see Vonna Drafts on 01/25/2022 @ 2:50. Specialist Hospital f/u appt confirmed? No  Are transportation arrangements needed? No  If their condition worsens, is the pt aware to call PCP or go to the Emergency Dept.? Yes Was the patient provided with contact information for the PCP's office or ED? Yes Was to pt encouraged to call back with questions or concerns? Yes .lh

## 2022-01-25 ENCOUNTER — Encounter: Payer: Self-pay | Admitting: Family Medicine

## 2022-01-25 ENCOUNTER — Ambulatory Visit (INDEPENDENT_AMBULATORY_CARE_PROVIDER_SITE_OTHER): Payer: Self-pay | Admitting: Family Medicine

## 2022-01-25 VITALS — BP 142/94 | HR 84 | Wt 165.4 lb

## 2022-01-25 DIAGNOSIS — Z1159 Encounter for screening for other viral diseases: Secondary | ICD-10-CM | POA: Insufficient documentation

## 2022-01-25 DIAGNOSIS — D500 Iron deficiency anemia secondary to blood loss (chronic): Secondary | ICD-10-CM

## 2022-01-25 DIAGNOSIS — Z23 Encounter for immunization: Secondary | ICD-10-CM

## 2022-01-25 DIAGNOSIS — I10 Essential (primary) hypertension: Secondary | ICD-10-CM

## 2022-01-25 DIAGNOSIS — Z122 Encounter for screening for malignant neoplasm of respiratory organs: Secondary | ICD-10-CM

## 2022-01-25 DIAGNOSIS — F17210 Nicotine dependence, cigarettes, uncomplicated: Secondary | ICD-10-CM

## 2022-01-25 DIAGNOSIS — K269 Duodenal ulcer, unspecified as acute or chronic, without hemorrhage or perforation: Secondary | ICD-10-CM

## 2022-01-25 DIAGNOSIS — R634 Abnormal weight loss: Secondary | ICD-10-CM

## 2022-01-25 HISTORY — DX: Encounter for screening for other viral diseases: Z11.59

## 2022-01-25 MED ORDER — NICOTINE 14 MG/24HR TD PT24: 14.0000 mg | MEDICATED_PATCH | Freq: Every day | TRANSDERMAL | 0 refills | Status: DC

## 2022-01-25 NOTE — Assessment & Plan Note (Addendum)
142/94 today.  Patient states he may be a little nervous.  On amlodipine 5 mg daily which he has been taking since being in the hospital.  Given that this was just restarted, and patient has not been experiencing any severe symptoms such as headaches, vomiting, will encourage patient to keep track of blood pressure at home and follow-up in 1 month.

## 2022-01-25 NOTE — Assessment & Plan Note (Addendum)
Has been taking his Protonix as prescribed.  Minimal repeat bleeding since leaving the hospital, still has some residual abdominal discomfort.  Planning to follow-up with GI.  Will recheck CBC today to look for worsening anemia.

## 2022-01-25 NOTE — Assessment & Plan Note (Signed)
Greater than 20 pack year history, will order low-dose CT for lung cancer screening

## 2022-01-25 NOTE — Progress Notes (Addendum)
SUBJECTIVE:   CHIEF COMPLAINT / HPI:   [facility]U - admitted 11/25 - 11/29 for GI bleed. EGD with nonbleeding duodenal ulcer, sent home w/ protonix x8 weeks  No issues since leaving the hospital, minimal repeat bleeding. Has been taking all meds as prescribed. Planning to reach out to GI for f/u appt  Smokes about 1 pack every 3 days, off and on for >20 years. Nicotine patch has helped in the past but he hasn't been able to get it from the pharmacy.  Denies severe headaches, dizziness, n/v.   States that he feels he lost some weight in the hospital due to being sick. Has not noticed significant weight loss since leaving the hospital.   Copied from DC Summary: PCP Follow-up: 1) Restarted statin 2) Restarted BP med 3) Consider CT for lung cancer screening 4) Encourage smoking cessation as willing. 5) Colonoscopy recommended in 3 years (12/2024) 6) Patient not taking aspirin initially, please discuss with the patient.  7) Needs CBC at hospital follow up appointment    PERTINENT  PMH / PSH:   Past Medical History:  Diagnosis Date   Anemia due to GI blood loss 01/16/2022   Cocaine use    Encounter for hepatitis C screening test for low risk patient 01/25/2022   Headache(784.0)    Hypertension 05/03/2019   Recurrent boils    Stroke-like episode    07/2009, 03/2011, 09/2011    OBJECTIVE:  BP (!) 142/94   Pulse 84   Wt 165 lb 6 oz (75 kg)   SpO2 98%   BMI 20.67 kg/m   General: NAD, pleasant, able to participate in exam Cardiac: RRR, no murmurs auscultated Respiratory: CTAB, normal WOB, no wheezes appreciated Abdomen: soft, non-distended, normoactive bowel sounds. Mild discomfort to palpation of epigastric region Extremities: warm and well perfused, no edema or cyanosis Skin: warm and dry, no rashes noted Neuro: alert, no obvious focal deficits, speech normal Psych: Normal affect and mood  ASSESSMENT/PLAN:   Duodenal ulcer Assessment & Plan: Has been taking his Protonix as  prescribed.  Minimal repeat bleeding since leaving the hospital, still has some residual abdominal discomfort.  Planning to follow-up with GI.  Will recheck CBC today to look for worsening anemia.  Orders: -     CBC  Cigarette smoker Assessment & Plan: Greater than 20 pack year history, will order low-dose CT for lung cancer screening  Orders: -     Nicotine; Place 1 patch (14 mg total) onto the skin daily. Use a new 14 mg patch every morning for 6 weeks. Then use new 7 mg patches every morning for 2 weeks  Dispense: 28 patch; Refill: 0  Encounter for hepatitis C screening test for low risk patient -     Hepatitis C antibody  Need for immunization against influenza -     Flu Vaccine QUAD 48mo+IM (Fluarix, Fluzone & Alfiuria Quad PF)  Screening for lung cancer -     CT CHEST LUNG CANCER SCREENING LOW DOSE WO CONTRAST; Future  Anemia due to GI blood loss -     CBC  Hypertension, unspecified type Assessment & Plan: 142/94 today.  Patient states he may be a little nervous.  On amlodipine 5 mg daily which he has been taking since being in the hospital.  Given that this was just restarted, and patient has not been experiencing any severe symptoms such as headaches, vomiting, will encourage patient to keep track of blood pressure at home and follow-up in 1 month.  Weight loss Assessment & Plan: Patient states that he lost weight while in the hospital.  Does not feel that the hospital bed weights are accurate however.  He does not remember being weighed in the hospital.  States that he notices his clothes being little bit looser and is working on gaining weight. Reassuringly, colonoscopy was unremarkable aside from diverticulosis. Scheduling low dose CT as above. Will f/u with GI soon and see him back in 1 month for weight check and BP mgmt.    Meds ordered this encounter  Medications   nicotine (NICODERM CQ - DOSED IN MG/24 HOURS) 14 mg/24hr patch    Sig: Place 1 patch (14 mg total)  onto the skin daily. Use a new 14 mg patch every morning for 6 weeks. Then use new 7 mg patches every morning for 2 weeks    Dispense:  28 patch    Refill:  0   Return in about 1 month (around 02/25/2022) for blood pressure.  Vonna Drafts, MD [facility]

## 2022-01-25 NOTE — Assessment & Plan Note (Addendum)
Patient states that he lost weight while in the hospital.  Does not feel that the hospital bed weights are accurate however.  He does not remember being weighed in the hospital.  States that he notices his clothes being little bit looser and is working on gaining weight. Reassuringly, colonoscopy was unremarkable aside from diverticulosis. Scheduling low dose CT as above. Will f/u with GI soon and see him back in 1 month for weight check and BP mgmt.

## 2022-01-25 NOTE — Patient Instructions (Addendum)
It was wonderful to see you today.  Please bring ALL of your medications with you to every visit.   Updates from today's visit:  Please keep track of your blood pressure at home  Please continue to take all of your medications as prescribed.  Please call to schedule a follow-up appointment with GI as soon as possible  I will give you a call if any of your results from today are abnormal  Please follow up in 1 months   Thank you for choosing [facility].   Please call [phone] with any questions about today's appointment.  Please be sure to schedule follow up at the front  desk before you leave today.   Vonna Drafts, MD  Family Medicine    Blood Pressure Record Sheet To take your blood pressure, you will need a blood pressure machine. You can buy a blood pressure machine (blood pressure monitor) at your clinic, drug store, or online. When choosing one, consider: An automatic monitor that has an arm cuff. A cuff that wraps snugly around your upper arm. You should be able to fit only one finger between your arm and the cuff. A device that stores blood pressure reading results. Do not choose a monitor that measures your blood pressure from your wrist or finger. Follow your health care provider's instructions for how to take your blood pressure. To use this form: Take your blood pressure medications every day These measurements should be taken when you have been at rest for at least 10-15 min Take at least 2 readings with each blood pressure check. This makes sure the results are correct. Wait 1-2 minutes between measurements. Write down the results in the spaces on this form. Keep in mind it should always be recorded systolic over diastolic. Both numbers are important.  Repeat this every day for 2-3 weeks, or as told by your health care provider.  Make a follow-up appointment with your health care provider to discuss the results.  Blood Pressure Log Date  Medications taken? (Y/N) Blood Pressure Time of Day

## 2022-01-26 LAB — CBC
Hematocrit: 34.9 % — ABNORMAL LOW (ref 37.5–51.0)
Hemoglobin: 12.1 g/dL — ABNORMAL LOW (ref 13.0–17.7)
MCH: 32.4 pg (ref 26.6–33.0)
MCHC: 34.7 g/dL (ref 31.5–35.7)
MCV: 94 fL (ref 79–97)
Platelets: 329 10*3/uL (ref 150–450)
RBC: 3.73 x10E6/uL — ABNORMAL LOW (ref 4.14–5.80)
RDW: 12.8 % (ref 11.6–15.4)
WBC: 7.2 10*3/uL (ref 3.4–10.8)

## 2022-01-26 LAB — HEPATITIS C ANTIBODY: Hep C Virus Ab: NONREACTIVE

## 2022-01-26 NOTE — Progress Notes (Signed)
Derek French,  The biopsies taken during your upper endoscopy showed inflammatory changes of the duodenum called peptic duodenitis.  This could be secondary to stomach acid or medications such as NSAIDs (Advil, Motrin, etc.). The biopsies of your stomach were unremarkable.  The bacteria H. pylori was not present. I recommend you continue to take Protonix twice a day for total of 8 weeks, then decrease to once daily.  This medication helps the ulcers heal and prevent recurrent ulcers. I would recommend you follow-up with Dr. Myrtie Neither in our clinic to discuss whether repeat upper endoscopy is necessary and to discuss long-term management of your Protonix. Our office will reach out to you to schedule a follow up visit.

## 2022-01-29 ENCOUNTER — Telehealth: Payer: Self-pay

## 2022-01-29 NOTE — Telephone Encounter (Signed)
-----   Message from Jenel Lucks, MD sent at 01/29/2022  4:31 PM EST ----- Regarding: outpt follow up HD,  This is a patient you initially saw in hospital with RLQ and hematochezia.  I did a colonoscopy on him and saw copious maroon blood and clots throughout the colon, but no bleeding source in the colon, and there was blood in the T.I.   Did an EGD the next day and he had duodenitis and a healing duodenal ulcer.  I suspect this was the source but there were no bleeding stigmata which one would expect if it was bleeding the day prior.  Hgb remained stable and bleeding subsided. May be best to follow up with him in a few weeks and see how his symptoms are doing.  Consider VCE if ongoing bleeding or pain.   Laurette Villescas,  Can you get a follow up visit scheduled?  Thanks,  [place]

## 2022-01-29 NOTE — Telephone Encounter (Signed)
Patient has already been scheduled for a follow up with Dr. Myrtie Neither on Wednesday, 02/24/22 at 11 am.

## 2022-02-08 ENCOUNTER — Ambulatory Visit (HOSPITAL_COMMUNITY): Admission: RE | Admit: 2022-02-08 | Payer: Self-pay | Source: Ambulatory Visit

## 2022-02-24 ENCOUNTER — Ambulatory Visit: Payer: Self-pay | Admitting: Gastroenterology

## 2022-03-01 ENCOUNTER — Other Ambulatory Visit: Payer: Self-pay

## 2022-03-01 DIAGNOSIS — E785 Hyperlipidemia, unspecified: Secondary | ICD-10-CM

## 2022-03-01 DIAGNOSIS — I10 Essential (primary) hypertension: Secondary | ICD-10-CM

## 2022-03-01 MED ORDER — ATORVASTATIN CALCIUM 40 MG PO TABS: 40.0000 mg | ORAL_TABLET | Freq: Every day | ORAL | 3 refills | Status: DC

## 2022-03-01 MED ORDER — PANTOPRAZOLE SODIUM 40 MG PO TBEC: 40.0000 mg | DELAYED_RELEASE_TABLET | Freq: Two times a day (BID) | ORAL | 0 refills | Status: DC

## 2022-03-01 MED ORDER — AMLODIPINE BESYLATE 5 MG PO TABS: 5.0000 mg | ORAL_TABLET | Freq: Every day | ORAL | 3 refills | Status: DC

## 2022-03-02 NOTE — Progress Notes (Deleted)
  SUBJECTIVE:   CHIEF COMPLAINT / HPI:   [facility]N f/u -BP elevated at last visit. On amlodipine 5mg  qd. Asked to keep BP log at home ***  PERTINENT  PMH / PSH: ***  Past Medical History:  Diagnosis Date   Anemia due to GI blood loss 01/16/2022   Cocaine use    Encounter for hepatitis C screening test for low risk patient 01/25/2022   Headache(784.0)    Hypertension 05/03/2019   Recurrent boils    Stroke-like episode    07/2009, 03/2011, 09/2011    OBJECTIVE:  There were no vitals taken for this visit.  General: NAD, pleasant, able to participate in exam Cardiac: RRR, no murmurs auscultated Respiratory: CTAB, normal WOB Abdomen: soft, non-tender, non-distended, normoactive bowel sounds Extremities: warm and well perfused, no edema or cyanosis Skin: warm and dry, no rashes noted Neuro: alert, no obvious focal deficits, speech normal Psych: Normal affect and mood  ASSESSMENT/PLAN:   There are no diagnoses linked to this encounter. No orders of the defined types were placed in this encounter.  No follow-ups on file.  August Albino, MD [place] [facility]

## 2022-03-05 ENCOUNTER — Ambulatory Visit: Payer: Self-pay | Admitting: Family Medicine

## 2022-08-30 ENCOUNTER — Observation Stay (HOSPITAL_COMMUNITY)
Admission: EM | Admit: 2022-08-30 | Discharge: 2022-08-31 | Disposition: A | Discharge status: Home / Self Care | Payer: 59 | Attending: Family Medicine | Admitting: Family Medicine

## 2022-08-30 ENCOUNTER — Observation Stay (HOSPITAL_COMMUNITY): Payer: 59

## 2022-08-30 ENCOUNTER — Emergency Department (HOSPITAL_COMMUNITY): Payer: 59

## 2022-08-30 ENCOUNTER — Encounter (HOSPITAL_COMMUNITY): Payer: Self-pay

## 2022-08-30 ENCOUNTER — Other Ambulatory Visit: Payer: Self-pay

## 2022-08-30 DIAGNOSIS — E785 Hyperlipidemia, unspecified: Secondary | ICD-10-CM

## 2022-08-30 DIAGNOSIS — I10 Essential (primary) hypertension: Secondary | ICD-10-CM | POA: Insufficient documentation

## 2022-08-30 DIAGNOSIS — R072 Precordial pain: Secondary | ICD-10-CM

## 2022-08-30 DIAGNOSIS — J9811 Atelectasis: Secondary | ICD-10-CM | POA: Insufficient documentation

## 2022-08-30 DIAGNOSIS — Z8711 Personal history of peptic ulcer disease: Secondary | ICD-10-CM | POA: Insufficient documentation

## 2022-08-30 DIAGNOSIS — Z79899 Other long term (current) drug therapy: Secondary | ICD-10-CM | POA: Diagnosis not present

## 2022-08-30 DIAGNOSIS — R079 Chest pain, unspecified: Secondary | ICD-10-CM

## 2022-08-30 DIAGNOSIS — I351 Nonrheumatic aortic (valve) insufficiency: Secondary | ICD-10-CM | POA: Diagnosis not present

## 2022-08-30 DIAGNOSIS — R0789 Other chest pain: Secondary | ICD-10-CM | POA: Diagnosis not present

## 2022-08-30 DIAGNOSIS — R0989 Other specified symptoms and signs involving the circulatory and respiratory systems: Secondary | ICD-10-CM | POA: Diagnosis not present

## 2022-08-30 DIAGNOSIS — Z720 Tobacco use: Secondary | ICD-10-CM | POA: Insufficient documentation

## 2022-08-30 DIAGNOSIS — R071 Chest pain on breathing: Secondary | ICD-10-CM

## 2022-08-30 DIAGNOSIS — Z87891 Personal history of nicotine dependence: Secondary | ICD-10-CM | POA: Insufficient documentation

## 2022-08-30 DIAGNOSIS — R0781 Pleurodynia: Secondary | ICD-10-CM | POA: Diagnosis not present

## 2022-08-30 DIAGNOSIS — J439 Emphysema, unspecified: Secondary | ICD-10-CM | POA: Insufficient documentation

## 2022-08-30 DIAGNOSIS — Z136 Encounter for screening for cardiovascular disorders: Secondary | ICD-10-CM | POA: Diagnosis not present

## 2022-08-30 HISTORY — DX: Chest pain, unspecified: R07.9

## 2022-08-30 LAB — BASIC METABOLIC PANEL
Anion gap: 9 (ref 5–15)
BUN: 23 mg/dL — ABNORMAL HIGH (ref 6–20)
CO2: 22 mmol/L (ref 22–32)
Calcium: 9 mg/dL (ref 8.9–10.3)
Chloride: 109 mmol/L (ref 98–111)
Creatinine, Ser: 1.16 mg/dL (ref 0.61–1.24)
GFR, Estimated: >60 mL/min (ref >60)
Glucose, Bld: 96 mg/dL (ref 70–99)
Potassium: 4.1 mmol/L (ref 3.5–5.1)
Sodium: 140 mmol/L (ref 135–145)

## 2022-08-30 LAB — CBC
HCT: 44.8 % (ref 39.0–52.0)
HCT: 44.9 % (ref 39.0–52.0)
Hemoglobin: 15 g/dL (ref 13.0–17.0)
Hemoglobin: 15.1 g/dL (ref 13.0–17.0)
MCH: 31.6 pg (ref 26.0–34.0)
MCH: 32.1 pg (ref 26.0–34.0)
MCHC: 33.5 g/dL (ref 30.0–36.0)
MCHC: 33.6 g/dL (ref 30.0–36.0)
MCV: 94.3 fL (ref 80.0–100.0)
MCV: 95.3 fL (ref 80.0–100.0)
Platelets: 292 10*3/uL (ref 150–400)
Platelets: 282 10*3/uL (ref 150–400)
RBC: 4.75 MIL/uL (ref 4.22–5.81)
RBC: 4.71 MIL/uL (ref 4.22–5.81)
RDW: 14 % (ref 11.5–15.5)
RDW: 13.9 % (ref 11.5–15.5)
WBC: 7.9 10*3/uL (ref 4.0–10.5)
WBC: 7.1 10*3/uL (ref 4.0–10.5)
nRBC: 0 % (ref 0.0–0.2)
nRBC: 0 % (ref 0.0–0.2)

## 2022-08-30 LAB — D-DIMER, QUANTITATIVE: D-Dimer, Quant: 0.28 ug/mL-FEU (ref 0.00–0.50)

## 2022-08-30 LAB — TROPONIN I (HIGH SENSITIVITY)
Troponin I (High Sensitivity): 6 ng/L (ref <18)
Troponin I (High Sensitivity): 6 ng/L (ref <18)

## 2022-08-30 LAB — RAPID URINE DRUG SCREEN, HOSP PERFORMED
Amphetamines: NOT DETECTED
Barbiturates: NOT DETECTED
Benzodiazepines: NOT DETECTED
Cocaine: NOT DETECTED
Opiates: POSITIVE — AB
Tetrahydrocannabinol: NOT DETECTED

## 2022-08-30 LAB — HEPATIC FUNCTION PANEL
ALT: 17 U/L (ref 0–44)
AST: 18 U/L (ref 15–41)
Albumin: 3.5 g/dL (ref 3.5–5.0)
Alkaline Phosphatase: 46 U/L (ref 38–126)
Bilirubin, Direct: <0.1 mg/dL (ref 0.0–0.2)
Total Bilirubin: 0.7 mg/dL (ref 0.3–1.2)
Total Protein: 6.6 g/dL (ref 6.5–8.1)

## 2022-08-30 LAB — URINALYSIS, COMPLETE (UACMP) WITH MICROSCOPIC
Bacteria, UA: NONE SEEN
Bilirubin Urine: NEGATIVE
Glucose, UA: NEGATIVE mg/dL
Hgb urine dipstick: NEGATIVE
Ketones, ur: NEGATIVE mg/dL
Leukocytes,Ua: NEGATIVE
Nitrite: NEGATIVE
Protein, ur: NEGATIVE mg/dL
Specific Gravity, Urine: 1.042 — ABNORMAL HIGH (ref 1.005–1.030)
pH: 6 (ref 5.0–8.0)

## 2022-08-30 LAB — LIPASE, BLOOD: Lipase: 35 U/L (ref 11–51)

## 2022-08-30 MED ORDER — OXYCODONE HCL 5 MG PO TABS
5.0000 mg | ORAL_TABLET | Freq: Four times a day (QID) | ORAL | Status: DC | PRN
  Administered 2022-08-30 – 2022-08-31 (×2): 5 mg via ORAL
  Filled 2022-08-30 (×2): qty 1

## 2022-08-30 MED ORDER — IOHEXOL 350 MG/ML SOLN
75.0000 mL | Freq: Once | INTRAVENOUS | Status: AC | PRN
  Administered 2022-08-30: 75 mL via INTRAVENOUS

## 2022-08-30 MED ORDER — PANTOPRAZOLE SODIUM 40 MG IV SOLR
40.0000 mg | Freq: Two times a day (BID) | INTRAVENOUS | Status: DC
  Administered 2022-08-30 – 2022-08-31 (×3): 40 mg via INTRAVENOUS
  Filled 2022-08-30 (×3): qty 10

## 2022-08-30 MED ORDER — ENOXAPARIN SODIUM 40 MG/0.4ML IJ SOSY
40.0000 mg | PREFILLED_SYRINGE | INTRAMUSCULAR | Status: DC
  Administered 2022-08-30: 40 mg via SUBCUTANEOUS
  Filled 2022-08-30: qty 0.4

## 2022-08-30 MED ORDER — ACETAMINOPHEN 325 MG PO TABS: 650.0000 mg | ORAL_TABLET | Freq: Four times a day (QID) | ORAL | Status: DC | PRN

## 2022-08-30 MED ORDER — ONDANSETRON HCL 4 MG/2ML IJ SOLN: 4.0000 mg | Freq: Four times a day (QID) | INTRAMUSCULAR | Status: DC | PRN

## 2022-08-30 MED ORDER — ACETAMINOPHEN 650 MG RE SUPP: 650.0000 mg | Freq: Four times a day (QID) | RECTAL | Status: DC | PRN

## 2022-08-30 MED ORDER — DICLOFENAC SODIUM 1 % EX GEL
4.0000 g | Freq: Two times a day (BID) | CUTANEOUS | Status: DC
  Administered 2022-08-30 – 2022-08-31 (×3): 4 g via TOPICAL
  Filled 2022-08-30: qty 100

## 2022-08-30 MED ORDER — MORPHINE SULFATE (PF) 4 MG/ML IV SOLN
4.0000 mg | Freq: Once | INTRAVENOUS | Status: AC
  Administered 2022-08-30: 4 mg via INTRAVENOUS
  Filled 2022-08-30: qty 1

## 2022-08-30 MED ORDER — LIDOCAINE 5 % EX PTCH
1.0000 | MEDICATED_PATCH | CUTANEOUS | Status: DC
  Administered 2022-08-30: 1 via TRANSDERMAL
  Filled 2022-08-30: qty 1

## 2022-08-30 MED ORDER — ONDANSETRON HCL 4 MG PO TABS: 4.0000 mg | ORAL_TABLET | Freq: Four times a day (QID) | ORAL | Status: DC | PRN

## 2022-08-30 MED ORDER — NICOTINE 14 MG/24HR TD PT24
14.0000 mg | MEDICATED_PATCH | Freq: Every day | TRANSDERMAL | Status: DC
  Administered 2022-08-30 – 2022-08-31 (×2): 14 mg via TRANSDERMAL
  Filled 2022-08-30 (×2): qty 1

## 2022-08-30 MED ORDER — LIDOCAINE VISCOUS HCL 2 % MT SOLN
15.0000 mL | Freq: Once | OROMUCOSAL | Status: AC
  Administered 2022-08-30: 15 mL via ORAL
  Filled 2022-08-30: qty 15

## 2022-08-30 MED ORDER — ATORVASTATIN CALCIUM 40 MG PO TABS
40.0000 mg | ORAL_TABLET | Freq: Every day | ORAL | Status: DC
  Administered 2022-08-30: 40 mg via ORAL
  Filled 2022-08-30: qty 1

## 2022-08-30 MED ORDER — POLYETHYLENE GLYCOL 3350 17 G PO PACK: 17.0000 g | PACK | Freq: Every day | ORAL | Status: DC | PRN

## 2022-08-30 MED ORDER — AMLODIPINE BESYLATE 5 MG PO TABS
5.0000 mg | ORAL_TABLET | Freq: Every day | ORAL | Status: DC
  Administered 2022-08-30: 5 mg via ORAL
  Filled 2022-08-30: qty 1

## 2022-08-30 MED ORDER — ALUM & MAG HYDROXIDE-SIMETH 200-200-20 MG/5ML PO SUSP
30.0000 mL | Freq: Once | ORAL | Status: AC
  Administered 2022-08-30: 30 mL via ORAL
  Filled 2022-08-30: qty 30

## 2022-08-30 NOTE — Assessment & Plan Note (Signed)
Sharp L sided CP x1 day worse with deep inspiration. Pain starts at L side and radiates to upper back. Cardiac workup in ED negative for acute cardiac process. Cards saw and signed off on patient, recommended admissions for GI work up. - Admit to FMTS, attending Dr. Deirdre Priest - Start pt on IV protonix 40 mg BID - Abdominal U/S to rule out AAA given patients smoking history - lidocaine patch, Voltaren gel and heat packs for ?costochondritis  - prn oxycodone 5 mg Q6H PRN - Echo ordered per cards  - AM CBC amd CMP  - Repeat EKG AM - Vital signs per floor - Regular diet  - PT/OT to treat - VTE prophylaxis: lovenox - consider CT AP for worsening abdominal pain

## 2022-08-30 NOTE — ED Notes (Addendum)
Expressed concern and to gain clarification/verification that Internal teaching service's residents don't not want pt to be on telemetry. Per Fatima Blank DO, "no telemetry necessary, thanks".  Irving Burton RN

## 2022-08-30 NOTE — ED Provider Notes (Signed)
[place] EMERGENCY DEPARTMENT AT [facility] Provider Note   CSN: [id number] Arrival date & time: 08/30/22  7829     History Chief Complaint  Patient presents with   Chest Pain     Chest Pain  Derek French is a 57 y.o. male presenting for chest pain.   He reports stabbing left-sided chest that started yesterday while at work. Pain is worse when he takes in deep breadth and radiates to the back. He has never had this before.  He he denies any recent trauma.  He also denies, nausea, vomiting cough, fevers headaches.  He denies recent alcohol use. He stopped drinking after he was diagnosed with ulcers last year.   patient's recorded medical, surgical, social, medication list and allergies were reviewed in the Snapshot window as part of the initial history.   Review of Systems   Review of Systems  Cardiovascular:  Positive for chest pain.    Physical Exam Updated Vital Signs BP (!) 167/86   Pulse 63   Temp 97.9 F (36.6 C) (Oral)   Resp 20   Ht 6\' 3"  (1.905 m)   Wt 74.8 kg   SpO2 97%   BMI 20.62 kg/m  Physical Exam General: Pleasant,, not in acute distress.   CV: RRR. No murmurs, rubs, or gallops. No LE edema Pulmonary: Lungs CTAB. Normal effort. No wheezing or rales. Abdominal: Soft, nontender, nondistended. Normal bowel sounds. Extremities: Palpable pulses. Normal ROM.  ED Course/ Medical Decision Making/ A&P    Procedures Procedures   Medications Ordered in ED Medications  alum & mag hydroxide-simeth (MAALOX/MYLANTA) 200-200-20 MG/5ML suspension 30 mL (30 mLs Oral Given 08/30/22 0822)    And  lidocaine (XYLOCAINE) 2 % viscous mouth solution 15 mL (15 mLs Oral Given 08/30/22 5621)  morphine (PF) 4 MG/ML injection 4 mg (4 mg Intravenous Given 08/30/22 3086)  morphine (PF) 4 MG/ML injection 4 mg (4 mg Intravenous Given 08/30/22 1005)  iohexol (OMNIPAQUE) 350 MG/ML injection 75 mL (75 mLs Intravenous Contrast Given 08/30/22 1010)    Medical Decision  Making:    Derek French is a 57 y.o. male who presented to the ED today with chest pain detailed above.     Patient placed on continuous vitals and telemetry monitoring while in ED which was reviewed periodically.   Complete initial physical exam performed, notably the patient  was stable. Cardiovascular exam was normal.   Reviewed and confirmed nursing documentation for past medical history, family history, social history.    Initial Assessment:   With the patient's presentation of chest pain, most likely diagnosis is ACS vs. aortic dissection vs PE. Other diagnoses were considered including Gastritis. These are considered less likely due to history of present illness and physical exam findings.   This is most consistent with an acute complicated illness  Initial Plan:  D-dimer Screening labs including CBC and Metabolic panel to evaluate for infectious or metabolic etiology of disease.  CXR to evaluate for structural/infectious intrathoracic pathology.  EKG to evaluate for cardiac pathology. Morphine 4 mg for pain.    Objective evaluation as below reviewed with plan for close reassessment  Initial Study Results:   Laboratory  All laboratory results reviewed without evidence of clinically relevant pathology.    EKG was reviewed independently. Rate, rhythm, axis, intervals all examined and without medically relevant abnormality. ST segments without concerns for elevations.    Radiology  All images reviewed independently. Agree with radiology report at this time.  CT Angio Chest PE W and/or Wo Contrast  Result Date: 08/30/2022 CLINICAL DATA:  Lambert Mody left-sided chest pain. EXAM: CT ANGIOGRAPHY CHEST WITH CONTRAST TECHNIQUE: Multidetector CT imaging of the chest was performed using the standard protocol during bolus administration of intravenous contrast. Multiplanar CT image reconstructions and MIPs were obtained to evaluate the vascular anatomy. RADIATION DOSE REDUCTION: This exam was  performed according to the departmental dose-optimization program which includes automated exposure control, adjustment of the mA and/or kV according to patient size and/or use of iterative reconstruction technique. CONTRAST:  75mL OMNIPAQUE IOHEXOL 350 MG/ML SOLN COMPARISON:  Prior CTA of the chest, abdomen and pelvis on 01/10/2017 FINDINGS: Cardiovascular: The pulmonary arteries are adequately opacified. There is no evidence of pulmonary embolism. Central pulmonary arteries are of normal caliber. The thoracic aorta is normal in caliber. No significant aortic atherosclerosis. The heart size is normal. No pericardial fluid identified. No visualized calcified coronary artery plaque. Mediastinum/Nodes: No enlarged mediastinal, hilar, or axillary lymph nodes. Thyroid gland, trachea, and esophagus demonstrate no significant findings. Lungs/Pleura: Stable bullous disease in both upper lobes. Atelectasis at both lung bases. There is no evidence of pulmonary edema, consolidation, pneumothorax, nodule or pleural fluid. Upper Abdomen: No acute abnormality. Musculoskeletal: No chest wall abnormality. No acute or significant osseous findings. Review of the MIP images confirms the above findings. IMPRESSION: 1. No evidence of pulmonary embolism. 2. Stable bullous disease in both upper lobes. 3. Atelectasis at both lung bases. Emphysema (ICD10-J43.9). Electronically Signed   By: Irish Lack M.D.   On: 08/30/2022 10:40   DG Chest 2 View  Result Date: 08/30/2022 CLINICAL DATA:  57 year old male with history of chest pain. EXAM: CHEST - 2 VIEW COMPARISON:  Chest x-ray 01/10/2017. FINDINGS: Lung volumes are low. No consolidative airspace disease. No pleural effusions. No pneumothorax. No pulmonary nodule or mass noted. Pulmonary vasculature and the cardiomediastinal silhouette are within normal limits. IMPRESSION: 1. Low lung volumes without radiographic evidence of acute cardiopulmonary disease. Electronically Signed   By:  Trudie Reed M.D.   On: 08/30/2022 07:26     Consults:  Case discussed with cardiology.   Reassessment and Plan:   -Repeat EKG normal. Both D-dimer and troponin normal. -CT of the chest was negative for PE, consolidation or pneumothorax. Evidence of stable bullous disease in both upper lobes.and atelectasis at both lung bases.  Patient was seen by cardiology and recommends admission to family medicine.  Clinical Impression:  1. Chest pain on breathing      Data Unavailable   Final Clinical Impression(s) / ED Diagnoses Final diagnoses:  Chest pain on breathing    Rx / DC Orders ED Discharge Orders     None         Laretta Bolster, MD 08/30/22 1458    Tegeler, Canary Brim, MD 08/31/22 1024

## 2022-08-30 NOTE — Assessment & Plan Note (Addendum)
Sharp L sided CP x1 day worse with deep inspiration. Pain starts at L side and radiates to upper back. Cardiac workup in ED negative for acute cardiac process. Cards saw and signed off on patient, recommended admissions for GI work up. -  Admit to FMTS, attending Dr. Deirdre Priest - -Start pt on IV protonix 40 mg  - Abdominal U/S to rule out AAA given patients smoking history - lidocaine patch, Voltaren gel and heat packs for ?costochondritis  - prn oxycodone  - Echo ordered per cards  - AM CBC amd CMP  - Repeat EKG AM  - Vital signs per floor - Regular diet  - PT/OT to treat - VTE prophylaxis: lovenox

## 2022-08-30 NOTE — Assessment & Plan Note (Signed)
Continue education of cessation.  - nicotine patch ordered

## 2022-08-30 NOTE — Progress Notes (Signed)
Transition of Care [facility]) - Inpatient Brief Assessment   Patient Details  Name: Derek French MRN: [id number] Date of Birth: 1965/03/20  Transition of Care [facility]) CM/SW Contact:    Janae Bridgeman, RN Phone Number: 08/30/2022, 3:49 PM   Clinical Narrative: Patient was admitted for CP.  Patient reviewed and no TOC needs are noted at this time.   Transition of Care Asessment: Insurance and Status: (P) Insurance coverage has been reviewed Patient has primary care physician: (P) Yes Home environment has been reviewed: (P) Yes Prior level of function:: (P) INdependent Prior/Current Home Services: (P) No current home services Social Determinants of Health Reivew: (P) SDOH reviewed no interventions necessary Readmission risk has been reviewed: (P) Yes Transition of care needs: (P) no transition of care needs at this time

## 2022-08-30 NOTE — Progress Notes (Addendum)
CARDIOLOGY CONSULT NOTE  Patient ID: Derek French MRN: [id number] DOB/AGE: 1965-06-17 57 y.o.  Admit date: 08/30/2022 Primary Physician   Elberta Fortis, MD Primary Cardiologist   None Chief Complaint    Chest pain  HPI: The patient is being seen at the request of Dr. Rush Landmark.  He has no history of CAD.  He was in the hospital last year with GI bleeding.  He had a duodenopathy and non bleeding duodenal ulcer.  He presents with chest pain.  He says that this started at 1 AM when he was at work.  It was sharp and hurt to take a deep breath.  It was 8/10 and it is still ongoing.  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  He threw up after this.  He did not have SOB.  He thinks he was somewhat worse lying flat.  He had no radiation to his neck or his arms.  In the EKG he did have some improvement after GI cocktail and morphine but the pain keeps coming back.  In the ED Trop was negative x 2.  EKG today with LVH by voltage criteria and T wave inversion in the inferolateral leads consistent with repol and not different compared to previous EKGs.     I did see an echo from 2013 with moderate LVH but normal LV and RV function.     Past Medical History:  Diagnosis Date   Anemia due to GI blood loss 01/16/2022   Encounter for hepatitis C screening test for low risk patient 01/25/2022   Hypertension 05/03/2019   Recurrent boils    Stroke-like episode    07/2009, 03/2011, 09/2011    Past Surgical History:  Procedure Laterality Date   BIOPSY  01/19/2022   Procedure: BIOPSY;  Surgeon: Jenel Lucks, MD;  Location: [facility] ENDOSCOPY;  Service: Gastroenterology;;   COLONOSCOPY WITH PROPOFOL N/A 01/18/2022   Procedure: COLONOSCOPY WITH PROPOFOL;  Surgeon: Jenel Lucks, MD;  Location: [facility] ENDOSCOPY;  Service: Gastroenterology;  Laterality: N/A;   ESOPHAGOGASTRODUODENOSCOPY N/A 01/19/2022   Procedure: ESOPHAGOGASTRODUODENOSCOPY (EGD);  Surgeon: Jenel Lucks, MD;  Location: [facility] ENDOSCOPY;   Service: Gastroenterology;  Laterality: N/A;   HERNIA REPAIR      Allergies  Allergen Reactions   Bee Venom Anaphylaxis   Codeine Other (See Comments)    hallucinations    No current facility-administered medications on file prior to encounter.   Current Outpatient Medications on File Prior to Encounter  Medication Sig Dispense Refill   amLODipine (NORVASC) 5 MG tablet Take 1 tablet (5 mg total) by mouth at bedtime. 90 tablet 3   atorvastatin (LIPITOR) 40 MG tablet Take 1 tablet (40 mg total) by mouth daily. 90 tablet 3   naproxen sodium (ALEVE) 220 MG tablet Take 440 mg by mouth as needed (pain).     pantoprazole (PROTONIX) 40 MG tablet Take 1 tablet (40 mg total) by mouth 2 (two) times daily. 60 tablet 0   Social History   Socioeconomic History   Marital status: Single    Spouse name: Not on file   Number of children: Not on file   Years of education: Not on file   Highest education level: Not on file  Occupational History   Not on file  Tobacco Use   Smoking status: Every Day    Packs/day: 0.50    Years: 20.00    Additional pack years: 0.00    Total pack years: 10.00    Types: Cigarettes  Start date: 07/04/2001   Smokeless tobacco: Never  Vaping Use   Vaping Use: Never used  Substance and Sexual Activity   Alcohol use: Yes    Alcohol/week: 1.0 standard drink of alcohol    Types: 1 Cans of beer per week    Comment: occ   Drug use: Yes    Types: Marijuana, Cocaine    Comment: relapsed 1 week ago   Sexual activity: Yes    Birth control/protection: Condom  Other Topics Concern   Not on file  Social History Narrative   Lives with mom.  Drives a forklift.  One daughter   Social Determinants of Health   Financial Resource Strain: Not on file  Food Insecurity: No Food Insecurity (01/16/2022)   Hunger Vital Sign    Worried About Running Out of Food in the Last Year: Never true    Ran Out of Food in the Last Year: Never true  Transportation Needs: No  Transportation Needs (01/16/2022)   PRAPARE - Administrator, Civil Service (Medical): No    Lack of Transportation (Non-Medical): No  Physical Activity: Not on file  Stress: Not on file  Social Connections: Not on file  Intimate Partner Violence: Not At Risk (01/16/2022)   Humiliation, Afraid, Rape, and Kick questionnaire    Fear of Current or Ex-Partner: No    Emotionally Abused: No    Physically Abused: No    Sexually Abused: No    Family History  Problem Relation Age of Onset   Stroke Father    Heart disease Father 67       CABG     ROS:  As stated in the HPI and negative for all other systems.  Physical Exam: Blood pressure (!) 167/91, pulse 61, temperature 98 F (36.7 C), resp. rate 18, height 6\' 3"  (1.905 m), weight 74.8 kg, SpO2 99 %.  GENERAL:  Well appearing HEENT:  Pupils equal round and reactive, fundi not visualized, oral mucosa unremarkable NECK:  No jugular venous distention, waveform within normal limits, carotid upstroke brisk and symmetric, no bruits, no thyromegaly LYMPHATICS:  No cervical, inguinal adenopathy LUNGS:  Clear to auscultation bilaterally BACK:  No CVA tenderness CHEST:  Unremarkable HEART:  PMI not displaced or sustained,S1 and S2 within normal limits, no S3, no S4, no clicks, no rubs, no murmurs ABD:  Flat, positive bowel sounds normal in frequency in pitch, no bruits, no rebound, no guarding, no midline pulsatile mass, no hepatomegaly, no splenomegaly EXT:  2 plus pulses throughout, no edema, no cyanosis no clubbing SKIN:  No rashes no nodules NEURO:  Cranial nerves II through XII grossly intact, motor grossly intact throughout PSYCH:  Cognitively intact, oriented to person place and time   Labs: Lab Results  Component Value Date   BUN 23 (H) 08/30/2022   Lab Results  Component Value Date   CREATININE 1.16 08/30/2022   Lab Results  Component Value Date   NA 140 08/30/2022   K 4.1 08/30/2022   CL 109 08/30/2022   CO2  22 08/30/2022   Lab Results  Component Value Date   TROPONINI <0.30 09/26/2011   Lab Results  Component Value Date   WBC 7.9 08/30/2022   HGB 15.0 08/30/2022   HCT 44.8 08/30/2022   MCV 94.3 08/30/2022   PLT 292 08/30/2022   Lab Results  Component Value Date   CHOL 144 01/17/2022   HDL 35 (L) 01/17/2022   LDLCALC 98 01/17/2022   TRIG 53 01/17/2022  CHOLHDL 4.1 01/17/2022   Lab Results  Component Value Date   ALT 16 01/16/2022   AST 20 01/16/2022   ALKPHOS 54 01/16/2022   BILITOT 0.5 01/16/2022      Radiology:  CXR:  NAD.   CT with emphysema.    EKG:  NSR, rate 60 , axis WNL, intervals WNL, T wave inversion in the lateral leads possible repol.    ASSESSMENT AND PLAN:   Chest pain: Pain has non anginal greater than anginal features.   Trop negative.  No acute changes on EKG.  No coronary calcium noted on CT.  No aortic atherosclerosis mentioned.  I do not suspect ACS as the etiology of his pain.   I don't strongly suspect obstructive CAD.  He does have cardiovascular risk factors and these should be addressed.  I might suggest follow up EKG in the AM and I will order an echo.  Consider GI work up.    HTN:  BP is elevated.  He reports that it is controlled at home but likely needs additional meds based on readings in hospital.  I would like to follow up on the LVH previously identified. Echo as above.  Continue Norvasc  Tobacco abuse:  Educated.  Dyslipidemia:  Check lipid profile in AM. Continue Lipitor.   Risk reduction:  Patient borderline A1c.  Plan per primary MD.    Signed: Rollene Rotunda 08/30/2022, 12:16 PM

## 2022-08-30 NOTE — ED Notes (Signed)
ED TO INPATIENT HANDOFF REPORT  ED Nurse Name and Phone #: Dwana Melena [phone]  S Name/Age/Gender Derek French 57 y.o. male Room/Bed: 019C/019C  Code Status   Code Status: Full Code  Home/SNF/Other Home Patient oriented to: self, place, time, and situation Is this baseline? Yes   Triage Complete: Triage complete  Chief Complaint Chest pain [R07.9]  Triage Note Complaining of left sided stabbing chest pain that started around 2 am. Hurts every time he takes a breath.   Allergies Allergies  Allergen Reactions   Bee Venom Anaphylaxis   Codeine Other (See Comments)    hallucinations     Level of Care/Admitting Diagnosis ED Disposition     ED Disposition  Admit   Condition  --   Comment  Hospital Area: [facility] [[id number]]  Level of Care: Med-Surg [16]  May place patient in observation at [facility] or [facility] if equivalent level of care is available:: Yes  Covid Evaluation: Asymptomatic - no recent exposure (last 10 days) testing not required  Diagnosis: Chest pain [454098]  Admitting Physician: Carney Living [[id number]]  Attending Physician: Carney Living [[id number]]          B Medical/Surgery History Past Medical History:  Diagnosis Date   Anemia due to GI blood loss 01/16/2022   Chest pain 08/30/2022   Encounter for hepatitis C screening test for low risk patient 01/25/2022   Hypertension 05/03/2019   Recurrent boils    Stroke-like episode    07/2009, 03/2011, 09/2011   Past Surgical History:  Procedure Laterality Date   BIOPSY  01/19/2022   Procedure: BIOPSY;  Surgeon: Jenel Lucks, MD;  Location: [facility] ENDOSCOPY;  Service: Gastroenterology;;   COLONOSCOPY WITH PROPOFOL N/A 01/18/2022   Procedure: COLONOSCOPY WITH PROPOFOL;  Surgeon: Jenel Lucks, MD;  Location: [facility] ENDOSCOPY;  Service: Gastroenterology;  Laterality: N/A;   ESOPHAGOGASTRODUODENOSCOPY N/A 01/19/2022   Procedure: ESOPHAGOGASTRODUODENOSCOPY (EGD);   Surgeon: Jenel Lucks, MD;  Location: [facility] ENDOSCOPY;  Service: Gastroenterology;  Laterality: N/A;   HERNIA REPAIR       A IV Location/Drains/Wounds Patient Lines/Drains/Airways Status     Active Line/Drains/Airways     Name Placement date Placement time Site Days   Peripheral IV 08/30/22 20 G Anterior;Distal;Upper;Right Arm 08/30/22  0820  Arm  less than 1            Intake/Output Last 24 hours No intake or output data in the 24 hours ending 08/30/22 1426  Labs/Imaging Results for orders placed or performed during the hospital encounter of 08/30/22 (from the past 48 hour(s))  Basic metabolic panel     Status: Abnormal   Collection Time: 08/30/22  7:12 AM  Result Value Ref Range   Sodium 140 135 - 145 mmol/L   Potassium 4.1 3.5 - 5.1 mmol/L   Chloride 109 98 - 111 mmol/L   CO2 22 22 - 32 mmol/L   Glucose, Bld 96 70 - 99 mg/dL    Comment: Glucose reference range applies only to samples taken after fasting for at least 8 hours.   BUN 23 (H) 6 - 20 mg/dL   Creatinine, Ser 1.19 0.61 - 1.24 mg/dL   Calcium 9.0 8.9 - 14.7 mg/dL   GFR, Estimated >82 >95 mL/min    Comment: (NOTE) Calculated using the CKD-EPI Creatinine Equation (2021)    Anion gap 9 5 - 15    Comment: Performed at [facility], [address], [place], [place]  [address]  CBC     Status: None   Collection Time: 08/30/22  7:12 AM  Result Value Ref Range   WBC 7.9 4.0 - 10.5 K/uL   RBC 4.75 4.22 - 5.81 MIL/uL   Hemoglobin 15.0 13.0 - 17.0 g/dL   HCT 60.4 54.0 - 98.1 %   MCV 94.3 80.0 - 100.0 fL   MCH 31.6 26.0 - 34.0 pg   MCHC 33.5 30.0 - 36.0 g/dL   RDW 19.1 47.8 - 29.5 %   Platelets 292 150 - 400 K/uL   nRBC 0.0 0.0 - 0.2 %    Comment: Performed at [facility], [address], [place], [place] [address]  Troponin I (High Sensitivity)     Status: None   Collection Time: 08/30/22  7:12 AM  Result Value Ref Range   Troponin I (High Sensitivity) 6 <18 ng/L    Comment: (NOTE) Elevated  high sensitivity troponin I (hsTnI) values and significant  changes across serial measurements may suggest ACS but many other  chronic and acute conditions are known to elevate hsTnI results.  Refer to the "Links" section for chest pain algorithms and additional  guidance. Performed at [facility], [address], [place], [place] [address]   D-dimer, quantitative     Status: None   Collection Time: 08/30/22  7:56 AM  Result Value Ref Range   D-Dimer, Quant 0.28 0.00 - 0.50 ug/mL-FEU    Comment: (NOTE) At the manufacturer cut-off value of 0.5 g/mL FEU, this assay has a negative predictive value of 95-100%.This assay is intended for use in conjunction with a clinical pretest probability (PTP) assessment model to exclude pulmonary embolism (PE) and deep venous thrombosis (DVT) in outpatients suspected of PE or DVT. Results should be correlated with clinical presentation. Performed at [facility], [address], [place], [place] [address]   Troponin I (High Sensitivity)     Status: None   Collection Time: 08/30/22  9:03 AM  Result Value Ref Range   Troponin I (High Sensitivity) 6 <18 ng/L    Comment: (NOTE) Elevated high sensitivity troponin I (hsTnI) values and significant  changes across serial measurements may suggest ACS but many other  chronic and acute conditions are known to elevate hsTnI results.  Refer to the "Links" section for chest pain algorithms and additional  guidance. Performed at [facility], [address] [place], [place] [address]    CT Angio Chest PE W and/or Wo Contrast  Result Date: 08/30/2022 CLINICAL DATA:  Lambert Mody left-sided chest pain. EXAM: CT ANGIOGRAPHY CHEST WITH CONTRAST TECHNIQUE: Multidetector CT imaging of the chest was performed using the standard protocol during bolus administration of intravenous contrast. Multiplanar CT image reconstructions and MIPs were obtained to evaluate the vascular anatomy. RADIATION DOSE  REDUCTION: This exam was performed according to the departmental dose-optimization program which includes automated exposure control, adjustment of the mA and/or kV according to patient size and/or use of iterative reconstruction technique. CONTRAST:  75mL OMNIPAQUE IOHEXOL 350 MG/ML SOLN COMPARISON:  Prior CTA of the chest, abdomen and pelvis on 01/10/2017 FINDINGS: Cardiovascular: The pulmonary arteries are adequately opacified. There is no evidence of pulmonary embolism. Central pulmonary arteries are of normal caliber. The thoracic aorta is normal in caliber. No significant aortic atherosclerosis. The heart size is normal. No pericardial fluid identified. No visualized calcified coronary artery plaque. Mediastinum/Nodes: No enlarged mediastinal, hilar, or axillary lymph nodes. Thyroid gland, trachea, and esophagus demonstrate no significant  findings. Lungs/Pleura: Stable bullous disease in both upper lobes. Atelectasis at both lung bases. There is no evidence of pulmonary edema, consolidation, pneumothorax, nodule or pleural fluid. Upper Abdomen: No acute abnormality. Musculoskeletal: No chest wall abnormality. No acute or significant osseous findings. Review of the MIP images confirms the above findings. IMPRESSION: 1. No evidence of pulmonary embolism. 2. Stable bullous disease in both upper lobes. 3. Atelectasis at both lung bases. Emphysema (ICD10-J43.9). Electronically Signed   By: Irish Lack M.D.   On: 08/30/2022 10:40   DG Chest 2 View  Result Date: 08/30/2022 CLINICAL DATA:  57 year old male with history of chest pain. EXAM: CHEST - 2 VIEW COMPARISON:  Chest x-ray 01/10/2017. FINDINGS: Lung volumes are low. No consolidative airspace disease. No pleural effusions. No pneumothorax. No pulmonary nodule or mass noted. Pulmonary vasculature and the cardiomediastinal silhouette are within normal limits. IMPRESSION: 1. Low lung volumes without radiographic evidence of acute cardiopulmonary disease.  Electronically Signed   By: Trudie Reed M.D.   On: 08/30/2022 07:26    Pending Labs Unresulted Labs (From admission, onward)     Start     Ordered   08/31/22 0500  Basic metabolic panel  Tomorrow morning,   R        08/30/22 1356   08/31/22 0500  CBC  Tomorrow morning,   R        08/30/22 1356   08/31/22 0500  Lipid panel  Tomorrow morning,   R        08/30/22 1403   08/30/22 1424  Rapid urine drug screen (hospital performed)  ONCE - STAT,   STAT        08/30/22 1423   08/30/22 1354  CBC  (enoxaparin (LOVENOX)    CrCl >/= 30 ml/min)  Once,   R       Comments: Baseline for enoxaparin therapy IF NOT ALREADY DRAWN.  Notify MD if PLT < 100 K.    08/30/22 1356   08/30/22 1305  Lipase, blood  Once,   STAT        08/30/22 1304   08/30/22 1305  Hepatic function panel  Once,   URGENT        08/30/22 1304            Vitals/Pain Today's Vitals   08/30/22 0747 08/30/22 0830 08/30/22 1109 08/30/22 1415  BP:  (!) 167/91    Pulse: 63 61  (!) 55  Resp: 20 18  18   Temp:   98 F (36.7 C)   TempSrc:      SpO2: 97% 99%  98%  Weight:      Height:      PainSc:        Isolation Precautions No active isolations  Medications Medications  enoxaparin (LOVENOX) injection 40 mg (40 mg Subcutaneous Given 08/30/22 1414)  acetaminophen (TYLENOL) tablet 650 mg (has no administration in time range)    Or  acetaminophen (TYLENOL) suppository 650 mg (has no administration in time range)  polyethylene glycol (MIRALAX / GLYCOLAX) packet 17 g (has no administration in time range)  ondansetron (ZOFRAN) tablet 4 mg (has no administration in time range)    Or  ondansetron (ZOFRAN) injection 4 mg (has no administration in time range)  nicotine (NICODERM CQ - dosed in mg/24 hours) patch 14 mg (has no administration in time range)  alum & mag hydroxide-simeth (MAALOX/MYLANTA) 200-200-20 MG/5ML suspension 30 mL (30 mLs Oral Given 08/30/22 1610)    And  lidocaine (XYLOCAINE)  2 % viscous mouth solution  15 mL (15 mLs Oral Given 08/30/22 0822)  morphine (PF) 4 MG/ML injection 4 mg (4 mg Intravenous Given 08/30/22 0822)  morphine (PF) 4 MG/ML injection 4 mg (4 mg Intravenous Given 08/30/22 1005)  iohexol (OMNIPAQUE) 350 MG/ML injection 75 mL (75 mLs Intravenous Contrast Given 08/30/22 1010)    Mobility walks     Focused Assessments    R Recommendations: See Admitting Provider Note  Report given to:   Additional Notes:

## 2022-08-30 NOTE — ED Triage Notes (Signed)
Complaining of left sided stabbing chest pain that started around 2 am. Hurts every time he takes a breath.

## 2022-08-30 NOTE — H&P (Addendum)
Hospital Admission History and Physical Service Pager: [phone]  Patient name: Derek French Medical record number: [id number] Date of Birth: 05/30/1965 Age: 57 y.o. Gender: male  Primary Care Provider: Elberta Fortis, MD Consultants: Cardiology Code Status: Full code  Preferred Emergency Contact:  Contact Information     Name Relation Home Work Mobile   Eilan, Matsun[place] Mother [phone]  [phone]   King,Mercedes Daughter [phone]         Chief Complaint: Chest pain  Assessment and Plan: Derek French is a 57 y.o. male PMHx of HTN, HLD, duodenal ulcers who presents with sharp left sided chest pain. Differential for presentation of this includes ACS, pancreatitis, AAA, costochondritis, gastric/duodenal ulcers, pyelo, nephrolithiasis. EKG in ED did no show acute changes indicating ACS, troponins were negative and D-dimer also negative. Chest xray did not show any acute processes, CT chest angio did not indicate PE. Patient has a normal WBC and is afebrile, low suspicion for pyelo or infectious etiology.   Cardiology was consulted who suggested presentation is likely no due to cardiac process and recommended GI work up. Lipase was not elevated. At this time, there is no clear cause for his pain. As patient has smoking history, will work up vascular cause for pain. Patient has been out of protonix for past three weeks, pain may be attributed to acid reflux. As patient has pain on palpation, costochondritis is also a possibility.  Will treat conservatively with lidocaine patch, voltaren gel, heating pad, with oxycodone 5 mg as needed for breakthrough pain.    Hospital Problem List      Hospital     * (Principal) Pleuritic chest pain     Sharp L sided CP x1 day worse with deep inspiration. Pain starts at L  side and radiates to upper back. Cardiac workup in ED negative for acute  cardiac process. Cards saw and signed off on patient, recommended  admissions for GI work  up. - Admit to FMTS, attending Dr. Deirdre Priest - Start pt on IV protonix 40 mg BID - Abdominal U/S to rule out AAA given patients smoking history - lidocaine patch, Voltaren gel and heat packs for ?costochondritis  - prn oxycodone 5 mg Q6H PRN - Echo ordered per cards  - AM CBC amd CMP  - Repeat EKG AM - Vital signs per floor - Regular diet  - PT/OT to treat - VTE prophylaxis: lovenox - consider CT AP for worsening abdominal pain        Tobacco abuse     Continue education of cessation.  - nicotine patch ordered       Stable Chronic Medical Problems:  HTN: continue amlodipine 5 mg daily  HLD: continue Lipitor 40 mg daily   FEN/GI: Full Diet  VTE Prophylaxis: Lovenox   Disposition: Med Surg  History of Present Illness:  Derek French is a 57 y.o. male presenting with sharp L sided chest pain which began last night. 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 Patient states the pain wraps around his back. Last night he tried Catering manager and ginger ale as he thought it was indigestion but this did not help. He states he vomited once but it was because he drank ginger ale. Patient denies dizziness, LOC at time of event. Patient reports pain medicine in the ED helped a bit. Patient denies nausea, lightheadedness, vision changes, fever, abdominal pain or changes with urination.   Patient has a  history of ulcers 11/203, but states this pain is different than the pain he had with his duodenal ulcers and this time has no blood in his stool and has not vomited any blood. Patient states he was put on protonix after he was seen in ED for ulcers but ran out of it 3 weeks ago.   Denies fever or chills at home or sick contacts.   In the ED, a full cardiac work up was done including EKG, Troponin x3, D-dimer, chest xray and CT angio chest. Cardiology was called to evaluate patient and they stated there is no acute cardiac process at this time. Called for  admission for pain management and GI workup.   Review Of Systems: Per HPI with the following additions: as above  Pertinent Past Medical History: Duodenal ulcer  HTN HLD Remainder reviewed in history tab.   Pertinent Past Surgical History: Hernia   Remainder reviewed in history tab.   Pertinent Social History: Tobacco use: Yes, 1 pack every 3 days; +20 years  Alcohol use: denies  Other Substance use: denies, used to do cocaine  Lives with mother   Pertinent Family History: Remainder reviewed in history tab.   Important Outpatient Medications: Norvasc 5 mg Lipitor 40 mg  Protonix 40 mg BID  Remainder reviewed in medication history.   Objective: BP (!) 179/110 (BP Location: Left Arm)   Pulse (!) 55   Temp (!) 97.1 F (36.2 C) (Oral)   Resp 14   Ht 6\' 3"  (1.905 m)   Wt 74.8 kg   SpO2 98%   BMI 20.62 kg/m  Exam: Physical Exam Vitals reviewed.  Constitutional:      Comments: Laying in bed, no diaphoresis.   HENT:     Head: Normocephalic.  Cardiovascular:     Rate and Rhythm: Normal rate and regular rhythm.     Heart sounds: Normal heart sounds.  Pulmonary:     Effort: Pulmonary effort is normal.     Breath sounds: Normal breath sounds.     Comments: Some crackles at lung bases Abdominal:     General: Bowel sounds are normal.     Palpations: Abdomen is soft.  Musculoskeletal:     Comments: TTP at L chest, worse with expiration   Neurological:     General: No focal deficit present.     Mental Status: He is alert and oriented to person, place, and time.      Labs:  CBC BMET  Recent Labs  Lab 08/30/22 1412  WBC 7.1  HGB 15.1  HCT 44.9  PLT 282   Recent Labs  Lab 08/30/22 0712  NA 140  K 4.1  CL 109  CO2 22  BUN 23*  CREATININE 1.16  GLUCOSE 96  CALCIUM 9.0    Pertinent additional labs Lipase     Component Value Date/Time   LIPASE 35 08/30/2022 1324      EKG: My own interpretation (not copied from electronic read)  Sinus rhythm,  regular rate, some diffuse T wave inversions      Imaging Studies Performed:  Chest xray:   FINDINGS: Lung volumes are low. No consolidative airspace disease. No pleural effusions. No pneumothorax. No pulmonary nodule or mass noted. Pulmonary vasculature and the cardiomediastinal silhouette are within normal limits.   IMPRESSION: 1. Low lung volumes without radiographic evidence of acute cardiopulmonary disease.  My interpretation: Patient has no acute process and no consolidations   CT angio chest  IMPRESSION: 1. No evidence of pulmonary  embolism. 2. Stable bullous disease in both upper lobes. 3. Atelectasis at both lung bases.  My interpretation: No evidence of a PE, lower lung volumes at bases, mild atelectasis bilaterally   Glendale Chard, DO 08/30/2022, 3:51 PM PGY-1, [facility] Family Medicine  FPTS Intern pager: [phone], text pages welcome Secure chat group [facility] Teaching Service

## 2022-08-30 NOTE — Hospital Course (Signed)
Derek French is a 57 y.o.male with a history of hypertension, hyperlipidemia, duodenal ulcers who was admitted to the [facility] family medicine teaching Service at [facility] for pleuritic left-sided chest pain. His hospital course is detailed below:  Chest pain Presented with 1 day of left-sided chest pain, worse with inspiration. Full cardiac workup including EKG, troponin x 3, D-dimer, chest x-ray and CT angio chest negative for acute coronary syndrome. An echo was ordered with normal findings.   Admitted for pain management and possible GI workup given history of duodenal ulcers. Spleen was evaluated on CT angiogram which did not show any infarct. As his pain improved with IV Protonix, lidocaine patch, Voltaren gel and as needed pain medication, the thought is he has costochondritis. At time of discharge, he was stable.   Other chronic conditions were medically managed with home medications and formulary alternatives as necessary (hypertension, hyperlipidemia)  PCP Follow-up Recommendations: - f/u on rib pain  - Increased patients Lipitor to 80 mg as per cards rec - increased norvasc to 10 mg as per cards, f/u on BP

## 2022-08-30 NOTE — Progress Notes (Signed)
FMTS Interim Progress Note  S: Went to the bedside for nighttime rounds with Dr. Mliss Sax.  Still reporting a little bit of left-sided pain.  Denies nausea, vomiting.  Denies abdominal pain.  No crushing, squeezing or pressure-like chest pain.  Hurts more when he takes a deep breath.  Ate dinner without difficulty.  O: BP (!) 165/92 (BP Location: Left Arm)   Pulse 62   Temp (!) 97.1 F (36.2 C) (Oral)   Resp 16   Ht 6\' 3"  (1.905 m)   Wt 74.8 kg   SpO2 95%   BMI 20.62 kg/m   General: Pleasant, no distress, watching TV CV: Regular rate and rhythm Respiratory: Breathing on room air MSK: Exquisitely tender to palpation over left mid axillary thoracic area.  A/P: Pleuritic chest pain/abdominal pain Stable.  Per day team, treating symptomatically.  Reviewed labs and orders. -On IV Protonix 40 mg twice daily -As needed lidocaine patch, Voltaren gel and heat pack for possible costochondritis -Oxycodone 5 mg every 6 hours as needed -Should he have worsening abdominal pain, will order CT abdomen pelvis with and without contrast  Darral Dash, DO 08/30/2022, 7:18 PM PGY-3, [facility] Family Medicine Service pager [phone]

## 2022-08-31 ENCOUNTER — Telehealth (HOSPITAL_COMMUNITY): Payer: Self-pay | Admitting: Pharmacy Technician

## 2022-08-31 ENCOUNTER — Other Ambulatory Visit (HOSPITAL_COMMUNITY): Payer: Self-pay

## 2022-08-31 ENCOUNTER — Observation Stay (HOSPITAL_BASED_OUTPATIENT_CLINIC_OR_DEPARTMENT_OTHER): Payer: 59

## 2022-08-31 DIAGNOSIS — Z79899 Other long term (current) drug therapy: Secondary | ICD-10-CM | POA: Diagnosis not present

## 2022-08-31 DIAGNOSIS — R0781 Pleurodynia: Principal | ICD-10-CM

## 2022-08-31 DIAGNOSIS — R079 Chest pain, unspecified: Secondary | ICD-10-CM | POA: Diagnosis not present

## 2022-08-31 DIAGNOSIS — I10 Essential (primary) hypertension: Secondary | ICD-10-CM | POA: Diagnosis not present

## 2022-08-31 DIAGNOSIS — R071 Chest pain on breathing: Secondary | ICD-10-CM

## 2022-08-31 DIAGNOSIS — R072 Precordial pain: Secondary | ICD-10-CM | POA: Diagnosis not present

## 2022-08-31 LAB — ECHOCARDIOGRAM COMPLETE
AR max vel: 2.71 cm2
AV Area VTI: 2.8 cm2
AV Area mean vel: 2.61 cm2
AV Mean grad: 7 mmHg
AV Peak grad: 13.7 mmHg
AV Vena cont: 0.4 cm
Ao pk vel: 1.85 m/s
Area-P 1/2: 2.5 cm2
Height: 75 in
P 1/2 time: 670 msec
S' Lateral: 3.45 cm
Weight: 2832.47 oz

## 2022-08-31 LAB — BASIC METABOLIC PANEL
Anion gap: 6 (ref 5–15)
BUN: 19 mg/dL (ref 6–20)
CO2: 21 mmol/L — ABNORMAL LOW (ref 22–32)
Calcium: 8.6 mg/dL — ABNORMAL LOW (ref 8.9–10.3)
Chloride: 110 mmol/L (ref 98–111)
Creatinine, Ser: 0.97 mg/dL (ref 0.61–1.24)
GFR, Estimated: >60 mL/min (ref >60)
Glucose, Bld: 87 mg/dL (ref 70–99)
Potassium: 4.2 mmol/L (ref 3.5–5.1)
Sodium: 137 mmol/L (ref 135–145)

## 2022-08-31 LAB — CBC
HCT: 43.4 % (ref 39.0–52.0)
Hemoglobin: 14.8 g/dL (ref 13.0–17.0)
MCH: 32 pg (ref 26.0–34.0)
MCHC: 34.1 g/dL (ref 30.0–36.0)
MCV: 93.7 fL (ref 80.0–100.0)
Platelets: 260 10*3/uL (ref 150–400)
RBC: 4.63 MIL/uL (ref 4.22–5.81)
RDW: 13.9 % (ref 11.5–15.5)
WBC: 7.3 10*3/uL (ref 4.0–10.5)
nRBC: 0 % (ref 0.0–0.2)

## 2022-08-31 LAB — LIPID PANEL
Cholesterol: 171 mg/dL (ref 0–200)
HDL: 42 mg/dL (ref >40)
LDL Cholesterol: 122 mg/dL — ABNORMAL HIGH (ref 0–99)
Total CHOL/HDL Ratio: 4.1 RATIO
Triglycerides: 33 mg/dL (ref <150)
VLDL: 7 mg/dL (ref 0–40)

## 2022-08-31 LAB — TROPONIN I (HIGH SENSITIVITY)
Troponin I (High Sensitivity): 6 ng/L (ref <18)
Troponin I (High Sensitivity): 5 ng/L (ref <18)

## 2022-08-31 MED ORDER — ATORVASTATIN CALCIUM 80 MG PO TABS
80.0000 mg | ORAL_TABLET | Freq: Every day | ORAL | Status: DC
  Administered 2022-08-31: 80 mg via ORAL
  Filled 2022-08-31: qty 1

## 2022-08-31 MED ORDER — DICLOFENAC SODIUM 1 % EX GEL: 4.0000 g | Freq: Four times a day (QID) | CUTANEOUS | Status: DC

## 2022-08-31 MED ORDER — AMLODIPINE BESYLATE 10 MG PO TABS
10.0000 mg | ORAL_TABLET | Freq: Every day | ORAL | Status: DC
  Administered 2022-08-31: 10 mg via ORAL
  Filled 2022-08-31: qty 1

## 2022-08-31 MED ORDER — ATORVASTATIN CALCIUM 80 MG PO TABS
80.0000 mg | ORAL_TABLET | Freq: Every day | ORAL | 0 refills | Status: DC
  Filled 2022-08-31: qty 30, 30d supply, fill #0

## 2022-08-31 MED ORDER — AMLODIPINE BESYLATE 10 MG PO TABS
10.0000 mg | ORAL_TABLET | Freq: Every day | ORAL | 0 refills | Status: AC
  Filled 2022-08-31: qty 30, 30d supply, fill #0

## 2022-08-31 MED ORDER — LIDOCAINE 5 % EX PTCH
1.0000 | MEDICATED_PATCH | CUTANEOUS | 0 refills | Status: DC
  Filled 2022-08-31: qty 10, 10d supply, fill #0

## 2022-08-31 MED ORDER — PANTOPRAZOLE SODIUM 40 MG PO TBEC
40.0000 mg | DELAYED_RELEASE_TABLET | Freq: Two times a day (BID) | ORAL | 0 refills | Status: DC
  Filled 2022-08-31: qty 60, 30d supply, fill #0

## 2022-08-31 MED ORDER — DICLOFENAC SODIUM 1 % EX GEL
4.0000 g | Freq: Four times a day (QID) | CUTANEOUS | Status: DC
  Filled 2022-08-31: qty 100

## 2022-08-31 MED ORDER — NAPROXEN 250 MG PO TABS: 375.0000 mg | ORAL_TABLET | Freq: Two times a day (BID) | ORAL | Status: DC

## 2022-08-31 NOTE — Telephone Encounter (Signed)
Pharmacy Patient Advocate Encounter   Received notification that prior authorization for Lidocaine 5% patches is required/requested.    PA submitted to [facility] via CoverMyMeds Key/confirmation #/EOC [id number] Status is pending

## 2022-08-31 NOTE — Discharge Summary (Cosign Needed)
Family Medicine Teaching [facility] Discharge Summary  Patient name: Derek French Medical record number: [id number] Date of birth: [birth year] Age: 57 y.o. Gender: male Date of Admission: 08/30/2022  Date of Discharge: 08/31/2022 Admitting Physician: Carney Living, MD  Primary Care Provider: Elberta Fortis, MD Consultants: Cardiology  Indication for Hospitalization: Acute left sided chest pain   Brief Hospital Course:  Derek French is a 57 y.o.male with a history of hypertension, hyperlipidemia, duodenal ulcers who was admitted to the [facility] family medicine teaching Service at [facility] for pleuritic left-sided chest pain. His hospital course is detailed below:  Chest pain Presented with 1 day of left-sided chest pain, worse with inspiration. Full cardiac workup including EKG, troponin x 3, D-dimer, chest x-ray and CT angio chest negative for acute coronary syndrome. An echo was ordered with normal findings.   Admitted for pain management and possible GI workup given history of duodenal ulcers. Spleen was evaluated on CT angiogram which did not show any infarct. As his pain improved with IV Protonix, lidocaine patch, Voltaren gel and as needed pain medication, the thought is he has costochondritis. At time of discharge, he was stable.   Other chronic conditions were medically managed with home medications and formulary alternatives as necessary (hypertension, hyperlipidemia)  PCP Follow-up Recommendations: - f/u on rib pain  - Increased patients Lipitor to 80 mg as per cards rec - increased norvasc to 10 mg as per cards, f/u on BP  Discharge Diagnoses/Problem List:  Hospital Problem List      Hospital     * (Principal) Pleuritic chest pain     Sharp L sided CP x1 day worse with deep inspiration. Pain starts at L  side and radiates to upper back. Cardiac workup in ED negative for acute  cardiac process. Cards saw and signed off on patient, recommended  admissions for  GI work up. Obtained lipase- negative, abdominal aortic  ultrasound - negative. Patient had more diffuse T wave inversions on  repeat EKG, consulted cardiology who stated patient was stable and changes may be d/t lead placement with no recommendations.       Tobacco abuse    Continue education of cessation.  - nicotine patch ordered     Disposition: Home  Discharge Condition: Stable  Discharge Exam:  Physical Exam Constitutional:      Appearance: He is well-developed.  HENT:     Head: Normocephalic.  Eyes:     Extraocular Movements: Extraocular movements intact.     Pupils: Pupils are equal, round, and reactive to light.  Cardiovascular:     Rate and Rhythm: Normal rate and regular rhythm.     Heart sounds: Normal heart sounds.  Pulmonary:     Effort: Pulmonary effort is normal.     Breath sounds: Normal breath sounds.     Comments: TTP at L rib cage, worse when inspiring Abdominal:     General: Bowel sounds are normal.     Palpations: Abdomen is soft.  Neurological:     General: No focal deficit present.     Mental Status: He is alert and oriented to person, place, and time.      Issues for Follow Up:  1.   Significant Procedures:   Significant Labs and Imaging:  Recent Labs  Lab 08/30/22 0712 08/30/22 1412 08/31/22 0358  WBC 7.9 7.1 7.3  HGB 15.0 15.1 14.8  HCT 44.8 44.9 43.4  PLT 292 282 260   Recent Labs  Lab  08/30/22 0712 08/30/22 1324 08/31/22 0358  NA 140  --  137  K 4.1  --  4.2  CL 109  --  110  CO2 22  --  21*  GLUCOSE 96  --  87  BUN 23*  --  19  CREATININE 1.16  --  0.97  CALCIUM 9.0  --  8.6*  ALKPHOS  --  46  --   AST  --  18  --   ALT  --  17  --   ALBUMIN  --  3.5  --     CT Angiogram: negative Chest xray: Low lung volumes without radiographic evidence of acute cardiopulmonary disease. Abdominal Aortic US: No sonographic evidence of abdominal aortic aneurysm   Results/Tests Pending at Time of Discharge: N/A  Discharge  Medications:  Allergies as of 08/31/2022       Reactions   Bee Venom Anaphylaxis   Codeine Other (See Comments)   hallucinations        Medication List     TAKE these medications    amLODipine 10 MG tablet Commonly known as: NORVASC Take 1 tablet (10 mg total) by mouth daily. Start taking on: September 01, 2022 What changed:  medication strength how much to take when to take this   atorvastatin 80 MG tablet Commonly known as: LIPITOR Take 1 tablet (80 mg total) by mouth daily. Start taking on: September 01, 2022 What changed:  medication strength how much to take   diclofenac Sodium 1 % Gel Commonly known as: VOLTAREN Apply 4 g topically 4 (four) times daily.   lidocaine 5 % Commonly known as: LIDODERM Place 1 patch onto the skin daily. Remove & Discard patch within 12 hours or as directed by MD   naproxen sodium 220 MG tablet Commonly known as: ALEVE Take 440 mg by mouth as needed (pain).   pantoprazole 40 MG tablet Commonly known as: PROTONIX Take 1 tablet (40 mg total) by mouth 2 (two) times daily.        Discharge Instructions: Please refer to Patient Instructions section of EMR for full details.  Patient was counseled important signs and symptoms that should prompt return to medical care, changes in medications, dietary instructions, activity restrictions, and follow up appointments.   Follow-Up Appointments:  Follow-up Information     Elberta Fortis, MD Follow up on 09/15/2022.   Specialty: Family Medicine Why: You have an appt with Dr. Ardyth Harps at 8:30am on 7/24. Please arrive 15 minutes early. Contact information: [address] [place] [address] [phone]                 Cyd Silence, MD 08/31/2022, 6:15 PM PGY-1, [facility] Family Medicine

## 2022-08-31 NOTE — Progress Notes (Signed)
Patient's BP 173/91 and HR 58. EKG shows bradycardia. Cyndia Skeeters, DO with family medicine notified via secured chat.  Derek French

## 2022-08-31 NOTE — TOC Transition Note (Addendum)
Transition of Care [facility]) - CM/SW Discharge Note   Patient Details  Name: Derek French MRN: [id number] Date of Birth: May 28, 1965  [facility]) CM/SW Contact:  Janae Bridgeman, RN Phone Number: 08/31/2022, 1:14 PM   Clinical Narrative:    CM met with the patient at the bedside and he needs assistance with transportation to home.  Patient states [place] and [place] safely travel by bus.  Bus tickets provided through [employer].  Charge RN notified that transportation assistance provided.  The patient states that his mother is present at the home when he returns.  Patient plans to quit smoking - smoking cessation provided in the AVS.         Patient Goals and CMS Choice      Discharge Placement                         Discharge Plan and Services Additional resources added to the After Visit Summary for                                       Social Determinants of Health (SDOH) Interventions SDOH Screenings   Food Insecurity: No Food Insecurity (08/30/2022)  Housing: Low Risk  (08/30/2022)  Transportation Needs: No Transportation Needs (08/30/2022)  Utilities: Not At Risk (08/30/2022)  Depression (PHQ2-9): Low Risk  (01/25/2022)  Tobacco Use: High Risk (08/30/2022)     Readmission Risk Interventions     No data to display

## 2022-08-31 NOTE — Progress Notes (Signed)
Progress Note  Patient Name: Derek French Date of Encounter: 08/31/2022  Primary Cardiologist:   None   Subjective   Continued chest pain with respiration.  No acute SOB.   Inpatient Medications    Scheduled Meds:  amLODipine  5 mg Oral Daily   atorvastatin  40 mg Oral Daily   diclofenac Sodium  4 g Topical BID   enoxaparin (LOVENOX) injection  40 mg Subcutaneous Q24H   lidocaine  1 patch Transdermal Q24H   nicotine  14 mg Transdermal Daily   pantoprazole (PROTONIX) IV  40 mg Intravenous BID   Continuous Infusions:  PRN Meds: acetaminophen **OR** acetaminophen, ondansetron **OR** ondansetron (ZOFRAN) IV, oxyCODONE, polyethylene glycol   Vital Signs    Vitals:   08/31/22 0000 08/31/22 0239 08/31/22 0514 08/31/22 0805  BP:  (!) 150/95 (!) 173/91 (!) 164/98  Pulse:  61 (!) 58 62  Resp:   18 20  Temp:  97.7 F (36.5 C) (!) 97.5 F (36.4 C) 98.1 F (36.7 C)  TempSrc:  Oral Oral Oral  SpO2:  95% 95% 99%  Weight: 80.3 kg     Height:        Intake/Output Summary (Last 24 hours) at 08/31/2022 0818 Last data filed at 08/31/2022 0802 Gross per 24 hour  Intake 355 ml  Output 540 ml  Net -185 ml   Filed Weights   08/30/22 0701 08/31/22 0000  Weight: 74.8 kg 80.3 kg    Telemetry    NA - Personally Reviewed  ECG    NA - Personally Reviewed  Physical Exam   GEN: No acute distress.   Neck: No  JVD Cardiac: RRR, no murmurs, rubs, or gallops.  Respiratory: Clear  to auscultation bilaterally. GI: Soft, nontender, non-distended  MS: No  edema; No deformity. Neuro:  Nonfocal  Psych: Normal affect   Labs    Chemistry Recent Labs  Lab 08/30/22 0712 08/30/22 1324 08/31/22 0358  NA 140  --  137  K 4.1  --  4.2  CL 109  --  110  CO2 22  --  21*  GLUCOSE 96  --  87  BUN 23*  --  19  CREATININE 1.16  --  0.97  CALCIUM 9.0  --  8.6*  PROT  --  6.6  --   ALBUMIN  --  3.5  --   AST  --  18  --   ALT  --  17  --   ALKPHOS  --  46  --   BILITOT  --   0.7  --   GFRNONAA >60  --  >60  ANIONGAP 9  --  6     Hematology Recent Labs  Lab 08/30/22 0712 08/30/22 1412 08/31/22 0358  WBC 7.9 7.1 7.3  RBC 4.75 4.71 4.63  HGB 15.0 15.1 14.8  HCT 44.8 44.9 43.4  MCV 94.3 95.3 93.7  MCH 31.6 32.1 32.0  MCHC 33.5 33.6 34.1  RDW 14.0 13.9 13.9  PLT 292 282 260    Cardiac EnzymesNo results for input(s): "TROPONINI" in the last 168 hours. No results for input(s): "TROPIPOC" in the last 168 hours.   BNPNo results for input(s): "BNP", "PROBNP" in the last 168 hours.   DDimer  Recent Labs  Lab 08/30/22 0756  DDIMER 0.28     Radiology    US AORTA MEDICARE SCREENING  Result Date: 08/30/2022 CLINICAL DATA:  Male with smoking history. EXAM: US ABDOMINAL AORTA MEDICARE SCREENING TECHNIQUE:  Ultrasound examination of the abdominal aorta was performed as a screening evaluation for abdominal aortic aneurysm. COMPARISON:  January 16, 2022. FINDINGS: Abdominal aortic measurements as follows: Proximal:  2.5 cm Mid:  2.0 cm Distal:  2.0 cm IMPRESSION: No sonographic evidence of abdominal aortic aneurysm. Electronically Signed   By: Lupita Raider M.D.   On: 08/30/2022 16:22   CT Angio Chest PE W and/or Wo Contrast  Result Date: 08/30/2022 CLINICAL DATA:  Lambert Mody left-sided chest pain. EXAM: CT ANGIOGRAPHY CHEST WITH CONTRAST TECHNIQUE: Multidetector CT imaging of the chest was performed using the standard protocol during bolus administration of intravenous contrast. Multiplanar CT image reconstructions and MIPs were obtained to evaluate the vascular anatomy. RADIATION DOSE REDUCTION: This exam was performed according to the departmental dose-optimization program which includes automated exposure control, adjustment of the mA and/or kV according to patient size and/or use of iterative reconstruction technique. CONTRAST:  75mL OMNIPAQUE IOHEXOL 350 MG/ML SOLN COMPARISON:  Prior CTA of the chest, abdomen and pelvis on 01/10/2017 FINDINGS: Cardiovascular: The  pulmonary arteries are adequately opacified. There is no evidence of pulmonary embolism. Central pulmonary arteries are of normal caliber. The thoracic aorta is normal in caliber. No significant aortic atherosclerosis. The heart size is normal. No pericardial fluid identified. No visualized calcified coronary artery plaque. Mediastinum/Nodes: No enlarged mediastinal, hilar, or axillary lymph nodes. Thyroid gland, trachea, and esophagus demonstrate no significant findings. Lungs/Pleura: Stable bullous disease in both upper lobes. Atelectasis at both lung bases. There is no evidence of pulmonary edema, consolidation, pneumothorax, nodule or pleural fluid. Upper Abdomen: No acute abnormality. Musculoskeletal: No chest wall abnormality. No acute or significant osseous findings. Review of the MIP images confirms the above findings. IMPRESSION: 1. No evidence of pulmonary embolism. 2. Stable bullous disease in both upper lobes. 3. Atelectasis at both lung bases. Emphysema (ICD10-J43.9). Electronically Signed   By: Irish Lack M.D.   On: 08/30/2022 10:40   DG Chest 2 View  Result Date: 08/30/2022 CLINICAL DATA:  57 year old male with history of chest pain. EXAM: CHEST - 2 VIEW COMPARISON:  Chest x-ray 01/10/2017. FINDINGS: Lung volumes are low. No consolidative airspace disease. No pleural effusions. No pneumothorax. No pulmonary nodule or mass noted. Pulmonary vasculature and the cardiomediastinal silhouette are within normal limits. IMPRESSION: 1. Low lung volumes without radiographic evidence of acute cardiopulmonary disease. Electronically Signed   By: Trudie Reed M.D.   On: 08/30/2022 07:26    Cardiac Studies   Echo EF NL.  No wall motion abnormalities.    Patient Profile     57 y.o. male with lower left chest pain.  No prior cardiac history.    Assessment & Plan    Chest pain:  Non anginal greater than anginal features.   EKG with T wave inversion non specifically changed compared with  previous.  Also with loss of anterior R waves that could represent lead placement.  Trop negative.   No wall motion abnormalities on echo.  No further cardiac work up.  Only mild LVH  AS:   Mild on echo.  This can be followed clinically.   Dyslipidemia:  I will increases his statin to 80 mg po daily.    HTN:  Increase Norvasc to 10 mg po daily.   For questions or updates, please contact [facility] Please consult [email or web address] for contact info under Cardiology/STEMI.   Signed, Rollene Rotunda, MD  08/31/2022, 8:18 AM

## 2022-08-31 NOTE — Progress Notes (Signed)
Discharge instructions given with stated understanding.  Patient waiting for meds from [facility] pharmacy at this time

## 2022-08-31 NOTE — Telephone Encounter (Signed)
Pharmacy Patient Advocate Encounter  Received notification from [facility] that Prior Authorization for Lidocaine 5% patches  has been DENIED because  .Marland Kitchen Your plan only covers this drug when it is used for certain health conditions. Covered uses are: A) pain associated with post-herpetic neuralgia, B) pain associated with diabetic neuropathy, C) pain associated with cancer-related neuropathy (including treatment-related neuropathy). Your plan does not cover this drug for your health condition that your doctor told us you have. We reviewed the information we had. Your request has been denied. PA #/Case ID/Reference #: [id number]

## 2022-08-31 NOTE — Discharge Instructions (Addendum)
Derek French,  You were admitted to the hospital due to pain that you are having on the left side of your chest.  We took a good look at your heart and we have no reason to think that this pain is coming from your heart.  We also get a look in your belly with a CT scan and did not see anything that made [place] concerned for something inside of your belly causing this.  Our best guess is that this is related to a strain in the muscles and tissues around her ribs.  We will therefore treat this with topical medications including Voltaren gel and a lidocaine patch.  You can also continue to take your naproxen as needed for this pain.  We will have you follow-up with [place] in clinic with Dr. Ardyth Harps on 7/24.  Your appointment is at 8:30 in the morning, please arrive 15 minutes early.  Eliezer Mccoy, MD

## 2022-09-13 NOTE — Progress Notes (Deleted)
    SUBJECTIVE:   CHIEF COMPLAINT / HPI:   Hospital follow up Admitted from 7/8 to 7/9 for pleuritic chest pain. Negative cardiac work up and normal Echo. Possibly secondary to costochondritis.   PCP Follow-up Recommendations: 1) We increased his amlodipine from 5mg  to 10mg  daily and increased his Lipitor to 80mg  2) Consider outpatient GI consult if you are concerned for GI source of his symptoms.     PERTINENT  PMH / PSH: ***  OBJECTIVE:   There were no vitals taken for this visit. ***  General: NAD, pleasant, able to participate in exam Cardiac: RRR, no murmurs. Respiratory: CTAB, normal effort, No wheezes, rales or rhonchi Abdomen: Bowel sounds present, nontender, nondistended Extremities: no edema or cyanosis. Skin: warm and dry, no rashes noted Neuro: alert, no obvious focal deficits Psych: Normal affect and mood  ASSESSMENT/PLAN:   No problem-specific Assessment & Plan notes found for this encounter.     Dr. Elberta Fortis, DO [place]    {    This will disappear when note is signed, click to select method of visit    :1}

## 2022-09-15 ENCOUNTER — Inpatient Hospital Stay: Payer: Self-pay | Admitting: Family Medicine

## 2022-10-06 DIAGNOSIS — Z791 Long term (current) use of non-steroidal anti-inflammatories (NSAID): Secondary | ICD-10-CM | POA: Diagnosis not present

## 2022-10-06 DIAGNOSIS — E785 Hyperlipidemia, unspecified: Secondary | ICD-10-CM | POA: Diagnosis not present

## 2022-10-06 DIAGNOSIS — M1990 Unspecified osteoarthritis, unspecified site: Secondary | ICD-10-CM | POA: Diagnosis not present

## 2022-10-06 DIAGNOSIS — Z720 Tobacco use: Secondary | ICD-10-CM | POA: Diagnosis not present

## 2022-10-06 DIAGNOSIS — I10 Essential (primary) hypertension: Secondary | ICD-10-CM | POA: Diagnosis not present

## 2022-10-06 DIAGNOSIS — Z8249 Family history of ischemic heart disease and other diseases of the circulatory system: Secondary | ICD-10-CM | POA: Diagnosis not present

## 2022-10-06 DIAGNOSIS — K219 Gastro-esophageal reflux disease without esophagitis: Secondary | ICD-10-CM | POA: Diagnosis not present

## 2022-10-06 DIAGNOSIS — Z87892 Personal history of anaphylaxis: Secondary | ICD-10-CM | POA: Diagnosis not present

## 2022-10-06 DIAGNOSIS — Z8673 Personal history of transient ischemic attack (TIA), and cerebral infarction without residual deficits: Secondary | ICD-10-CM | POA: Diagnosis not present

## 2022-10-06 DIAGNOSIS — Z91038 Other insect allergy status: Secondary | ICD-10-CM | POA: Diagnosis not present

## 2022-11-08 ENCOUNTER — Telehealth: Payer: Self-pay | Admitting: Pharmacist

## 2022-11-08 NOTE — Telephone Encounter (Signed)
Attempted to contact patient for follow-up of blood pressure/hypertension control.   Left HIPAA compliant voice mail requesting call back to direct phone: [phone] for scheduling blood pressure visit with clinic pharmacist.   Total time with patient call and documentation of interaction: 3 minutes.

## 2023-05-20 ENCOUNTER — Encounter (HOSPITAL_COMMUNITY): Payer: Self-pay

## 2023-05-20 ENCOUNTER — Other Ambulatory Visit: Payer: Self-pay

## 2023-05-20 ENCOUNTER — Emergency Department (HOSPITAL_COMMUNITY): Payer: Self-pay

## 2023-05-20 ENCOUNTER — Emergency Department (HOSPITAL_COMMUNITY)
Admission: EM | Admit: 2023-05-20 | Discharge: 2023-05-20 | Disposition: A | Discharge status: Home / Self Care | Payer: Self-pay | Source: Home / Self Care | Attending: Emergency Medicine | Admitting: Emergency Medicine

## 2023-05-20 DIAGNOSIS — R091 Pleurisy: Secondary | ICD-10-CM | POA: Insufficient documentation

## 2023-05-20 DIAGNOSIS — Z79899 Other long term (current) drug therapy: Secondary | ICD-10-CM | POA: Insufficient documentation

## 2023-05-20 DIAGNOSIS — I10 Essential (primary) hypertension: Secondary | ICD-10-CM | POA: Insufficient documentation

## 2023-05-20 LAB — BASIC METABOLIC PANEL WITH GFR
Anion gap: 9 (ref 5–15)
BUN: 14 mg/dL (ref 6–20)
CO2: 20 mmol/L — ABNORMAL LOW (ref 22–32)
Calcium: 9 mg/dL (ref 8.9–10.3)
Chloride: 110 mmol/L (ref 98–111)
Creatinine, Ser: 1.07 mg/dL (ref 0.61–1.24)
GFR, Estimated: >60 mL/min (ref >60)
Glucose, Bld: 100 mg/dL — ABNORMAL HIGH (ref 70–99)
Potassium: 3.9 mmol/L (ref 3.5–5.1)
Sodium: 139 mmol/L (ref 135–145)

## 2023-05-20 LAB — CBC
HCT: 45.6 % (ref 39.0–52.0)
Hemoglobin: 15.5 g/dL (ref 13.0–17.0)
MCH: 32.1 pg (ref 26.0–34.0)
MCHC: 34 g/dL (ref 30.0–36.0)
MCV: 94.4 fL (ref 80.0–100.0)
Platelets: 249 10*3/uL (ref 150–400)
RBC: 4.83 MIL/uL (ref 4.22–5.81)
RDW: 14.3 % (ref 11.5–15.5)
WBC: 8 10*3/uL (ref 4.0–10.5)
nRBC: 0 % (ref 0.0–0.2)

## 2023-05-20 LAB — RESP PANEL BY RT-PCR (RSV, FLU A&B, COVID)  RVPGX2
Influenza A by PCR: NEGATIVE
Influenza B by PCR: NEGATIVE
Resp Syncytial Virus by PCR: NEGATIVE
SARS Coronavirus 2 by RT PCR: NEGATIVE

## 2023-05-20 LAB — TROPONIN I (HIGH SENSITIVITY): Troponin I (High Sensitivity): 17 ng/L (ref <18)

## 2023-05-20 LAB — D-DIMER, QUANTITATIVE: D-Dimer, Quant: 0.46 ug{FEU}/mL (ref 0.00–0.50)

## 2023-05-20 MED ORDER — INDOMETHACIN 50 MG PO CAPS: 50.0000 mg | ORAL_CAPSULE | Freq: Two times a day (BID) | ORAL | 0 refills | Status: DC

## 2023-05-20 MED ORDER — LIDOCAINE 5 % EX PTCH: 1.0000 | MEDICATED_PATCH | CUTANEOUS | 0 refills | Status: DC

## 2023-05-20 MED ORDER — ACETAMINOPHEN 500 MG PO TABS
1000.0000 mg | ORAL_TABLET | Freq: Once | ORAL | Status: AC
  Administered 2023-05-20: 1000 mg via ORAL
  Filled 2023-05-20: qty 2

## 2023-05-20 MED ORDER — KETOROLAC TROMETHAMINE 15 MG/ML IJ SOLN
15.0000 mg | Freq: Once | INTRAMUSCULAR | Status: AC
  Administered 2023-05-20: 15 mg via INTRAVENOUS
  Filled 2023-05-20: qty 1

## 2023-05-20 MED ORDER — LIDOCAINE 5 % EX PTCH
1.0000 | MEDICATED_PATCH | Freq: Once | CUTANEOUS | Status: DC
  Administered 2023-05-20: 1 via TRANSDERMAL
  Filled 2023-05-20: qty 1

## 2023-05-20 MED ORDER — ONDANSETRON HCL 4 MG/2ML IJ SOLN
4.0000 mg | Freq: Once | INTRAMUSCULAR | Status: AC
  Administered 2023-05-20: 4 mg via INTRAVENOUS
  Filled 2023-05-20: qty 2

## 2023-05-20 MED ORDER — FENTANYL CITRATE PF 50 MCG/ML IJ SOSY
50.0000 ug | PREFILLED_SYRINGE | Freq: Once | INTRAMUSCULAR | Status: AC
  Administered 2023-05-20: 50 ug via INTRAVENOUS
  Filled 2023-05-20: qty 1

## 2023-05-20 NOTE — ED Provider Notes (Signed)
 [place] EMERGENCY DEPARTMENT AT [facility] Provider Note   CSN: [id number] Arrival date & time: 05/20/23  4098     History  Chief Complaint  Patient presents with   Chest Pain    Derek French is a 58 y.o. male.  Patient is a 58 year old male with a past medical history of hypertension presenting to the emergency department with chest pain.  Patient states that he works night shift and early Thursday morning he started to develop some left-sided chest pain.  He states he feels like a sharp stabbing pain that is worse with taking deep breaths.  He states that over the last 24 hours the pain has gotten worse and he started to have some pressure.  He states he does feel short of breath.  Denies any fever, cough or lower extremity swelling.  Denies any history of VTE, any recent hospitalization or surgery, any recent long travel in the car or plane, hormone use or cancer history.  States he tried taking antacids at home without any relief.  He states that he did have an episode of nausea and vomiting last night.  The history is provided by the patient.  Chest Pain      Home Medications Prior to Admission medications   Medication Sig Start Date End Date Taking? Authorizing Provider  indomethacin (INDOCIN) 50 MG capsule Take 1 capsule (50 mg total) by mouth 2 (two) times daily with a meal. 05/20/23  Yes Theresia Lo, [place] K, DO  lidocaine (LIDODERM) 5 % Place 1 patch onto the skin daily. Remove & Discard patch within 12 hours or as directed by MD 05/20/23  Yes Theresia Lo, Benetta Spar K, DO  amLODipine (NORVASC) 10 MG tablet Take 1 tablet (10 mg total) by mouth daily. 09/01/22   Alicia Amel, MD  atorvastatin (LIPITOR) 80 MG tablet Take 1 tablet (80 mg total) by mouth daily. 09/01/22   Alicia Amel, MD  diclofenac Sodium (VOLTAREN) 1 % GEL Apply 4 g topically 4 (four) times daily. 08/31/22   Alicia Amel, MD  naproxen sodium (ALEVE) 220 MG tablet Take 440 mg by mouth as  needed (pain).    [provider]  pantoprazole (PROTONIX) 40 MG tablet Take 1 tablet (40 mg total) by mouth 2 (two) times daily. 08/31/22 10/30/22  Alicia Amel, MD      Allergies    Bee venom and Codeine    Review of Systems   Review of Systems  Cardiovascular:  Positive for chest pain.    Physical Exam Updated Vital Signs BP (!) 146/84   Pulse (!) 56   Temp 97.9 F (36.6 C) (Oral)   Resp 20   Ht 6\' 2"  (1.88 m)   Wt 77.1 kg   SpO2 94%   BMI 21.83 kg/m  Physical Exam Vitals and nursing note reviewed.  Constitutional:      General: He is not in acute distress.    Appearance: He is well-developed.     Comments: Uncomfortable appearing holding left side of chest  HENT:     Head: Normocephalic and atraumatic.  Eyes:     Extraocular Movements: Extraocular movements intact.  Cardiovascular:     Rate and Rhythm: Normal rate and regular rhythm.     Pulses:          Radial pulses are 2+ on the right side and 2+ on the left side.     Heart sounds: Normal heart sounds.  Pulmonary:  Effort: Pulmonary effort is normal.     Breath sounds: Normal breath sounds.  Chest:     Chest wall: No tenderness.  Abdominal:     Palpations: Abdomen is soft.     Tenderness: There is no abdominal tenderness.  Musculoskeletal:        General: Normal range of motion.     Cervical back: Normal range of motion and neck supple.     Right lower leg: No edema.     Left lower leg: No edema.  Skin:    General: Skin is warm and dry.  Neurological:     General: No focal deficit present.     Mental Status: He is alert and oriented to person, place, and time.  Psychiatric:        Mood and Affect: Mood normal.        Behavior: Behavior normal.     ED Results / Procedures / Treatments   Labs (all labs ordered are listed, but only abnormal results are displayed) Labs Reviewed  BASIC METABOLIC PANEL WITH GFR - Abnormal; Notable for the following components:      Result Value   CO2  20 (*)    Glucose, Bld 100 (*)    All other components within normal limits  RESP PANEL BY RT-PCR (RSV, FLU A&B, COVID)  [id number]  CBC  D-DIMER, QUANTITATIVE  TROPONIN I (HIGH SENSITIVITY)    EKG EKG Interpretation Date/Time:  Friday May 20 2023 08:54:11 EDT Ventricular Rate:  68 PR Interval:  158 QRS Duration:  92 QT Interval:  379 QTC Calculation: 403 R Axis:   76  Text Interpretation: Sinus rhythm Consider left ventricular hypertrophy Nonspecific T abnormalities, inferior leads ST elevation, consider anterior injury Previous anterior T-wave inversions now upright compared to prior EKG Confirmed by Elayne Snare ([id number]) on 05/20/2023 9:00:40 AM  Radiology DG Chest Portable 1 View Result Date: 05/20/2023 CLINICAL DATA:  chest pain EXAM: PORTABLE CHEST - 1 VIEW COMPARISON:  08/30/2022 FINDINGS: Lungs are clear.  No pneumothorax. Heart size and mediastinal contours are within normal limits. No effusion. Visualized bones unremarkable. IMPRESSION: No acute cardiopulmonary disease. Electronically Signed   By: Corlis Leak M.D.   On: 05/20/2023 09:18    Procedures Ultrasound ED Echo  Date/Time: 05/20/2023 11:21 AM  Performed by: Rexford Maus, DO Authorized by: Rexford Maus, DO   Procedure details:    Indications: chest pain     Views: subxiphoid, apical 4 chamber view and IVC view     Images: archived     Limitations:  Positioning and increased thoracic air Findings:    Pericardium: no pericardial effusion     LV Function: normal (>50% EF)     RV Diameter: normal     IVC: normal   Impression:    Impression: normal       Medications Ordered in ED Medications  lidocaine (LIDODERM) 5 % 1-3 patch (1 patch Transdermal Patch Applied 05/20/23 1001)  fentaNYL (SUBLIMAZE) injection 50 mcg (50 mcg Intravenous Given 05/20/23 1000)  acetaminophen (TYLENOL) tablet 1,000 mg (1,000 mg Oral Given 05/20/23 1000)  ondansetron (ZOFRAN) injection 4 mg (4 mg Intravenous Given  05/20/23 1000)  ketorolac (TORADOL) 15 MG/ML injection 15 mg (15 mg Intravenous Given 05/20/23 1119)    ED Course/ Medical Decision Making/ A&P Clinical Course as of 05/20/23 1126  Fri May 20, 2023  0948 Initial troponin negative, d-dimer is pending. CXR without acute disease. [VK]  1038 D-dimer negative making PE unlikely. Symptoms  ongoing for >24 hr so single troponin is sufficient. [VK]  1120 Patient still reporting pain, bedside ultrasound performed that showed no pericardial effusion and no signs of pericarditis on EKG. Suspect pleurisy. Will be given toradol and prescription for indomethacin. Recommended outpatient follow up and given strict return precautions. [VK]    Clinical Course User Index [VK] Rexford Maus, DO                                 Medical Decision Making This patient presents to the ED with chief complaint(s) of chest pain with pertinent past medical history of hypertension which further complicates the presenting complaint. The complaint involves an extensive differential diagnosis and also carries with it a high risk of complications and morbidity.    The differential diagnosis includes ACS, arrhythmia, anemia, pneumonia, pneumothorax, pulmonary edema, pleural effusion, gastritis, GERD, considering PE though he is low risk by Wells criteria, costochondritis/musculoskeletal pain  Additional history obtained: Additional history obtained from N/A Records reviewed previous admission documents  ED Course and Reassessment: On patient's arrival he is mildly hypertensive otherwise hemodynamically stable in no acute distress.  EKG on arrival showed normal sinus rhythm, upright T waves anteriorly that were previously inverted.  Patient had labs initiated in triage including troponin and will additionally have D-dimer added on as well as chest x-ray.  Patient will be given pain control and will be closely reassessed.  Independent labs interpretation:  The following  labs were independently interpreted: within normal range  Independent visualization of imaging: - I independently visualized the following imaging with scope of interpretation limited to determining acute life threatening conditions related to emergency care: CXR, which revealed no acute disease  Consultation: - Consulted or discussed management/test interpretation w/ external professional: N/A  Consideration for admission or further workup: Patient has no emergent conditions requiring admission or further work-up at this time and is stable for discharge home with primary care follow-up  Social Determinants of health: N/A    Amount and/or Complexity of Data Reviewed Labs: ordered. Radiology: ordered.  Risk OTC drugs. Prescription drug management.          Final Clinical Impression(s) / ED Diagnoses Final diagnoses:  Pleurisy    Rx / DC Orders ED Discharge Orders          Ordered    indomethacin (INDOCIN) 50 MG capsule  2 times daily with meals        05/20/23 1125    lidocaine (LIDODERM) 5 %  Every 24 hours        05/20/23 1125              Rexford Maus, DO 05/20/23 1126

## 2023-05-20 NOTE — Discharge Instructions (Signed)
 You were seen in the emergency department for your chest pain.  Your workup showed no signs of heart attack or stress on your heart, no signs of blood clot or inflammation or fluid around your heart.  You likely have pleurisy which is inflammation of the lining of your lungs.  I have given you an anti-inflammatory that you should take daily as prescribed to help improve your pain and you can also use lidocaine patches or Tylenol.  You can follow-up with your primary doctor in the next few days to have your symptoms rechecked.  You should return to the emergency department for significantly worsening pain, severe shortness of breath, if you pass out or if you have any other new or concerning symptoms.

## 2023-05-20 NOTE — ED Triage Notes (Signed)
 Pt c/o mid-sternal chest pressure that started yesterday. Pt has had shortness of breath, nausea and vomiting.

## 2023-05-23 ENCOUNTER — Other Ambulatory Visit: Payer: Self-pay

## 2023-05-23 ENCOUNTER — Emergency Department (HOSPITAL_COMMUNITY)

## 2023-05-23 ENCOUNTER — Encounter (HOSPITAL_COMMUNITY): Payer: Self-pay

## 2023-05-23 ENCOUNTER — Inpatient Hospital Stay (HOSPITAL_COMMUNITY)
Admission: EM | Admit: 2023-05-23 | Discharge: 2023-05-26 | DRG: 092 | Disposition: A | Discharge status: Home / Self Care | Attending: Neurology | Admitting: Neurology

## 2023-05-23 DIAGNOSIS — Z8719 Personal history of other diseases of the digestive system: Secondary | ICD-10-CM

## 2023-05-23 DIAGNOSIS — I723 Aneurysm of iliac artery: Secondary | ICD-10-CM | POA: Diagnosis present

## 2023-05-23 DIAGNOSIS — Z8249 Family history of ischemic heart disease and other diseases of the circulatory system: Secondary | ICD-10-CM | POA: Diagnosis not present

## 2023-05-23 DIAGNOSIS — I10 Essential (primary) hypertension: Secondary | ICD-10-CM | POA: Diagnosis present

## 2023-05-23 DIAGNOSIS — C9000 Multiple myeloma not having achieved remission: Secondary | ICD-10-CM | POA: Diagnosis present

## 2023-05-23 DIAGNOSIS — Z91030 Bee allergy status: Secondary | ICD-10-CM

## 2023-05-23 DIAGNOSIS — R471 Dysarthria and anarthria: Secondary | ICD-10-CM | POA: Diagnosis present

## 2023-05-23 DIAGNOSIS — F1410 Cocaine abuse, uncomplicated: Secondary | ICD-10-CM | POA: Diagnosis present

## 2023-05-23 DIAGNOSIS — J449 Chronic obstructive pulmonary disease, unspecified: Secondary | ICD-10-CM | POA: Diagnosis present

## 2023-05-23 DIAGNOSIS — R0609 Other forms of dyspnea: Secondary | ICD-10-CM | POA: Diagnosis not present

## 2023-05-23 DIAGNOSIS — R4701 Aphasia: Secondary | ICD-10-CM | POA: Diagnosis present

## 2023-05-23 DIAGNOSIS — I639 Cerebral infarction, unspecified: Secondary | ICD-10-CM | POA: Diagnosis present

## 2023-05-23 DIAGNOSIS — F0390 Unspecified dementia without behavioral disturbance: Secondary | ICD-10-CM | POA: Diagnosis present

## 2023-05-23 DIAGNOSIS — R29818 Other symptoms and signs involving the nervous system: Principal | ICD-10-CM | POA: Diagnosis present

## 2023-05-23 DIAGNOSIS — K759 Inflammatory liver disease, unspecified: Secondary | ICD-10-CM | POA: Diagnosis present

## 2023-05-23 DIAGNOSIS — F40240 Claustrophobia: Secondary | ICD-10-CM | POA: Diagnosis present

## 2023-05-23 DIAGNOSIS — F17210 Nicotine dependence, cigarettes, uncomplicated: Secondary | ICD-10-CM | POA: Diagnosis present

## 2023-05-23 DIAGNOSIS — I2510 Atherosclerotic heart disease of native coronary artery without angina pectoris: Secondary | ICD-10-CM | POA: Diagnosis present

## 2023-05-23 DIAGNOSIS — R0781 Pleurodynia: Secondary | ICD-10-CM | POA: Diagnosis present

## 2023-05-23 DIAGNOSIS — I252 Old myocardial infarction: Secondary | ICD-10-CM

## 2023-05-23 DIAGNOSIS — R531 Weakness: Secondary | ICD-10-CM | POA: Diagnosis not present

## 2023-05-23 DIAGNOSIS — R091 Pleurisy: Secondary | ICD-10-CM | POA: Diagnosis present

## 2023-05-23 DIAGNOSIS — R079 Chest pain, unspecified: Secondary | ICD-10-CM | POA: Diagnosis not present

## 2023-05-23 DIAGNOSIS — Z885 Allergy status to narcotic agent status: Secondary | ICD-10-CM

## 2023-05-23 DIAGNOSIS — H5347 Heteronymous bilateral field defects: Secondary | ICD-10-CM | POA: Diagnosis present

## 2023-05-23 DIAGNOSIS — H538 Other visual disturbances: Secondary | ICD-10-CM | POA: Diagnosis present

## 2023-05-23 DIAGNOSIS — Z9889 Other specified postprocedural states: Secondary | ICD-10-CM

## 2023-05-23 DIAGNOSIS — I69351 Hemiplegia and hemiparesis following cerebral infarction affecting right dominant side: Secondary | ICD-10-CM

## 2023-05-23 DIAGNOSIS — R519 Headache, unspecified: Secondary | ICD-10-CM | POA: Diagnosis not present

## 2023-05-23 DIAGNOSIS — Z859 Personal history of malignant neoplasm, unspecified: Secondary | ICD-10-CM

## 2023-05-23 DIAGNOSIS — E785 Hyperlipidemia, unspecified: Secondary | ICD-10-CM | POA: Diagnosis present

## 2023-05-23 DIAGNOSIS — M797 Fibromyalgia: Secondary | ICD-10-CM | POA: Diagnosis present

## 2023-05-23 DIAGNOSIS — Z1152 Encounter for screening for COVID-19: Secondary | ICD-10-CM

## 2023-05-23 DIAGNOSIS — Z79899 Other long term (current) drug therapy: Secondary | ICD-10-CM | POA: Diagnosis not present

## 2023-05-23 DIAGNOSIS — E119 Type 2 diabetes mellitus without complications: Secondary | ICD-10-CM | POA: Diagnosis present

## 2023-05-23 DIAGNOSIS — Z7982 Long term (current) use of aspirin: Secondary | ICD-10-CM

## 2023-05-23 DIAGNOSIS — F1921 Other psychoactive substance dependence, in remission: Secondary | ICD-10-CM | POA: Diagnosis not present

## 2023-05-23 DIAGNOSIS — Z862 Personal history of diseases of the blood and blood-forming organs and certain disorders involving the immune mechanism: Secondary | ICD-10-CM

## 2023-05-23 DIAGNOSIS — R2990 Unspecified symptoms and signs involving the nervous system: Principal | ICD-10-CM

## 2023-05-23 DIAGNOSIS — Z87892 Personal history of anaphylaxis: Secondary | ICD-10-CM

## 2023-05-23 DIAGNOSIS — I69398 Other sequelae of cerebral infarction: Secondary | ICD-10-CM

## 2023-05-23 DIAGNOSIS — Z823 Family history of stroke: Secondary | ICD-10-CM

## 2023-05-23 DIAGNOSIS — R200 Anesthesia of skin: Secondary | ICD-10-CM | POA: Diagnosis not present

## 2023-05-23 LAB — CBC
HCT: 44 % (ref 39.0–52.0)
HCT: 40.7 % (ref 39.0–52.0)
Hemoglobin: 15.2 g/dL (ref 13.0–17.0)
Hemoglobin: 14 g/dL (ref 13.0–17.0)
MCH: 31.8 pg (ref 26.0–34.0)
MCH: 31.7 pg (ref 26.0–34.0)
MCHC: 34.5 g/dL (ref 30.0–36.0)
MCHC: 34.4 g/dL (ref 30.0–36.0)
MCV: 92.1 fL (ref 80.0–100.0)
MCV: 92.3 fL (ref 80.0–100.0)
Platelets: 248 10*3/uL (ref 150–400)
Platelets: 231 10*3/uL (ref 150–400)
RBC: 4.78 MIL/uL (ref 4.22–5.81)
RBC: 4.41 MIL/uL (ref 4.22–5.81)
RDW: 13.6 % (ref 11.5–15.5)
RDW: 13.5 % (ref 11.5–15.5)
WBC: 5 10*3/uL (ref 4.0–10.5)
WBC: 5.4 10*3/uL (ref 4.0–10.5)
nRBC: 0 % (ref 0.0–0.2)
nRBC: 0 % (ref 0.0–0.2)

## 2023-05-23 LAB — COMPREHENSIVE METABOLIC PANEL WITH GFR
ALT: 10 U/L (ref 0–44)
AST: 16 U/L (ref 15–41)
Albumin: 3.1 g/dL — ABNORMAL LOW (ref 3.5–5.0)
Alkaline Phosphatase: 41 U/L (ref 38–126)
Anion gap: 10 (ref 5–15)
BUN: 9 mg/dL (ref 6–20)
CO2: 19 mmol/L — ABNORMAL LOW (ref 22–32)
Calcium: 8.7 mg/dL — ABNORMAL LOW (ref 8.9–10.3)
Chloride: 106 mmol/L (ref 98–111)
Creatinine, Ser: 1.01 mg/dL (ref 0.61–1.24)
GFR, Estimated: >60 mL/min (ref >60)
Glucose, Bld: 78 mg/dL (ref 70–99)
Potassium: 3.6 mmol/L (ref 3.5–5.1)
Sodium: 135 mmol/L (ref 135–145)
Total Bilirubin: 1.2 mg/dL (ref 0.0–1.2)
Total Protein: 6 g/dL — ABNORMAL LOW (ref 6.5–8.1)

## 2023-05-23 LAB — HEMOGLOBIN A1C
Hgb A1c MFr Bld: 5.3 % (ref 4.8–5.6)
Mean Plasma Glucose: 105.41 mg/dL

## 2023-05-23 LAB — I-STAT CHEM 8, ED
BUN: 9 mg/dL (ref 6–20)
Calcium, Ion: 1.06 mmol/L — ABNORMAL LOW (ref 1.15–1.40)
Chloride: 106 mmol/L (ref 98–111)
Creatinine, Ser: 1 mg/dL (ref 0.61–1.24)
Glucose, Bld: 79 mg/dL (ref 70–99)
HCT: 41 % (ref 39.0–52.0)
Hemoglobin: 13.9 g/dL (ref 13.0–17.0)
Potassium: 3.8 mmol/L (ref 3.5–5.1)
Sodium: 138 mmol/L (ref 135–145)
TCO2: 21 mmol/L — ABNORMAL LOW (ref 22–32)

## 2023-05-23 LAB — DIFFERENTIAL
Abs Immature Granulocytes: 0.01 10*3/uL (ref 0.00–0.07)
Basophils Absolute: 0 10*3/uL (ref 0.0–0.1)
Basophils Relative: 1 %
Eosinophils Absolute: 0.1 10*3/uL (ref 0.0–0.5)
Eosinophils Relative: 1 %
Immature Granulocytes: 0 %
Lymphocytes Relative: 44 %
Lymphs Abs: 2.4 10*3/uL (ref 0.7–4.0)
Monocytes Absolute: 0.6 10*3/uL (ref 0.1–1.0)
Monocytes Relative: 11 %
Neutro Abs: 2.3 10*3/uL (ref 1.7–7.7)
Neutrophils Relative %: 43 %

## 2023-05-23 LAB — BASIC METABOLIC PANEL WITH GFR
Anion gap: 9 (ref 5–15)
BUN: 8 mg/dL (ref 6–20)
CO2: 21 mmol/L — ABNORMAL LOW (ref 22–32)
Calcium: 9.4 mg/dL (ref 8.9–10.3)
Chloride: 108 mmol/L (ref 98–111)
Creatinine, Ser: 1.01 mg/dL (ref 0.61–1.24)
GFR, Estimated: >60 mL/min (ref >60)
Glucose, Bld: 98 mg/dL (ref 70–99)
Potassium: 3.8 mmol/L (ref 3.5–5.1)
Sodium: 138 mmol/L (ref 135–145)

## 2023-05-23 LAB — APTT: aPTT: 30 s (ref 24–36)

## 2023-05-23 LAB — TROPONIN I (HIGH SENSITIVITY)
Troponin I (High Sensitivity): 5 ng/L (ref <18)
Troponin I (High Sensitivity): 6 ng/L (ref <18)

## 2023-05-23 LAB — MRSA NEXT GEN BY PCR, NASAL: MRSA by PCR Next Gen: NOT DETECTED

## 2023-05-23 LAB — PROTIME-INR
INR: 1 (ref 0.8–1.2)
Prothrombin Time: 13.6 s (ref 11.4–15.2)

## 2023-05-23 LAB — ETHANOL: Alcohol, Ethyl (B): <10 mg/dL (ref <10)

## 2023-05-23 LAB — HIV ANTIBODY (ROUTINE TESTING W REFLEX): HIV Screen 4th Generation wRfx: NONREACTIVE

## 2023-05-23 MED ORDER — IOHEXOL 350 MG/ML SOLN
100.0000 mL | Freq: Once | INTRAVENOUS | Status: AC | PRN
  Administered 2023-05-23: 100 mL via INTRAVENOUS

## 2023-05-23 MED ORDER — SODIUM CHLORIDE 0.9 % IV SOLN: INTRAVENOUS | Status: DC

## 2023-05-23 MED ORDER — CLEVIDIPINE BUTYRATE 0.5 MG/ML IV EMUL
0.0000 mg/h | INTRAVENOUS | Status: DC
  Administered 2023-05-23: 2 mg/h via INTRAVENOUS
  Administered 2023-05-23: 4 mg/h via INTRAVENOUS
  Filled 2023-05-23: qty 100
  Filled 2023-05-23: qty 50

## 2023-05-23 MED ORDER — ACETAMINOPHEN 650 MG RE SUPP: 650.0000 mg | RECTAL | Status: DC | PRN

## 2023-05-23 MED ORDER — CHLORHEXIDINE GLUCONATE CLOTH 2 % EX PADS
6.0000 | MEDICATED_PAD | Freq: Every day | CUTANEOUS | Status: DC
  Administered 2023-05-23 – 2023-05-26 (×4): 6 via TOPICAL

## 2023-05-23 MED ORDER — ACETAMINOPHEN 160 MG/5ML PO SOLN: 650.0000 mg | ORAL | Status: DC | PRN

## 2023-05-23 MED ORDER — PANTOPRAZOLE SODIUM 40 MG IV SOLR
40.0000 mg | Freq: Every day | INTRAVENOUS | Status: DC
Start: 2023-05-23 — End: 2023-05-24
  Administered 2023-05-23: 40 mg via INTRAVENOUS
  Filled 2023-05-23: qty 10

## 2023-05-23 MED ORDER — SENNOSIDES-DOCUSATE SODIUM 8.6-50 MG PO TABS: 1.0000 | ORAL_TABLET | Freq: Every evening | ORAL | Status: DC | PRN

## 2023-05-23 MED ORDER — TENECTEPLASE FOR STROKE
0.2500 mg/kg | PACK | Freq: Once | INTRAVENOUS | Status: AC
  Administered 2023-05-23: 19 mg via INTRAVENOUS
  Filled 2023-05-23: qty 10

## 2023-05-23 MED ORDER — ACETAMINOPHEN 325 MG PO TABS
650.0000 mg | ORAL_TABLET | ORAL | Status: DC | PRN
  Administered 2023-05-23 – 2023-05-25 (×4): 650 mg via ORAL
  Filled 2023-05-23 (×4): qty 2

## 2023-05-23 MED ORDER — STROKE: EARLY STAGES OF RECOVERY BOOK
Freq: Once | Status: AC
  Filled 2023-05-23: qty 1

## 2023-05-23 NOTE — ED Notes (Signed)
 This RN assuming care at 1831.

## 2023-05-23 NOTE — ED Provider Triage Note (Signed)
 Emergency Medicine Provider Triage Evaluation Note  Derek French , a 58 y.o. male  was evaluated in triage.  Pt complains of CP and SHOB, recently Dx pleurisy. Seen for same on 3/29  Review of Systems  Positive: CP, SHOB, worse with deep inhalation Negative: Fever, chills, cough, congestion, N/V/D  Physical Exam  BP 132/85 (BP Location: Right Arm)   Pulse 65   Temp 98.2 F (36.8 C) (Oral)   Resp (!) 21   Ht 6\' 2"  (1.88 m)   Wt 77.1 kg   SpO2 100%   BMI 21.82 kg/m  Gen:   Awake, no distress, uncomfortable appearing Resp:  Normal effort, shallow inhale MSK:   Moves extremities without difficulty  Other:    Medical Decision Making  Medically screening exam initiated at 4:48 PM.  Appropriate orders placed.  Derek French was informed that the remainder of the evaluation will be completed by another provider, this initial triage assessment does not replace that evaluation, and the importance of remaining in the ED until their evaluation is complete.  Labs ordered   Dolphus Jenny, PA-C 05/23/23 1650

## 2023-05-23 NOTE — ED Notes (Signed)
 Bleeding noted in mouth and from cut on L side of head. Pt states that he was shaving his head PTA. Neuro NP made aware.

## 2023-05-23 NOTE — Progress Notes (Signed)
 PHARMACIST CODE STROKE RESPONSE  Notified to mix TNK at 1801 by Dr. Armanda Magic TNK preparation completed at 1803  TNK dose = 19 mg IV over 5 seconds  Issues/delays encountered (if applicable): none  Cathie Hoops 05/23/23 6:21 PM

## 2023-05-23 NOTE — ED Provider Notes (Signed)
 [place] EMERGENCY DEPARTMENT AT [facility] Provider Note   CSN: [id number] Arrival date & time: 05/23/23  1607  An emergency department physician performed an initial assessment on this suspected stroke patient at 1741.  History  Chief Complaint  Patient presents with   Chest Pain   Shortness of Breath    Derek French is a 58 y.o. male.  58 year old male with a history of cocaine abuse with vasospasm causing a stroke who presents to the emergency department with chest pain, shortness of breath, and weakness.  Patient reports that he has been having sharp chest pain.  Worsened with deep breathing.  Also worsened with movement.  Was initially seen in triage and did not have any neurologic complaints but at approximately 1715 he started experiencing right-sided weakness and numbness and paresthesias.  Denies any cocaine use recently.  Not on any blood thinners.  No neurologic deficits at baseline.  Given aspirin and nitroglycerin by EMS prior to arrival.       Home Medications Prior to Admission medications   Medication Sig Start Date End Date Taking? Authorizing Provider  aspirin EC 81 MG tablet Take 162 mg by mouth in the morning. Swallow whole.   Yes [provider]  calcium carbonate (TUMS - DOSED IN MG ELEMENTAL CALCIUM) 500 MG chewable tablet Chew 2 tablets by mouth as needed for indigestion or heartburn.   Yes [provider]  amLODipine (NORVASC) 10 MG tablet Take 1 tablet (10 mg total) by mouth daily. Patient not taking: Reported on 05/23/2023 09/01/22   Alicia Amel, MD  atorvastatin (LIPITOR) 80 MG tablet Take 1 tablet (80 mg total) by mouth daily. Patient not taking: Reported on 05/23/2023 09/01/22   Alicia Amel, MD  diclofenac Sodium (VOLTAREN) 1 % GEL Apply 4 g topically 4 (four) times daily. Patient not taking: Reported on 05/23/2023 08/31/22   Alicia Amel, MD  indomethacin (INDOCIN) 50 MG capsule Take 1 capsule (50 mg total) by  mouth 2 (two) times daily with a meal. Patient not taking: Reported on 05/23/2023 05/20/23   Elayne Snare K, DO  lidocaine (LIDODERM) 5 % Place 1 patch onto the skin daily. Remove & Discard patch within 12 hours or as directed by MD Patient not taking: Reported on 05/23/2023 05/20/23   Elayne Snare K, DO  naproxen sodium (ALEVE) 220 MG tablet Take 440 mg by mouth as needed (pain). Patient not taking: Reported on 05/23/2023    [provider]  pantoprazole (PROTONIX) 40 MG tablet Take 1 tablet (40 mg total) by mouth 2 (two) times daily. Patient not taking: Reported on 05/23/2023 08/31/22 05/23/23  Alicia Amel, MD      Allergies    Bee venom and Codeine    Review of Systems   Review of Systems  Physical Exam Updated Vital Signs BP (!) 168/82 (BP Location: Right Arm)   Pulse (!) 47   Temp (!) 97.4 F (36.3 C) (Oral)   Resp 16   Ht 6\' 2"  (1.88 m)   Wt 72.7 kg   SpO2 100%   BMI 20.58 kg/m  Physical Exam Vitals and nursing note reviewed.  Constitutional:      General: He is in acute distress.     Appearance: He is well-developed. He is ill-appearing.     Comments: Anxious appearing  HENT:     Head: Normocephalic and atraumatic.     Right Ear: External ear normal.     Left Ear: External  ear normal.     Nose: Nose normal.  Eyes:     Extraocular Movements: Extraocular movements intact.     Conjunctiva/sclera: Conjunctivae normal.     Pupils: Pupils are equal, round, and reactive to light.  Cardiovascular:     Rate and Rhythm: Normal rate and regular rhythm.     Heart sounds: Normal heart sounds.     Comments: Chest pain reproducible.  Radial pulses 2+ bilaterally. Pulmonary:     Effort: Pulmonary effort is normal. No respiratory distress.     Breath sounds: Normal breath sounds.  Musculoskeletal:     Cervical back: Normal range of motion and neck supple.     Right lower leg: No edema.     Left lower leg: No edema.  Skin:    General: Skin is warm and dry.   Neurological:     Mental Status: He is alert.     Comments: Right gaze deviation.  Decreased strength in right arm and right leg with right-sided facial droop.  Some difficulty with fluency of speech.  Psychiatric:        Mood and Affect: Mood normal.        Behavior: Behavior normal.     ED Results / Procedures / Treatments   Labs (all labs ordered are listed, but only abnormal results are displayed) Labs Reviewed  BASIC METABOLIC PANEL WITH GFR - Abnormal; Notable for the following components:      Result Value   CO2 21 (*)    All other components within normal limits  I-STAT CHEM 8, ED - Abnormal; Notable for the following components:   Calcium, Ion 1.06 (*)    TCO2 21 (*)    All other components within normal limits  MRSA NEXT GEN BY PCR, NASAL  CBC  PROTIME-INR  APTT  CBC  DIFFERENTIAL  ETHANOL  COMPREHENSIVE METABOLIC PANEL WITH GFR  RAPID URINE DRUG SCREEN, HOSP PERFORMED  URINALYSIS, ROUTINE W REFLEX MICROSCOPIC  LIPID PANEL  HEMOGLOBIN A1C  HIV ANTIBODY (ROUTINE TESTING W REFLEX)  TROPONIN I (HIGH SENSITIVITY)  TROPONIN I (HIGH SENSITIVITY)    EKG EKG Interpretation Date/Time:  Monday May 23 2023 18:41:01 EDT Ventricular Rate:  53 PR Interval:  182 QRS Duration:  123 QT Interval:  436 QTC Calculation: 410 R Axis:   57  Text Interpretation: Sinus rhythm LVH with secondary repolarization abnormality Confirmed by Vonita Moss [phone]) on 05/23/2023 6:43:42 PM  Radiology CT ANGIO CHEST/ABD/PEL FOR DISSECTION W &/OR WO CONTRAST Result Date: 05/23/2023 CLINICAL DATA:  Right-sided weakness, chest pain EXAM: CT ANGIOGRAPHY CHEST, ABDOMEN AND PELVIS TECHNIQUE: Non-contrast CT of the chest was initially obtained. Multidetector CT imaging through the chest, abdomen and pelvis was performed using the standard protocol during bolus administration of intravenous contrast. Multiplanar reconstructed images and MIPs were obtained and reviewed to evaluate the vascular  anatomy. RADIATION DOSE REDUCTION: This exam was performed according to the departmental dose-optimization program which includes automated exposure control, adjustment of the mA and/or kV according to patient size and/or use of iterative reconstruction technique. CONTRAST:  OMNIPAQUE IOHEXOL 350 MG/ML SOLN COMPARISON:  01/16/2022, 08/30/2022, 05/20/2023 FINDINGS: CTA CHEST FINDINGS Cardiovascular: This is a limited evaluation of the thoracic aorta due to timing of contrast bolus. No gross evidence of thoracic aortic aneurysm or dissection. The heart is unremarkable without pericardial effusion. Insufficient opacification of the pulmonary vasculature limits evaluation for pulmonary emboli. Mediastinum/Nodes: No enlarged mediastinal, hilar, or axillary lymph nodes. Thyroid gland, trachea, and esophagus demonstrate no  significant findings. Lungs/Pleura: Stable upper lobe predominant emphysema. No acute airspace disease, effusion, or pneumothorax. Bilateral lower lobe subsegmental atelectasis. Central airways are patent. Musculoskeletal: No acute or destructive bony abnormalities. Reconstructed images demonstrate no additional findings. Review of the MIP images confirms the above findings. CTA ABDOMEN AND PELVIS FINDINGS VASCULAR Evaluation is slightly limited due to timing of contrast bolus. Aorta: Normal caliber aorta without aneurysm, dissection, vasculitis or significant stenosis. Mild atherosclerosis. Celiac: Patent without evidence of aneurysm, dissection, vasculitis or significant stenosis. SMA: Patent without evidence of aneurysm, dissection, vasculitis or significant stenosis. Renals: Both renal arteries are patent without evidence of aneurysm, dissection, vasculitis, fibromuscular dysplasia or significant stenosis. IMA: Patent without evidence of aneurysm, dissection, vasculitis or significant stenosis. Inflow: 1.7 cm right common iliac artery aneurysm. Atherosclerosis of the bilateral common iliac  arteries. No evidence of dissection, critical stenosis, or vasculitis. Veins: No obvious venous abnormality within the limitations of this arterial phase study. Review of the MIP images confirms the above findings. NON-VASCULAR Hepatobiliary: No focal liver abnormality is seen. No gallstones, gallbladder wall thickening, or biliary dilatation. Pancreas: Unremarkable. No pancreatic ductal dilatation or surrounding inflammatory changes. Spleen: Normal in size without focal abnormality. Adrenals/Urinary Tract: The adrenals and kidneys are stable. No urinary tract calculi or obstructive uropathy. Bladder is unremarkable. Stomach/Bowel: No bowel obstruction or ileus. Normal appendix right lower quadrant. No bowel wall thickening or inflammatory change. Small hiatal hernia. Lymphatic: No pathologic adenopathy. Reproductive: Stable enlargement of the prostate. Other: No free fluid or free intraperitoneal gas. No abdominal wall hernia. Musculoskeletal: No acute or destructive bony abnormalities. Reconstructed images demonstrate no additional findings. Review of the MIP images confirms the above findings. IMPRESSION: Vascular: 1. Limited evaluation due to timing of contrast bolus. 2. No evidence of thoracoabdominal aortic aneurysm or dissection. 3. Incidental 1.7 cm right common iliac artery aneurysm. 4. Nondiagnostic evaluation of the pulmonary vasculature. 5.  Aortic Atherosclerosis (ICD10-I70.0). Nonvascular: 1. No acute intrathoracic, intra-abdominal, or intrapelvic process. 2. Aortic Atherosclerosis (ICD10-I70.0) and Emphysema (ICD10-J43.9). 3. Stable enlarged prostate. Electronically Signed   By: Sharlet Salina M.D.   On: 05/23/2023 18:54   CT ANGIO HEAD NECK W WO CM W PERF (CODE STROKE) Result Date: 05/23/2023 CLINICAL DATA:  Neuro deficit, concern for stroke EXAM: CT ANGIOGRAPHY OF THE HEAD AND NECK CT PERFUSION BRAIN TECHNIQUE: Contiguous axial images were obtained from the base of the skull through the vertex  without intravenous contrast. Multidetector CT imaging of the head and neck was performed using the standard protocol during bolus administration of intravenous contrast. Multiplanar CT image reconstructions and MIPs were obtained to evaluate the vascular anatomy. Carotid stenosis measurements (when applicable) are obtained utilizing NASCET criteria, using the distal internal carotid diameter as the denominator. Multiphase CT imaging of the brain was performed following IV bolus contrast injection. Subsequent parametric perfusion maps were calculated using RAPID software. RADIATION DOSE REDUCTION: This exam was performed according to the departmental dose-optimization program which includes automated exposure control, adjustment of the mA and/or kV according to patient size and/or use of iterative reconstruction technique. CONTRAST:  OMNIPAQUE IOHEXOL 350 MG/ML SOLN COMPARISON:  Same day CT head.  CTA head 07/01/2015. FINDINGS: CTA NECK FINDINGS Aortic arch: Common origin of the brachiocephalic and left common carotid arteries. Imaged portion shows no evidence of aneurysm or dissection. No significant stenosis of the major arch vessel origins. Pulmonary arteries: As permitted by contrast timing, there are no filling defects in the visualized pulmonary arteries. Subclavian arteries: The subclavian arteries are patent  bilaterally. Right carotid system: No evidence of dissection, stenosis (50% or greater), or occlusion. Left carotid system: No evidence of dissection, stenosis (50% or greater), or occlusion. Vertebral arteries: Codominant. No evidence of dissection, stenosis (50% or greater), or occlusion. Skeleton: No acute or aggressive finding. Degenerative changes in the cervical spine with disc space narrowing greatest at C4-5. Dental caries. Other neck: The visualized airway is patent. No cervical lymphadenopathy. Upper chest: Paraseptal emphysema noted. Review of the MIP images confirms the above findings  CTA HEAD FINDINGS ANTERIOR CIRCULATION: The intracranial ICAs are patent bilaterally. No significant stenosis, proximal occlusion, aneurysm, or vascular malformation. MCAs: The middle cerebral arteries are patent bilaterally. ACAs: The anterior cerebral arteries are patent bilaterally. POSTERIOR CIRCULATION: No significant stenosis, proximal occlusion, aneurysm, or vascular malformation. PCAs: The posterior cerebral arteries are patent bilaterally. Pcomm: The posterior communicating arteries are visualized bilaterally. SCAs: The superior cerebellar arteries are patent bilaterally. Basilar artery: Patent AICAs: Visualized on the right. PICAs: Visualized on the left. Vertebral arteries: The intracranial vertebral arteries are patent. Venous sinuses: As permitted by contrast timing, patent. Anatomic variants: None Review of the MIP images confirms the above findings CT Brain Perfusion Findings: ASPECTS: 10 CBF (<30%) Volume: 0mL Perfusion (Tmax>6.0s) volume: 0mL Mismatch Volume: 0mL Infarction Location:None identified IMPRESSION: No large vessel occlusion. No high-grade stenosis of the arteries in the head and neck. No core infarct identified on CT perfusion. Electronically Signed   By: Emily Filbert M.D.   On: 05/23/2023 18:39   CT HEAD CODE STROKE WO CONTRAST Result Date: 05/23/2023 CLINICAL DATA:  Code stroke. Right-sided numbness and right-sided weakness, difficulty talking. EXAM: CT HEAD WITHOUT CONTRAST TECHNIQUE: Contiguous axial images were obtained from the base of the skull through the vertex without intravenous contrast. RADIATION DOSE REDUCTION: This exam was performed according to the departmental dose-optimization program which includes automated exposure control, adjustment of the mA and/or kV according to patient size and/or use of iterative reconstruction technique. COMPARISON:  MRI head 07/02/2015 FINDINGS: Brain: No acute intracranial hemorrhage. No CT evidence of acute infarct. Nonspecific  hypoattenuation in the periventricular and subcortical white matter favored to reflect chronic microvascular ischemic changes. No edema, mass effect, or midline shift. The basilar cisterns are patent. Ventricles: The ventricles are normal. Vascular: No hyperdense vessel or unexpected calcification. Skull: No acute or aggressive finding. Orbits: Orbits are symmetric. Sinuses: The visualized paranasal sinuses are clear. Other: Mastoid air cells are clear. ASPECTS [facility] Stroke Program Early CT Score) - Ganglionic level infarction (caudate, lentiform nuclei, internal capsule, insula, M1-M3 cortex): 7 - Supraganglionic infarction (M4-M6 cortex): 3 Total score (0-10 with 10 being normal): 10 IMPRESSION: 1. No CT evidence of acute intracranial abnormality. 2. Mild chronic microvascular ischemic changes. 3. ASPECTS is 10 These results were communicated to Dr. Armanda Magic At 6:01 pm on 05/23/2023 by text page via the [facility] messaging system. Electronically Signed   By: Emily Filbert M.D.   On: 05/23/2023 18:05    Procedures Procedures    Medications Ordered in ED Medications  clevidipine (CLEVIPREX) infusion 0.5 mg/mL (2 mg/hr Intravenous New Bag/Given 05/23/23 1928)   stroke: early stages of recovery book (has no administration in time range)  0.9 %  sodium chloride infusion ( Intravenous New Bag/Given 05/23/23 1858)  acetaminophen (TYLENOL) tablet 650 mg (has no administration in time range)    Or  acetaminophen (TYLENOL) 160 MG/5ML solution 650 mg (has no administration in time range)    Or  acetaminophen (TYLENOL) suppository 650 mg (has no administration in  time range)  senna-docusate (Senokot-S) tablet 1 tablet (has no administration in time range)  pantoprazole (PROTONIX) injection 40 mg (has no administration in time range)  Chlorhexidine Gluconate Cloth 2 % PADS 6 each (has no administration in time range)  tenecteplase (TNKASE) injection for Stroke 19 mg (19 mg Intravenous Given by Other 05/23/23 1804)   iohexol (OMNIPAQUE) 350 MG/ML injection 100 mL (100 mLs Intravenous Contrast Given 05/23/23 1820)    ED Course/ Medical Decision Making/ A&P                                 Medical Decision Making Amount and/or Complexity of Data Reviewed Labs: ordered. Radiology: ordered.  Risk Decision regarding hospitalization.   Derek French is a 58 y.o. male with comorbidities that complicate the patient evaluation including cocaine abuse with vasospasm causing a stroke who presents to the emergency department with chest pain, shortness of breath, and weakness.     Initial Ddx:  ICH, stroke, dissection, hypoglycemia, cocaine induced vasospasm  MDM/Course:  Patient presents to the emergency department with symptoms concerning for stroke.  Also is having some chest pain.  Is within the window for TNKase a code stroke was activated.  Had a Noncon head CT that did not show acute findings.  Was given TNK.  CT angio was obtained which did not shows evidence of LVO.  With his chest pain and neurologic symptoms also obtained a CTA for dissection.  This was limited due to contrast bolus timing but did not show evidence of dissection or other acute pathology that is life-threatening at this time.  Suspect this is due to his chronic pleuritic chest pain that he has been seen for several times.  Was admitted to the neuro ICU for further management.   This patient presents to the ED for concern of complaints listed in HPI, this involves an extensive number of treatment options, and is a complaint that carries with it a high risk of complications and morbidity. Disposition including potential need for admission considered.   Dispo: ICU  Additional history obtained from EMS Records reviewed Outpatient Clinic Notes The following labs were independently interpreted: Chemistry and show no acute abnormality I independently reviewed the following imaging with scope of interpretation limited to determining acute  life threatening conditions related to emergency care: CT Head and agree with the radiologist interpretation with the following exceptions: none I personally reviewed and interpreted cardiac monitoring: normal sinus rhythm  I personally reviewed and interpreted the pt's EKG: see above for interpretation  I have reviewed the patients home medications and made adjustments as needed Consults: Neurology Social Determinants of health:  Cocaine abuse  Portions of this note were generated with Scientist, clinical (histocompatibility and immunogenetics). Dictation errors may occur despite best attempts at proofreading.    CRITICAL CARE Performed by: Rondel Baton   Total critical care time: 30 minutes  Critical care time was exclusive of separately billable procedures and treating other patients.  Critical care was necessary to treat or prevent imminent or life-threatening deterioration.  Critical care was time spent personally by me on the following activities: development of treatment plan with patient and/or surrogate as well as nursing, discussions with consultants, evaluation of patient's response to treatment, examination of patient, obtaining history from patient or surrogate, ordering and performing treatments and interventions, ordering and review of laboratory studies, ordering and review of radiographic studies, pulse oximetry and re-evaluation of patient's condition.  Final Clinical Impression(s) / ED Diagnoses Final diagnoses:  Stroke-like symptoms  Chest pain, unspecified type    Rx / DC Orders ED Discharge Orders     None         Rondel Baton, MD 05/23/23 [phone]

## 2023-05-23 NOTE — ED Notes (Signed)
 Patient bleeding from mouth and back of head. Patient endorses to RN Morrie Sheldon he was shaving before he came to hospital today.

## 2023-05-23 NOTE — Plan of Care (Signed)
 Called by RN - pt with bleeding from gums, stable from ER. No change in exam. S/P TNK. Monitor clinically and check CBC in AM.   Milon Dikes, MD On call neurologist

## 2023-05-23 NOTE — Code Documentation (Signed)
 Stroke Response Nurse Documentation Code Documentation  Derek French is a 58 y.o. male arriving to [facility]  via [employer] on 3/31 with past medical hx of CVA, htn, substance abuse. On No antithrombotic. Code stroke was activated by ED.   Patient from ED where he was LKW at 1715 while waiting to be seen for chest pain and shortness of breath, which was his original complaint on arrival to the ED. He was rounded on by staff in the lobby and now complaining of aphasia, R sided weakness, and slurred speech. Patient re-evaluated and code stroke activated.    Patient to CT with team. NIHSS 8, see documentation for details and code stroke times. Patient with disoriented, right hemianopia, right arm weakness, right leg weakness, right decreased sensation, Expressive aphasia , and dysarthria  on exam. The following imaging was completed:  CT Head and CTA. Patient is a candidate for IV Thrombolytic due to fixed neurological deficit within 4.5 hours of LKW. Patient is not a candidate for IR due to no LVO on CTA.   Care Plan: q15 NIHSS and vitals x 2 hours, q30 x 6 hours, q1 hr x 16 hours.   Bedside handoff with ED RN Dahlia Client.    Pearlie Oyster  Stroke Response RN

## 2023-05-23 NOTE — H&P (Signed)
 NEUROLOGY H&P NOTE   Date of service: May 23, 2023 Patient Name: Derek French MRN:  [id number] DOB:  [birth year] Chief Complaint: "SOB and CP"  History of Present Illness  Derek French is a 58 y.o. 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   Presented to ED with complaints of shortness of breath and chest pain.  He was given aspirin and nitroglycerin by EMS prior to arrival.  Initially seen in triage with no neurological deficits.  Approximately 1715 he started experiencing right-sided weakness and numbness.  Code stroke was activated.  On neurological exam, patient has expressive aphasia, dysarthria, right hemianopia, right arm and leg weakness, right side sensory deficit. CTH negative.  Patient denied headache, recent illnesses, recent drug use.  Management with thrombolytic therapy was explained to the patient, or patient's representative, as were risks, benefits and alternatives. All questions were answered. Patient, or patient's representative, expressed understanding of the treatment plan and agreed to proceed with thrombolytic treatment.  Patient consented to TNK administration, given at 33.  CT angio with perfusion was negative for LVO or core infarct.  Patient daughter Derek French was updated over the phone, all questions answered.  Patient's history is noted for strokelike episodes happening in 2011, February 2013 and August 2013.  He was seen 3/28 in [facility], ED for complaints of chest pain, described it as sharp stabbing pain that is worse with taking deep breaths.   Last known well: 1715 Modified rankin score: 0-Completely asymptomatic and back to baseline post- stroke IV Thrombolysis: Yes @ 1803 Thrombectomy: No, no LVO  NIHSS components Score: Comment  1a Level of Conscious 0[x]  1[]  2[]  3[]      1b LOC Questions 0[]  1[x]  2[]       1c LOC Commands 0[x]   1[]  2[]       2 Best Gaze 0[x]  1[]  2[]       3 Visual 0[]  1[x]  2[]  3[]      4 Facial Palsy 0[x]  1[]  2[]  3[]      5a Motor Arm - left 0[x]  1[]  2[]  3[]  4[]  UN[]    5b Motor Arm - Right 0[]  1[]  2[x]  3[]  4[]  UN[]    6a Motor Leg - Left 0[x]  1[]  2[]  3[]  4[]  UN[]    6b Motor Leg - Right 0[]  1[]  2[]  3[x]  4[]  UN[]    7 Limb Ataxia 0[x]  1[]  2[]  3[]  UN[]     8 Sensory 0[]  1[x]  2[]  UN[]      9 Best Language 0[]  1[x]  2[]  3[]      10 Dysarthria 0[]  1[x]  2[]  UN[]      11 Extinct. and Inattention 0[x]  1[]  2[]       TOTAL:   10      ROS  Comprehensive ROS performed and pertinent positives documented in the HPI  Past History   Past Medical History:  Diagnosis Date   Anemia due to GI blood loss 01/16/2022   Chest pain 08/30/2022   Encounter for hepatitis C screening test for low risk patient 01/25/2022   Hypertension 05/03/2019   Recurrent boils    Stroke-like episode    07/2009, 03/2011, 09/2011   Past Surgical History:  Procedure Laterality Date   BIOPSY  01/19/2022   Procedure: BIOPSY;  Surgeon: Derek Lucks, MD;  Location: [facility] ENDOSCOPY;  Service: Gastroenterology;;   COLONOSCOPY WITH PROPOFOL N/A 01/18/2022   Procedure: COLONOSCOPY WITH PROPOFOL;  Surgeon: Derek Lucks, MD;  Location: [facility] ENDOSCOPY;  Service: Gastroenterology;  Laterality: N/A;   ESOPHAGOGASTRODUODENOSCOPY N/A 01/19/2022   Procedure: ESOPHAGOGASTRODUODENOSCOPY (EGD);  Surgeon: Derek Lucks, MD;  Location: [facility] ENDOSCOPY;  Service: Gastroenterology;  Laterality: N/A;   HERNIA REPAIR     Family History  Problem Relation Age of Onset   Stroke Father    Heart disease Father 39       CABG   Social History   Socioeconomic History   Marital status: Single    Spouse name: Not on file   Number of children: Not on file   Years of education: Not on file   Highest education level: Not on file  Occupational History   Not on file  Tobacco Use   Smoking status: Every Day    Current packs/day: 0.50    Average packs/day:  0.5 packs/day for 21.9 years (10.9 ttl pk-yrs)    Types: Cigarettes    Start date: 07/04/2001   Smokeless tobacco: Never  Vaping Use   Vaping status: Never Used  Substance and Sexual Activity   Alcohol use: Yes    Alcohol/week: 1.0 standard drink of alcohol    Types: 1 Cans of beer per week    Comment: occ   Drug use: Yes    Types: Marijuana, Cocaine    Comment: relapsed 1 week ago   Sexual activity: Yes    Birth control/protection: Condom  Other Topics Concern   Not on file  Social History Narrative   Lives with mom.  Drives a forklift.  One daughter   Social Drivers of Corporate investment banker Strain: Not on file  Food Insecurity: No Food Insecurity (08/30/2022)   Hunger Vital Sign    Worried About Running Out of Food in the Last Year: Never true    Ran Out of Food in the Last Year: Never true  Transportation Needs: No Transportation Needs (08/30/2022)   PRAPARE - Administrator, Civil Service (Medical): No    Lack of Transportation (Non-Medical): No  Physical Activity: Not on file  Stress: Not on file  Social Connections: Not on file   Allergies  Allergen Reactions   Bee Venom Anaphylaxis   Codeine Other (See Comments)    hallucinations     Medications   Current Facility-Administered Medications:    tenecteplase (TNKASE) injection for Stroke 19 mg, 0.25 mg/kg, Intravenous, Once, Derek Oshields, MD  Current Outpatient Medications:    amLODipine (NORVASC) 10 MG tablet, Take 1 tablet (10 mg total) by mouth daily., Disp: 30 tablet, Rfl: 0   atorvastatin (LIPITOR) 80 MG tablet, Take 1 tablet (80 mg total) by mouth daily., Disp: 30 tablet, Rfl: 0   diclofenac Sodium (VOLTAREN) 1 % GEL, Apply 4 g topically 4 (four) times daily., Disp: , Rfl:    indomethacin (INDOCIN) 50 MG capsule, Take 1 capsule (50 mg total) by mouth 2 (two) times daily with a meal., Disp: 30 capsule, Rfl: 0   lidocaine (LIDODERM) 5 %, Place 1 patch onto the skin daily. Remove &  Discard patch within 12 hours or as directed by MD, Disp: 30 patch, Rfl: 0   naproxen sodium (ALEVE) 220 MG tablet, Take 440 mg by mouth as needed (pain)., Disp: , Rfl:    pantoprazole (PROTONIX) 40 MG tablet, Take 1 tablet (40 mg total) by mouth 2 (two) times daily., Disp: 60 tablet, Rfl: 0   Vitals   Vitals:   05/23/23 1621 05/23/23 1622 05/23/23 1623 05/23/23 1625  BP:  132/85  Pulse:  65    Resp:    (!) 21  Temp:    98.2 F (36.8 C)  TempSrc:    Oral  SpO2: 100% 100%    Weight:   77.1 kg   Height:   6\' 2"  (1.88 m)      Body mass index is 21.82 kg/m.  Physical Exam   Constitutional: Appears well-developed and well-nourished.  Psych: Affect appropriate to situation.  Eyes: No scleral injection.  HENT: No OP obstruction.  Head: Normocephalic.  Cardiovascular: Normal rate and regular rhythm.  Respiratory: Effort normal, non-labored breathing.  GI: Soft.  No distension. There is no tenderness.  Skin: WDI.   Neurologic Examination   Neuro: Mental Status: Patient is awake, alert, oriented to person, place, month, year, and situation. Expressive aphasia and dysarthria present.   Cranial Nerves: II: Right hemianopia. Pupils are equal, round, and reactive to light.   III,IV, VI: EOMI without ptosis or diploplia.  V: Facial sensation is symmetric to light touch VII: Facial movement is symmetric.  VIII: hearing is intact to voice X: Uvula elevates symmetrically XI: Shoulder shrug is symmetric. XII: tongue is midline without atrophy or fasciculations.   Motor: Tone is normal. Bulk is normal.  Dense right hemiplegia present RUE: 4/5 throughout, mild drift RLE: 3/5 throughout, severe drift  Sensory: Sensation is reduced in right arm and leg  Cerebellar: Unable to perform FNF or HKS on right side.    Labs   CBC:  Recent Labs  Lab 05/20/23 0852 05/23/23 1703  WBC 8.0 5.0  HGB 15.5 15.2  HCT 45.6 44.0  MCV 94.4 92.1  PLT 249 248   Basic Metabolic  Panel:  Lab Results  Component Value Date   NA 138 05/23/2023   K 3.8 05/23/2023   CO2 21 (L) 05/23/2023   GLUCOSE 98 05/23/2023   BUN 8 05/23/2023   CREATININE 1.01 05/23/2023   CALCIUM 9.4 05/23/2023   GFRNONAA >60 05/23/2023   GFRAA 85 05/02/2019   Lipid Panel:  Lab Results  Component Value Date   LDLCALC 122 (H) 08/31/2022   HgbA1c:  Lab Results  Component Value Date   HGBA1C 6.1 (H) 01/17/2022   Urine Drug Screen:     Component Value Date/Time   LABOPIA POSITIVE (A) 08/30/2022 1424   COCAINSCRNUR NONE DETECTED 08/30/2022 1424   LABBENZ NONE DETECTED 08/30/2022 1424   AMPHETMU NONE DETECTED 08/30/2022 1424   THCU NONE DETECTED 08/30/2022 1424   LABBARB NONE DETECTED 08/30/2022 1424    Alcohol Level     Component Value Date/Time   ETH <5 07/01/2015 1820   INR  Lab Results  Component Value Date   INR 1.1 01/16/2022   APTT  Lab Results  Component Value Date   APTT 34 01/16/2022     CT Head without contrast(Personally reviewed): No CT evidence of acute intracranial abnormality. Mild chronic microvascular ischemic changes. ASPECTS is 10  CT angio Head and Neck with contrast(Personally reviewed): No large vessel occlusion. No high-grade stenosis of the arteries in the head and neck. No core infarct identified on CT perfusion.  MRI Brain(Personally reviewed): PENDING   Assessment   DAILEN MCCLISH is a 58 y.o. 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   Presented to ED with complaints of shortness of breath and chest pain.  He was given aspirin and nitroglycerin by EMS prior to arrival.  Initially seen in triage with no neurological deficits.  Approximately 1715 he started experiencing right-sided weakness and numbness.  Code stroke was activated.  On neurological exam, patient has expressive aphasia, dysarthria, right hemianopia,  right arm and leg weakness, right side sensory deficit. CTH negative. Patient consented to TNK administration, given at 56.  CT angio with perfusion was negative for LVO or core infarct.  Primary Diagnosis:  Stroke determined by clinical symptoms  Impression: acute onset RHB weakness and numbness is concerning for an acute ischemic stroke, potentially in distal left MCA branch vs left basal ganglia. Discussed benefits and risks of TNK including brain bleed and death. Patient consented to receive TNK. No contraindications per discussion with patient.   Patient underwent CTA C/A/P due to shortness of breath and some vague chest pain; without any evidence of dissection. It did reveal incidental R common iliac aneurysm for which he should follow up with Vascular Surgery ouppatient.    Recommendations  - Frequent Neuro checks per stroke unit protocol - MRI Brain stroke protocol - TTE with bubble study  - Lipid panel - Statin - will be started if LDL>70 or otherwise medically indicated - A1C - Antithrombotic - hold until 24hour post-TNK imaging is performed and stable - DVT ppx - SCDs - Smoking cessation - will counsel patient - SBP goal - <180/105, PRN labetalol if HR>60 and PRN Hydralazine if HR<60 - Telemetry monitoring for arrhythmia - 72h - Swallow screen - will be performed prior to PO intake - Stroke education - will be given - Discussed tobacco use cessation, patient will reconsider int he future.  - PT/OT/SLP - Dispo: admit to ICU  ______________________________________________________________________   Pt seen by Neuro NP/APP and later by MD. Note/plan to be edited by MD as needed.    Derek January, DNP, AGACNP-BC [facility] Please use AMION for contact information & EPIC for messaging.   Risks, benefits and alternatives of IVT discussed with patient and/or family and they agreed. CTH personally reviewed prior to TNK administration  I personally evaluated  patient, reviewed images and edited this note.   Derek Henderson, MD [facility]

## 2023-05-23 NOTE — ED Notes (Signed)
 Pt called this RN over in waiting room stating that he felt that he could not talk, R sided numbness, and R sided weakness. Pt taken to triage, evaluated by PA Mellody Dance. Activated as Code Stroke. Per pt he started feeling this way approx. 25 minutes ago. LKW 1715.

## 2023-05-23 NOTE — ED Notes (Addendum)
 Awaiting patient from CT, patient in CT with [facility]

## 2023-05-23 NOTE — ED Triage Notes (Signed)
 Patient BIB EMS from home c/o left sided chest pain and shortness of breath. Pt diagnosed with pleurisy Thursday, but the pain has worsened. Nitroglycerin and aspirin given en route.   180/110 before nitroglycerin 149/97 after nitroglycerin 89 HR 100% RA

## 2023-05-24 ENCOUNTER — Inpatient Hospital Stay (HOSPITAL_COMMUNITY)

## 2023-05-24 DIAGNOSIS — R531 Weakness: Secondary | ICD-10-CM | POA: Diagnosis not present

## 2023-05-24 DIAGNOSIS — F17210 Nicotine dependence, cigarettes, uncomplicated: Secondary | ICD-10-CM

## 2023-05-24 DIAGNOSIS — R4701 Aphasia: Secondary | ICD-10-CM | POA: Diagnosis not present

## 2023-05-24 DIAGNOSIS — H538 Other visual disturbances: Secondary | ICD-10-CM | POA: Diagnosis not present

## 2023-05-24 DIAGNOSIS — E785 Hyperlipidemia, unspecified: Secondary | ICD-10-CM

## 2023-05-24 DIAGNOSIS — R200 Anesthesia of skin: Secondary | ICD-10-CM

## 2023-05-24 DIAGNOSIS — R0609 Other forms of dyspnea: Secondary | ICD-10-CM

## 2023-05-24 DIAGNOSIS — R079 Chest pain, unspecified: Secondary | ICD-10-CM

## 2023-05-24 DIAGNOSIS — F1921 Other psychoactive substance dependence, in remission: Secondary | ICD-10-CM

## 2023-05-24 LAB — BASIC METABOLIC PANEL WITH GFR
Anion gap: 12 (ref 5–15)
BUN: 10 mg/dL (ref 6–20)
CO2: 19 mmol/L — ABNORMAL LOW (ref 22–32)
Calcium: 8.9 mg/dL (ref 8.9–10.3)
Chloride: 105 mmol/L (ref 98–111)
Creatinine, Ser: 0.87 mg/dL (ref 0.61–1.24)
GFR, Estimated: >60 mL/min (ref >60)
Glucose, Bld: 88 mg/dL (ref 70–99)
Potassium: 3.9 mmol/L (ref 3.5–5.1)
Sodium: 136 mmol/L (ref 135–145)

## 2023-05-24 LAB — RAPID URINE DRUG SCREEN, HOSP PERFORMED
Amphetamines: NOT DETECTED
Barbiturates: NOT DETECTED
Benzodiazepines: NOT DETECTED
Cocaine: NOT DETECTED
Opiates: NOT DETECTED
Tetrahydrocannabinol: NOT DETECTED

## 2023-05-24 LAB — CBC
HCT: 44.5 % (ref 39.0–52.0)
Hemoglobin: 15.5 g/dL (ref 13.0–17.0)
MCH: 32 pg (ref 26.0–34.0)
MCHC: 34.8 g/dL (ref 30.0–36.0)
MCV: 91.8 fL (ref 80.0–100.0)
Platelets: 243 10*3/uL (ref 150–400)
RBC: 4.85 MIL/uL (ref 4.22–5.81)
RDW: 13.7 % (ref 11.5–15.5)
WBC: 5.4 10*3/uL (ref 4.0–10.5)
nRBC: 0 % (ref 0.0–0.2)

## 2023-05-24 LAB — URINALYSIS, ROUTINE W REFLEX MICROSCOPIC
Bilirubin Urine: NEGATIVE
Glucose, UA: NEGATIVE mg/dL
Hgb urine dipstick: NEGATIVE
Ketones, ur: NEGATIVE mg/dL
Leukocytes,Ua: NEGATIVE
Nitrite: NEGATIVE
Protein, ur: NEGATIVE mg/dL
Specific Gravity, Urine: 1.011 (ref 1.005–1.030)
pH: 7 (ref 5.0–8.0)

## 2023-05-24 LAB — ECHOCARDIOGRAM COMPLETE
AR max vel: 2.7 cm2
AV Area VTI: 3.02 cm2
AV Area mean vel: 2.65 cm2
AV Mean grad: 6 mmHg
AV Peak grad: 14.4 mmHg
Ao pk vel: 1.9 m/s
Area-P 1/2: 2.46 cm2
Height: 74 in
P 1/2 time: 645 ms
S' Lateral: 2.7 cm
Weight: 2564.39 [oz_av]

## 2023-05-24 LAB — LIPID PANEL
Cholesterol: 188 mg/dL (ref 0–200)
HDL: 45 mg/dL (ref >40)
LDL Cholesterol: 132 mg/dL — ABNORMAL HIGH (ref 0–99)
Total CHOL/HDL Ratio: 4.2 ratio
Triglycerides: 55 mg/dL (ref <150)
VLDL: 11 mg/dL (ref 0–40)

## 2023-05-24 LAB — GLUCOSE, CAPILLARY: Glucose-Capillary: 131 mg/dL — ABNORMAL HIGH (ref 70–99)

## 2023-05-24 MED ORDER — PANTOPRAZOLE SODIUM 40 MG PO TBEC
40.0000 mg | DELAYED_RELEASE_TABLET | Freq: Every day | ORAL | Status: DC
  Administered 2023-05-25 – 2023-05-26 (×2): 40 mg via ORAL
  Filled 2023-05-24 (×2): qty 1

## 2023-05-24 MED ORDER — ORAL CARE MOUTH RINSE: 15.0000 mL | OROMUCOSAL | Status: DC | PRN

## 2023-05-24 MED ORDER — ASPIRIN 81 MG PO TBEC
81.0000 mg | DELAYED_RELEASE_TABLET | Freq: Every day | ORAL | Status: DC
  Administered 2023-05-24 – 2023-05-26 (×3): 81 mg via ORAL
  Filled 2023-05-24 (×3): qty 1

## 2023-05-24 MED ORDER — ENOXAPARIN SODIUM 40 MG/0.4ML IJ SOSY
40.0000 mg | PREFILLED_SYRINGE | INTRAMUSCULAR | Status: DC
  Administered 2023-05-24 – 2023-05-25 (×2): 40 mg via SUBCUTANEOUS
  Filled 2023-05-24 (×2): qty 0.4

## 2023-05-24 MED ORDER — LORAZEPAM 2 MG/ML IJ SOLN
INTRAMUSCULAR | Status: AC
  Filled 2023-05-24: qty 1

## 2023-05-24 MED ORDER — LORAZEPAM 1 MG PO TABS
2.0000 mg | ORAL_TABLET | Freq: Once | ORAL | Status: AC
  Administered 2023-05-24: 2 mg via ORAL
  Filled 2023-05-24: qty 2

## 2023-05-24 MED ORDER — ATORVASTATIN CALCIUM 80 MG PO TABS
80.0000 mg | ORAL_TABLET | Freq: Every day | ORAL | Status: DC
  Administered 2023-05-24 – 2023-05-26 (×3): 80 mg via ORAL
  Filled 2023-05-24 (×3): qty 1

## 2023-05-24 MED ORDER — PERFLUTREN LIPID MICROSPHERE
1.0000 mL | INTRAVENOUS | Status: DC | PRN
  Administered 2023-05-24: 2 mL via INTRAVENOUS

## 2023-05-24 MED ORDER — NICOTINE 21 MG/24HR TD PT24
21.0000 mg | MEDICATED_PATCH | Freq: Every day | TRANSDERMAL | Status: DC
  Administered 2023-05-24 – 2023-05-26 (×3): 21 mg via TRANSDERMAL
  Filled 2023-05-24 (×3): qty 1

## 2023-05-24 MED ORDER — LORAZEPAM 2 MG/ML IJ SOLN
2.0000 mg | INTRAMUSCULAR | Status: AC
  Administered 2023-05-24: 2 mg via INTRAVENOUS

## 2023-05-24 NOTE — TOC CAGE-AID Note (Signed)
 Transition of Care [facility]) - CAGE-AID Screening   Patient Details  Name: Derek French MRN: [id number] Date of Birth: 1965-06-06  Transition of Care [facility]) CM/SW Contact:    Mearl Latin, LCSW Phone Number: 05/24/2023, 1:24 PM   Clinical Narrative: Patient denied current cocaine use but reported marijuana use sometimes. He does not feel it is an issue for him but did accept community resources just in case.   CAGE-AID Screening:    Have You Ever Felt You Ought to Cut Down on Your Drinking or Drug Use?: No Have People Annoyed You By Critizing Your Drinking Or Drug Use?: No Have You Felt Bad Or Guilty About Your Drinking Or Drug Use?: No Have You Ever Had a Drink or Used Drugs First Thing In The Morning to Steady Your Nerves or to Get Rid of a Hangover?: No CAGE-AID Score: 0  Substance Abuse Education Offered: Yes  Substance abuse interventions: Transport planner

## 2023-05-24 NOTE — Evaluation (Signed)
 Physical Therapy Evaluation Patient Details Name: Derek French MRN: [id number] DOB: 31-May-1965 Today's Date: 05/24/2023  History of Present Illness  58 yo male presenting 3/31 with chest pain SOB and weakness. CT negative for infarct s/p TNK. 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 Pt is pleasant and very motivated to participate. Pt presents with right sided weakness, impaired sensation, gait abnormalities and visual changes. Pt ambulating ~20 ft with no assistive device and min assist for balance. Demonstrates R steppage gait and increased hip hiking. With visual targets and cueing, pt able to achieve R heel strike at initial contact and translate into remainder of gait. Worked on multidirectional stepping I.e. lateral stepping for functional transfers. Suspect excellent progress given age, PLOF, and motivation. Patient will benefit from intensive inpatient follow-up therapy, >3 hours/day.        If plan is discharge home, recommend the following: A little help with walking and/or transfers;A little help with bathing/dressing/bathroom;Assistance with cooking/housework;Assist for transportation;Help with stairs or ramp for entrance   Can travel by private vehicle        Equipment Recommendations Other (comment) (TBA)  Recommendations for Other Services  Rehab consult    Functional Status Assessment Patient has had a recent decline in their functional status and demonstrates the ability to make significant improvements in function in a reasonable and predictable amount of time.     Precautions / Restrictions Precautions Precautions: Fall Recall of Precautions/Restrictions: Intact Restrictions Weight Bearing Restrictions Per Provider Order: No      Mobility  Bed Mobility Overal bed mobility:  Modified Independent                  Transfers Overall transfer level: Needs assistance Equipment used: None Transfers: Sit to/from Stand Sit to Stand: Contact guard assist           General transfer comment: CGA for safety    Ambulation/Gait Ambulation/Gait assistance: Min assist Gait Distance (Feet): 20 Feet Assistive device: None Gait Pattern/deviations: Step-to pattern, Decreased dorsiflexion - right, Decreased weight shift to right, Steppage Gait velocity: decreased Gait velocity interpretation: <1.31 ft/sec, indicative of household ambulator   General Gait Details: Step to pattern, steppage gait on R with increased hip hiking to clear foot, initially with decreased R heel strike, but able to correct with verbal/visual cueing. MinA due to increased lateral sway  Stairs            Wheelchair Mobility     Tilt Bed    Modified Rankin (Stroke Patients Only) Modified Rankin (Stroke Patients Only) Pre-Morbid Rankin Score: No symptoms Modified Rankin: Moderately severe disability     Balance Overall balance assessment: Mild deficits observed, not formally tested                                           Pertinent Vitals/Pain Pain Assessment Pain Assessment: No/denies pain    Home Living Family/patient expects to be discharged to:: Private residence Living Arrangements: Alone Available Help at Discharge: Available PRN/intermittently Type of Home: House Home Access: Level entry       Home Layout: One level Home Equipment: Agricultural consultant (2 wheels);Cane - single point;Tub bench;BSC/3in1 Additional Comments: works nights at [employer]- 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[employer] 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, theatre/television/film    Prior Function Prior Level of Function : Independent/Modified Independent;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 Reports numbness in toes    Cervical / Trunk Assessment Cervical / Trunk Assessment: Normal  Communication   Communication Communication: Impaired Factors Affecting Communication: Difficulty expressing self    Cognition Arousal: Alert Behavior During Therapy: WFL for tasks assessed/performed   PT - Cognitive impairments: No apparent impairments                         Following commands: Intact       Cueing Cueing Techniques: Verbal cues     General Comments General comments (skin integrity, edema, etc.): VSS    Exercises Other Exercises Other Exercises: Lateral stepping to R/L Other Exercises: Targeted stepping with R foot with emphasis on heel strike   Assessment/Plan    PT Assessment Patient needs continued PT services  PT Problem List Decreased strength;Decreased range of motion;Decreased activity tolerance;Decreased balance;Decreased mobility       PT Treatment Interventions DME instruction;Gait training;Functional mobility training;Therapeutic activities;Stair training;Therapeutic exercise;Balance training;Patient/family education    PT Goals (Current goals can be found in the Care Plan section)  Acute Rehab PT Goals Patient Stated Goal: to return to baseline PT Goal Formulation: With patient Time For Goal Achievement: 06/07/23 Potential to Achieve Goals: Good    Frequency Min 3X/week     Co-evaluation               AM-PAC PT "6 Clicks" Mobility  Outcome Measure Help  needed turning from your back to your side while in a flat bed without using bedrails?: None Help needed moving from lying on your back to sitting on the side of a flat bed without using bedrails?: None Help needed moving to and from a bed to a chair (including a wheelchair)?: A Little Help needed standing up from a chair using your arms (e.g., wheelchair or bedside chair)?: A Little Help needed to walk in hospital room?: A Little Help needed climbing 3-5 steps with a railing? : Total 6 Click Score: 18    End of Session Equipment Utilized During Treatment: Gait belt Activity Tolerance: Patient tolerated treatment well Patient left: in bed;with bed alarm set;Other (comment) (prepping to go to MRI) Nurse Communication: Mobility status PT Visit Diagnosis: Unsteadiness on feet (R26.81);Other abnormalities of gait and mobility (R26.89);Difficulty in walking, not elsewhere classified (R26.2)    Time: 7829-5621 PT Time Calculation (min) (ACUTE ONLY): 21 min   Charges:   PT Evaluation $PT Eval Moderate Complexity: 1 Mod   PT General Charges $$ ACUTE PT VISIT: 1 Visit         Lillia Pauls, PT, DPT Acute Rehabilitation Services Office [phone]   Norval Morton 05/24/2023, 3:02 PM

## 2023-05-24 NOTE — TOC CM/SW Note (Signed)
 Transition of Care [facility]) - Inpatient Brief Assessment   Patient Details  Name: Derek French MRN: [id number] Date of Birth: 1965/10/07  [facility] CM/SW Contact:    Mearl Latin, LCSW Phone Number: 05/24/2023, 1:26 PM   Clinical Narrative: Patient reports he admitted from home alone and is independent (his parents used to stay there but now his mom moved in with his sister due to needing 24/7 care). Patient does not drive but takes the bus, uber, or family and friends drive him. He confirmed his PCP. No current needs identified at this time but please place consult if needs arise.    Transition of Care Asessment: Insurance and Status: Insurance coverage has been reviewed Patient has primary care physician: Yes Home environment has been reviewed: From home Prior level of function:: Independent Prior/Current Home Services: No current home services Social Drivers of Health Review: SDOH reviewed no interventions necessary Readmission risk has been reviewed: Yes Transition of care needs: no transition of care needs at this time

## 2023-05-24 NOTE — Progress Notes (Addendum)
 STROKE TEAM PROGRESS NOTE   INTERIM HISTORY/SUBJECTIVE Seen in room, no family at the bedside. MRI later today. Okay to work with PT/OT. Ativan ordered for MRI due to claustrophobia.  Endorses a lot of life changes and stress lately regarding his mothers health. Denies drug use. He is still smoking cigarettes, nicotine patch ordered.   OBJECTIVE  CBC    Component Value Date/Time   WBC 5.4 05/24/2023 0632   RBC 4.85 05/24/2023 0632   HGB 15.5 05/24/2023 0632   HGB 12.1 (L) 01/25/2022 1537   HCT 44.5 05/24/2023 0632   HCT 34.9 (L) 01/25/2022 1537   PLT 243 05/24/2023 0632   PLT 329 01/25/2022 1537   MCV 91.8 05/24/2023 0632   MCV 94 01/25/2022 1537   MCH 32.0 05/24/2023 0632   MCHC 34.8 05/24/2023 0632   RDW 13.7 05/24/2023 0632   RDW 12.8 01/25/2022 1537   LYMPHSABS 2.4 05/23/2023 1746   MONOABS 0.6 05/23/2023 1746   EOSABS 0.1 05/23/2023 1746   BASOSABS 0.0 05/23/2023 1746    BMET    Component Value Date/Time   NA 138 05/23/2023 1842   NA 141 05/02/2019 1531   K 3.8 05/23/2023 1842   CL 106 05/23/2023 1842   CO2 19 (L) 05/23/2023 1746   GLUCOSE 79 05/23/2023 1842   BUN 9 05/23/2023 1842   BUN 18 05/02/2019 1531   CREATININE 1.00 05/23/2023 1842   CALCIUM 8.7 (L) 05/23/2023 1746   GFRNONAA >60 05/23/2023 1746    IMAGING past 24 hours CT ANGIO CHEST/ABD/PEL FOR DISSECTION W &/OR WO CONTRAST Result Date: 05/23/2023 CLINICAL DATA:  Right-sided weakness, chest pain EXAM: CT ANGIOGRAPHY CHEST, ABDOMEN AND PELVIS TECHNIQUE: Non-contrast CT of the chest was initially obtained. Multidetector CT imaging through the chest, abdomen and pelvis was performed using the standard protocol during bolus administration of intravenous contrast. Multiplanar reconstructed images and MIPs were obtained and reviewed to evaluate the vascular anatomy. RADIATION DOSE REDUCTION: This exam was performed according to the departmental dose-optimization program which includes automated exposure  control, adjustment of the mA and/or kV according to patient size and/or use of iterative reconstruction technique. CONTRAST:  OMNIPAQUE IOHEXOL 350 MG/ML SOLN COMPARISON:  01/16/2022, 08/30/2022, 05/20/2023 FINDINGS: CTA CHEST FINDINGS Cardiovascular: This is a limited evaluation of the thoracic aorta due to timing of contrast bolus. No gross evidence of thoracic aortic aneurysm or dissection. The heart is unremarkable without pericardial effusion. Insufficient opacification of the pulmonary vasculature limits evaluation for pulmonary emboli. Mediastinum/Nodes: No enlarged mediastinal, hilar, or axillary lymph nodes. Thyroid gland, trachea, and esophagus demonstrate no significant findings. Lungs/Pleura: Stable upper lobe predominant emphysema. No acute airspace disease, effusion, or pneumothorax. Bilateral lower lobe subsegmental atelectasis. Central airways are patent. Musculoskeletal: No acute or destructive bony abnormalities. Reconstructed images demonstrate no additional findings. Review of the MIP images confirms the above findings. CTA ABDOMEN AND PELVIS FINDINGS VASCULAR Evaluation is slightly limited due to timing of contrast bolus. Aorta: Normal caliber aorta without aneurysm, dissection, vasculitis or significant stenosis. Mild atherosclerosis. Celiac: Patent without evidence of aneurysm, dissection, vasculitis or significant stenosis. SMA: Patent without evidence of aneurysm, dissection, vasculitis or significant stenosis. Renals: Both renal arteries are patent without evidence of aneurysm, dissection, vasculitis, fibromuscular dysplasia or significant stenosis. IMA: Patent without evidence of aneurysm, dissection, vasculitis or significant stenosis. Inflow: 1.7 cm right common iliac artery aneurysm. Atherosclerosis of the bilateral common iliac arteries. No evidence of dissection, critical stenosis, or vasculitis. Veins: No obvious venous abnormality within the limitations of  this arterial phase  study. Review of the MIP images confirms the above findings. NON-VASCULAR Hepatobiliary: No focal liver abnormality is seen. No gallstones, gallbladder wall thickening, or biliary dilatation. Pancreas: Unremarkable. No pancreatic ductal dilatation or surrounding inflammatory changes. Spleen: Normal in size without focal abnormality. Adrenals/Urinary Tract: The adrenals and kidneys are stable. No urinary tract calculi or obstructive uropathy. Bladder is unremarkable. Stomach/Bowel: No bowel obstruction or ileus. Normal appendix right lower quadrant. No bowel wall thickening or inflammatory change. Small hiatal hernia. Lymphatic: No pathologic adenopathy. Reproductive: Stable enlargement of the prostate. Other: No free fluid or free intraperitoneal gas. No abdominal wall hernia. Musculoskeletal: No acute or destructive bony abnormalities. Reconstructed images demonstrate no additional findings. Review of the MIP images confirms the above findings. IMPRESSION: Vascular: 1. Limited evaluation due to timing of contrast bolus. 2. No evidence of thoracoabdominal aortic aneurysm or dissection. 3. Incidental 1.7 cm right common iliac artery aneurysm. 4. Nondiagnostic evaluation of the pulmonary vasculature. 5.  Aortic Atherosclerosis (ICD10-I70.0). Nonvascular: 1. No acute intrathoracic, intra-abdominal, or intrapelvic process. 2. Aortic Atherosclerosis (ICD10-I70.0) and Emphysema (ICD10-J43.9). 3. Stable enlarged prostate. Electronically Signed   By: Sharlet Salina M.D.   On: 05/23/2023 18:54   CT ANGIO HEAD NECK W WO CM W PERF (CODE STROKE) Result Date: 05/23/2023 CLINICAL DATA:  Neuro deficit, concern for stroke EXAM: CT ANGIOGRAPHY OF THE HEAD AND NECK CT PERFUSION BRAIN TECHNIQUE: Contiguous axial images were obtained from the base of the skull through the vertex without intravenous contrast. Multidetector CT imaging of the head and neck was performed using the standard protocol during bolus administration of  intravenous contrast. Multiplanar CT image reconstructions and MIPs were obtained to evaluate the vascular anatomy. Carotid stenosis measurements (when applicable) are obtained utilizing NASCET criteria, using the distal internal carotid diameter as the denominator. Multiphase CT imaging of the brain was performed following IV bolus contrast injection. Subsequent parametric perfusion maps were calculated using RAPID software. RADIATION DOSE REDUCTION: This exam was performed according to the departmental dose-optimization program which includes automated exposure control, adjustment of the mA and/or kV according to patient size and/or use of iterative reconstruction technique. CONTRAST:  OMNIPAQUE IOHEXOL 350 MG/ML SOLN COMPARISON:  Same day CT head.  CTA head 07/01/2015. FINDINGS: CTA NECK FINDINGS Aortic arch: Common origin of the brachiocephalic and left common carotid arteries. Imaged portion shows no evidence of aneurysm or dissection. No significant stenosis of the major arch vessel origins. Pulmonary arteries: As permitted by contrast timing, there are no filling defects in the visualized pulmonary arteries. Subclavian arteries: The subclavian arteries are patent bilaterally. Right carotid system: No evidence of dissection, stenosis (50% or greater), or occlusion. Left carotid system: No evidence of dissection, stenosis (50% or greater), or occlusion. Vertebral arteries: Codominant. No evidence of dissection, stenosis (50% or greater), or occlusion. Skeleton: No acute or aggressive finding. Degenerative changes in the cervical spine with disc space narrowing greatest at C4-5. Dental caries. Other neck: The visualized airway is patent. No cervical lymphadenopathy. Upper chest: Paraseptal emphysema noted. Review of the MIP images confirms the above findings CTA HEAD FINDINGS ANTERIOR CIRCULATION: The intracranial ICAs are patent bilaterally. No significant stenosis, proximal occlusion, aneurysm, or  vascular malformation. MCAs: The middle cerebral arteries are patent bilaterally. ACAs: The anterior cerebral arteries are patent bilaterally. POSTERIOR CIRCULATION: No significant stenosis, proximal occlusion, aneurysm, or vascular malformation. PCAs: The posterior cerebral arteries are patent bilaterally. Pcomm: The posterior communicating arteries are visualized bilaterally. SCAs: The superior cerebellar arteries are patent bilaterally.  Basilar artery: Patent AICAs: Visualized on the right. PICAs: Visualized on the left. Vertebral arteries: The intracranial vertebral arteries are patent. Venous sinuses: As permitted by contrast timing, patent. Anatomic variants: None Review of the MIP images confirms the above findings CT Brain Perfusion Findings: ASPECTS: 10 CBF (<30%) Volume: 0mL Perfusion (Tmax>6.0s) volume: 0mL Mismatch Volume: 0mL Infarction Location:None identified IMPRESSION: No large vessel occlusion. No high-grade stenosis of the arteries in the head and neck. No core infarct identified on CT perfusion. Electronically Signed   By: Emily Filbert M.D.   On: 05/23/2023 18:39   CT HEAD CODE STROKE WO CONTRAST Result Date: 05/23/2023 CLINICAL DATA:  Code stroke. Right-sided numbness and right-sided weakness, difficulty talking. EXAM: CT HEAD WITHOUT CONTRAST TECHNIQUE: Contiguous axial images were obtained from the base of the skull through the vertex without intravenous contrast. RADIATION DOSE REDUCTION: This exam was performed according to the departmental dose-optimization program which includes automated exposure control, adjustment of the mA and/or kV according to patient size and/or use of iterative reconstruction technique. COMPARISON:  MRI head 07/02/2015 FINDINGS: Brain: No acute intracranial hemorrhage. No CT evidence of acute infarct. Nonspecific hypoattenuation in the periventricular and subcortical white matter favored to reflect chronic microvascular ischemic changes. No edema, mass effect,  or midline shift. The basilar cisterns are patent. Ventricles: The ventricles are normal. Vascular: No hyperdense vessel or unexpected calcification. Skull: No acute or aggressive finding. Orbits: Orbits are symmetric. Sinuses: The visualized paranasal sinuses are clear. Other: Mastoid air cells are clear. ASPECTS [facility] Stroke Program Early CT Score) - Ganglionic level infarction (caudate, lentiform nuclei, internal capsule, insula, M1-M3 cortex): 7 - Supraganglionic infarction (M4-M6 cortex): 3 Total score (0-10 with 10 being normal): 10 IMPRESSION: 1. No CT evidence of acute intracranial abnormality. 2. Mild chronic microvascular ischemic changes. 3. ASPECTS is 10 These results were communicated to Dr. Armanda Magic At 6:01 pm on 05/23/2023 by text page via the [facility] messaging system. Electronically Signed   By: Emily Filbert M.D.   On: 05/23/2023 18:05    Vitals:   05/24/23 0500 05/24/23 0600 05/24/23 0700 05/24/23 0800  BP: 130/89 (!) 147/99 (!) 158/97 (!) 148/86  Pulse: 65 (!) 57 (!) 54 60  Resp: (!) 21 17 15 17   Temp:    98.2 F (36.8 C)  TempSrc:    Oral  SpO2: 95% 98% 95% 96%  Weight:      Height:         PHYSICAL EXAM General:  Alert, well-nourished, well-developed patient in no acute distress Psych:  Mood and affect appropriate for situation CV: Regular rate and rhythm on monitor Respiratory:  Regular, unlabored respirations on room air GI: Abdomen soft and nontender   NEURO:  Mental Status: AA&Ox3, patient is able to give clear and coherent history Speech/Language: Naming, repetition, fluency, and comprehension intact. Mild dysarthria with hesitancy of speech Cranial Nerves:  II: PERRL. Endorses blurry vision on the right, but able to still count fingers III, IV, VI: EOMI. Eyelids elevate symmetrically.  V: Sensation is intact to light touch and symmetrical to face.  VII: Face is symmetrical resting and smiling VIII: hearing intact to voice. IX, X: Palate elevates  symmetrically. Phonation is normal.  ZO:[id number] shrug 5/5. XII: tongue is midline without fasciculations. Motor: 5/5 strength to all muscle groups tested.  Tone: is normal and bulk is normal Sensation-  endorses tingling and numbness sensation on the right upper and lower. Still feels pressure Coordination: FTN intact bilaterally, HKS: no ataxia in BLE.No drift.  Gait- deferred  Most Recent NIH 2     ASSESSMENT/PLAN  [name] is a 58 y.o. 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  NIH on Admission 10  Possible functional neurologic disorder, but TIA on DDx Presented with right HP, HH, aphasia s/p TNK Pt presented to ED for chest pain and SOB, while in ED, he became hot all over and then felt right sided numbness tingling and then weakness. Still complaining of right sided decreased sensation and strength today but on exam some functional component Code Stroke CT head No acute abnormality. Small vessel disease. ASPECTS 10.    CTA head & neck No large vessel occlusion. No high-grade stenosis of the arteries in the head and neck. MRI  Pending  2D Echo EF 60-65% LDL 122 HgbA1c 5.3 VTE prophylaxis - SCDs Taking 2 aspirin 81 mg daily prior to admission, now on No antithrombotic 24 hours post TNK Therapy recommendations:  CIR Disposition:  Pending   Hx of Stroke/TIA 06/2015 admitted with right HP and right field cut, s/p tPA, cocaine positive. MRI negative 09/2011 admitted with R HP s/p IV tPA. MRI neg - cocaine positive, marijuana positive 03/2011 admitted with R HP and R field cut. MRI neg. cocaine positive and marijuana positive 07/2009 [facility]. MRI neg. Cocaine positive  Hypertension Home meds:  norvasc Stable Blood Pressure Goal: BP less than 180/105    Hyperlipidemia Home meds:  lipitor 80, not taking due to cost, resumed in hospital LDL 122, goal < 70 Continue statin at discharge  Chest pain Presented to ED on 3/28 and yesterday for chest pain EKG showed LVH, no STEMI Troponin level negative  Tobacco Abuse Patient smokes 0.5 packs per day for 22 years       Ready to quit? Yes Nicotine replacement therapy provided  Hx of substance Abuse  Patient uses Cocaine, Marijuana UDS negative this time for THC and Cocaine Continue abstain from substance   Other Stroke Risk Factors ETOH use, alcohol level <10, advised to drink no more than 2 drink(s) a day CAD / MI  Hospital day # 1  Patient seen and examined by NP/APP with MD. MD to update note as needed.   Elmer Picker, DNP, FNP-BC [facility] Pager: [phone]  ATTENDING NOTE: I reviewed above note and agree with the assessment and plan. Pt was seen and examined.   RN at bedside.  Patient lying bed, still complaining of right-sided numbness tingling and heaviness, as well as right visual field blurry.  On exam, AOx3, slight dysarthria, no aphasia, visual field full, subjective decreased right visual field visual acuity.  No facial droop, no gaze palsy, tongue midline.  Bilateral upper extremity with initial right arm drift however no more drift with distraction.  Right lower extremity giveaway weakness.  Left lower extremity 5/5.  Sensation subjectively decreased on the right face arm and leg.  No ataxia bilateral finger-to-nose.  Patient has history of multiple episodes of strokelike symptoms status post tPA x 2, MRI all negative but cocaine all positive.  In 06/2015 exam concerning functional component.  Today exam also concerning for functional component.  Pending MRI to rule out stroke.  Continue statin.  Continue PT OT evaluation.  For detailed assessment and plan, please refer to above as I have made changes wherever  appropriate.   Marvel Plan, MD PhD Stroke  Neurology 05/24/2023 4:54 PM  This patient is critically ill due to strokelike symptoms status post TNK and at significant risk of neurological worsening, death form bleeding from TNK. This patient's care requires constant monitoring of vital signs, hemodynamics, respiratory and cardiac monitoring, review of multiple databases, neurological assessment, discussion with family, other specialists and medical decision making of high complexity. I spent 35 minutes of neurocritical care time in the care of this patient.    To contact Stroke Continuity provider, please refer to [email or web address]. After hours, contact General Neurology

## 2023-05-24 NOTE — Evaluation (Addendum)
 Occupational Therapy Evaluation Patient Details Name: Derek French MRN: [id number] DOB: [birth year] Today's Date: 05/24/2023   History of Present Illness   58 yo male presenting 3/31 with chest pain SOB and weakness. CT negative for infarct s/p TNK. 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 Pt currently with functional limitiations due to the deficits listed below (see OT problem list). Pt at this time demonstrates biocular visual changes in peripheral field vision. Pt describes it as "blurry" then in lower right quadrant "double" then "absent vision". Pt drives fork lift as a living and concerns with new visual field changes safety to complete this occupation. Pt also demonstrates decreased coordination with R foot which is required for driving.  Pt will benefit from skilled OT to increase their independence and safety with adls and balance to allow discharge Patient will benefit from intensive inpatient follow-up therapy, >3 hours/day .      If plan is discharge home, recommend the following:   A little help with walking and/or transfers;A little help with bathing/dressing/bathroom     Functional Status Assessment   Patient has had a recent decline in their functional status and demonstrates the ability to make significant improvements in function in a reasonable and predictable amount of time.     Equipment Recommendations   None recommended by OT     Recommendations for Other Services   Rehab consult;PT consult;Speech consult     Precautions/Restrictions   Precautions Precautions: Fall Recall of Precautions/Restrictions: Intact Restrictions Weight Bearing Restrictions Per Provider Order: No     Mobility Bed Mobility Overal bed  mobility: Modified Independent                  Transfers Overall transfer level: Needs assistance Equipment used: 1 person hand held assist Transfers: Sit to/from Stand Sit to Stand: Min assist           General transfer comment: ot guarding R side as pt reports it feels like it might give      Balance Overall balance assessment: Mild deficits observed, not formally tested                                         ADL either performed or assessed with clinical judgement   ADL Overall ADL's : Needs assistance/impaired Eating/Feeding: Modified independent Eating/Feeding Details (indicate cue type and reason): pt with increased time but able to eat a hamburger observed. pt reports that right hand feels different holding foot                     Toilet Transfer: Minimal assistance           Functional mobility during ADLs: Minimal assistance General ADL Comments: pt requires cues for safety with transfer. pt visually looking down at R LE and reports it feels tingling and numb. pt with foot drop presentation with transfer but able to correct with increased hip flexion ( high stepping) when cued     Vision Baseline Vision/History: 1 Wears glasses Ability to See in Adequate Light: 1 Impaired Patient Visual Report: Diplopia;Blurring of vision;Peripheral vision impairment Vision Assessment?: Vision impaired- to be further tested in  functional context;Yes Eye Alignment: Within Functional Limits Ocular Range of Motion: Within Functional Limits Alignment/Gaze Preference: Head tilt Saccades: Impaired - to be further tested in functional context Convergence: Impaired (comment) Visual Fields: Right visual field deficit Diplopia Assessment: Only with right gaze Additional Comments: pt reports not being able to see in r peripheral vision. pt demonstrates deficits starting in 30 degrees of visual field. pt reports its blurry and then its gone. with  bioccular vision reports its "double then becomes blurry" with occlusion of R eye pt reports R visual field cut. pt appears to have bil R visual field cuts with testing. pt had no awarenes to L eye deficits until assessment. Pt completed visual scanning task of using android phone to locate sisters number and required increased time with head tilts.      Perception         Praxis         Pertinent Vitals/Pain Pain Assessment Pain Assessment: No/denies pain     Extremity/Trunk Assessment Upper Extremity Assessment Upper Extremity Assessment: Right hand dominant;RUE deficits/detail;LUE deficits/detail RUE Deficits / Details: pt with decrease grasp and coordination compared to L side RUE Sensation:  (tingling and numbness) RUE Coordination: decreased gross motor;decreased fine motor LUE Deficits / Details: WFL   Lower Extremity Assessment Lower Extremity Assessment: RLE deficits/detail RLE Deficits / Details: noted to have a foot drop with mobility but able to perform ankle flexion in sitting. decreased coordination. pt express numbness and tingling   Cervical / Trunk Assessment Cervical / Trunk Assessment: Normal   Communication Communication Communication: Impaired Factors Affecting Communication: Difficulty expressing self   Cognition Arousal: Alert Behavior During Therapy: WFL for tasks assessed/performed Cognition: No family/caregiver present to determine baseline                               Following commands: Intact       Cueing  General Comments   Cueing Techniques: Verbal cues  VSS   Exercises     Shoulder Instructions      Home Living Family/patient expects to be discharged to:: Private residence Living Arrangements: Alone Available Help at Discharge: Available PRN/intermittently Type of Home: House Home Access: Level entry     Home Layout: One level     Bathroom Shower/Tub: Chief Strategy Officer: Standard     Home  Equipment: Agricultural consultant (2 wheels);Cane - single point;Tub bench;BSC/3in1   Additional Comments: works nights at [employer]- 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[employer] 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, theatre/television/film  Lives With: Alone    Prior Functioning/Environment Prior Level of Function : Independent/Modified Independent;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, maintenance)                    OT Problem List: Decreased strength;Decreased activity tolerance;Impaired balance (sitting and/or standing);Impaired vision/perception;Decreased coordination;Decreased safety awareness;Decreased knowledge of use of DME or AE;Decreased knowledge of precautions;Impaired sensation;Impaired UE functional use   OT Treatment/Interventions: Self-care/ADL training;Therapeutic exercise;Energy conservation;Neuromuscular education;DME and/or AE instruction;Manual therapy;Modalities;Therapeutic activities;Visual/perceptual remediation/compensation;Patient/family education;Balance training      OT Goals(Current goals can be found in the care plan section)   Acute Rehab OT Goals Patient Stated Goal: to go to work and leave tomorrow OT Goal Formulation: With patient Time For Goal Achievement: 06/07/23 Potential to Achieve Goals: Good   OT Frequency:  Min 3X/week    Co-evaluation  AM-PAC OT "6 Clicks" Daily Activity     Outcome Measure Help from another person eating meals?: None Help from another person taking care of personal grooming?: A Little Help from another person toileting, which includes using toliet, bedpan, or urinal?: A Little Help from another person bathing (including washing, rinsing, drying)?: A Little Help from another person to put on and taking off regular upper body clothing?: A Little Help from another person to put on and taking off regular lower body clothing?: A Lot 6 Click Score: 18   End of Session Equipment Utilized During Treatment: Gait  belt Nurse Communication: Mobility status;Precautions  Activity Tolerance: Patient tolerated treatment well Patient left: in bed;with call bell/phone within reach;with bed alarm set  OT Visit Diagnosis: Unsteadiness on feet (R26.81);Muscle weakness (generalized) (M62.81);Low vision, both eyes (H54.2);Hemiplegia and hemiparesis Hemiplegia - Right/Left: Right Hemiplegia - dominant/non-dominant: Dominant Hemiplegia - caused by: Unspecified                Time: 6213-0865 OT Time Calculation (min): 23 min Charges:  OT General Charges $OT Visit: 1 Visit OT Evaluation $OT Eval Moderate Complexity: 1 Mod   Brynn, OTR/L  Acute Rehabilitation Services Office: [phone] .   Mateo Flow 05/24/2023, 2:37 PM

## 2023-05-24 NOTE — Plan of Care (Signed)
  Problem: Education: Goal: Knowledge of disease or condition will improve Outcome: Progressing Goal: Knowledge of secondary prevention will improve (MUST DOCUMENT ALL) Outcome: Progressing Goal: Knowledge of patient specific risk factors will improve (DELETE if not current risk factor) Outcome: Progressing   Problem: Ischemic Stroke/TIA Tissue Perfusion: Goal: Complications of ischemic stroke/TIA will be minimized Outcome: Progressing   Problem: Coping: Goal: Will verbalize positive feelings about self Outcome: Progressing Goal: Will identify appropriate support needs Outcome: Progressing   Problem: Health Behavior/Discharge Planning: Goal: Ability to manage health-related needs will improve Outcome: Progressing Goal: Goals will be collaboratively established with patient/family Outcome: Progressing   Problem: Self-Care: Goal: Ability to participate in self-care as condition permits will improve Outcome: Progressing Goal: Verbalization of feelings and concerns over difficulty with self-care will improve Outcome: Progressing Goal: Ability to communicate needs accurately will improve Outcome: Progressing   Problem: Nutrition: Goal: Risk of aspiration will decrease Outcome: Progressing Goal: Dietary intake will improve Outcome: Progressing   Problem: Health Behavior/Discharge Planning: Goal: Ability to manage health-related needs will improve Outcome: Progressing   Problem: Education: Goal: Knowledge of General Education information will improve Description: Including pain rating scale, medication(s)/side effects and non-pharmacologic comfort measures Outcome: Progressing   Problem: Clinical Measurements: Goal: Ability to maintain clinical measurements within normal limits will improve Outcome: Progressing Goal: Will remain free from infection Outcome: Progressing Goal: Diagnostic test results will improve Outcome: Progressing Goal: Respiratory complications will  improve Outcome: Progressing Goal: Cardiovascular complication will be avoided Outcome: Progressing   Problem: Elimination: Goal: Will not experience complications related to bowel motility Outcome: Progressing Goal: Will not experience complications related to urinary retention Outcome: Progressing   Problem: Coping: Goal: Level of anxiety will decrease Outcome: Progressing   Problem: Nutrition: Goal: Adequate nutrition will be maintained Outcome: Progressing   Problem: Safety: Goal: Ability to remain free from injury will improve Outcome: Progressing   Problem: Skin Integrity: Goal: Risk for impaired skin integrity will decrease Outcome: Progressing

## 2023-05-24 NOTE — Plan of Care (Signed)
  Problem: Education: Goal: Knowledge of disease or condition will improve Outcome: Progressing Goal: Knowledge of secondary prevention will improve (MUST DOCUMENT ALL) Outcome: Progressing Goal: Knowledge of patient specific risk factors will improve (DELETE if not current risk factor) Outcome: Progressing   Problem: Ischemic Stroke/TIA Tissue Perfusion: Goal: Complications of ischemic stroke/TIA will be minimized Outcome: Progressing   Problem: Coping: Goal: Will verbalize positive feelings about self Outcome: Progressing Goal: Will identify appropriate support needs Outcome: Progressing   Problem: Health Behavior/Discharge Planning: Goal: Ability to manage health-related needs will improve Outcome: Progressing Goal: Goals will be collaboratively established with patient/family Outcome: Progressing   Problem: Self-Care: Goal: Ability to participate in self-care as condition permits will improve Outcome: Progressing Goal: Verbalization of feelings and concerns over difficulty with self-care will improve Outcome: Progressing Goal: Ability to communicate needs accurately will improve Outcome: Progressing   Problem: Nutrition: Goal: Risk of aspiration will decrease Outcome: Progressing Goal: Dietary intake will improve Outcome: Progressing   Problem: Education: Goal: Knowledge of General Education information will improve Description: Including pain rating scale, medication(s)/side effects and non-pharmacologic comfort measures Outcome: Progressing   Problem: Health Behavior/Discharge Planning: Goal: Ability to manage health-related needs will improve Outcome: Progressing   Problem: Clinical Measurements: Goal: Ability to maintain clinical measurements within normal limits will improve Outcome: Progressing Goal: Will remain free from infection Outcome: Progressing Goal: Diagnostic test results will improve Outcome: Progressing Goal: Respiratory complications will  improve Outcome: Progressing Goal: Cardiovascular complication will be avoided Outcome: Progressing   Problem: Nutrition: Goal: Adequate nutrition will be maintained Outcome: Progressing   Problem: Coping: Goal: Level of anxiety will decrease Outcome: Progressing   Problem: Elimination: Goal: Will not experience complications related to bowel motility Outcome: Progressing Goal: Will not experience complications related to urinary retention Outcome: Progressing   Problem: Pain Managment: Goal: General experience of comfort will improve and/or be controlled Outcome: Progressing   Problem: Safety: Goal: Ability to remain free from injury will improve Outcome: Progressing   Problem: Skin Integrity: Goal: Risk for impaired skin integrity will decrease Outcome: Progressing   Problem: Activity: Goal: Risk for activity intolerance will decrease Outcome: Not Progressing

## 2023-05-24 NOTE — Evaluation (Signed)
 Speech Language Pathology Evaluation Patient Details Name: Derek French MRN: [id number] DOB: [birth year] Today's Date: 05/24/2023 Time: 2130-8657 SLP Time Calculation (min) (ACUTE ONLY): 13 min  Problem List:  Patient Active Problem List   Diagnosis Date Noted   Stroke (HCC) 05/23/2023   Chest pain on breathing 08/31/2022   Pleuritic chest pain 08/30/2022   Tobacco abuse 08/30/2022   Encounter for hepatitis C screening test for low risk patient 01/25/2022   Weight loss 01/25/2022   Duodenal ulcer 01/19/2022   Right lower quadrant abdominal pain 01/18/2022   Lower GI bleed 01/18/2022   Acute GI bleeding 01/16/2022   Anemia due to GI blood loss 01/16/2022   Subungual hematoma of digit of hand 05/16/2019   HTN (hypertension) 05/03/2019   Cocaine use    Stroke (cerebrum) (HCC) 07/01/2015   Cerebral ischemia 09/26/2011   Cerebral infarction (HCC) 03/30/2011   Dyslipidemia 03/30/2011   Substance abuse (HCC) 03/30/2011   Cigarette smoker 03/30/2011   Past Medical History:  Past Medical History:  Diagnosis Date   Anemia due to GI blood loss 01/16/2022   Chest pain 08/30/2022   Encounter for hepatitis C screening test for low risk patient 01/25/2022   Hypertension 05/03/2019   Recurrent boils    Stroke-like episode    07/2009, 03/2011, 09/2011   Past Surgical History:  Past Surgical History:  Procedure Laterality Date   BIOPSY  01/19/2022   Procedure: BIOPSY;  Surgeon: Jenel Lucks, MD;  Location: [facility] ENDOSCOPY;  Service: Gastroenterology;;   COLONOSCOPY WITH PROPOFOL N/A 01/18/2022   Procedure: COLONOSCOPY WITH PROPOFOL;  Surgeon: Jenel Lucks, MD;  Location: [facility] ENDOSCOPY;  Service: Gastroenterology;  Laterality: N/A;   ESOPHAGOGASTRODUODENOSCOPY N/A 01/19/2022   Procedure: ESOPHAGOGASTRODUODENOSCOPY (EGD);  Surgeon: Jenel Lucks, MD;  Location: [facility] ENDOSCOPY;  Service: Gastroenterology;  Laterality: N/A;   HERNIA REPAIR     HPI:  Derek French is a 58 yo  male presenting to ED 3/31 initially with chest pain, shortness of breath, and weakness. ED staff then noted aphasia, R sided weakness, and dysarthria. S/p TNK. CTA/CTH negative for acute infarct. MRI pending. Seen by SLP in 2017 with mild expressive language deficits and attention deficits. 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 He lives alone and is typically independent with all ADLs. Pt states he feels as if his speech sounds slurred, although he is 100% intelligible at the conversation level and has no focal facial weakness. He scored 27/30 on the SLUMS, which is considered WFL. No further SLP f/u is indicated at this time, will sign off.    SLP Assessment  SLP Recommendation/Assessment: Patient does not need any further Speech Lanaguage Pathology Services SLP Visit Diagnosis: Cognitive communication deficit (R41.841)    Recommendations for follow up therapy are one component of a multi-disciplinary discharge planning process, led by the attending physician.  Recommendations may be updated based on patient status, additional functional criteria and insurance authorization.    Follow Up Recommendations  No SLP follow up    Assistance Recommended at Discharge  PRN  Functional Status Assessment Patient has not had a recent decline in their functional status  Frequency and Duration  SLP Evaluation Cognition  Overall Cognitive Status: Within Functional Limits for tasks assessed Orientation Level: Oriented X4       Comprehension  Auditory Comprehension Overall Auditory Comprehension: Appears within functional limits for tasks assessed    Expression Expression Primary Mode of  Expression: Verbal Verbal Expression Overall Verbal Expression: Appears within functional limits for tasks assessed   Oral / Motor  Oral Motor/Sensory Function Overall Oral Motor/Sensory Function: Within functional limits Motor Speech Overall Motor Speech: Appears within functional limits for tasks assessed            Gwynneth Aliment, M.A., CF-SLP Speech Language Pathology, Acute Rehabilitation Services  Secure Chat preferred [phone]  05/24/2023, 10:50 AM

## 2023-05-25 DIAGNOSIS — R200 Anesthesia of skin: Secondary | ICD-10-CM | POA: Diagnosis not present

## 2023-05-25 DIAGNOSIS — R4701 Aphasia: Secondary | ICD-10-CM | POA: Diagnosis not present

## 2023-05-25 DIAGNOSIS — H538 Other visual disturbances: Secondary | ICD-10-CM | POA: Diagnosis not present

## 2023-05-25 DIAGNOSIS — R519 Headache, unspecified: Secondary | ICD-10-CM

## 2023-05-25 DIAGNOSIS — R531 Weakness: Secondary | ICD-10-CM | POA: Diagnosis not present

## 2023-05-25 LAB — BASIC METABOLIC PANEL WITH GFR
Anion gap: 7 (ref 5–15)
BUN: 18 mg/dL (ref 6–20)
CO2: 21 mmol/L — ABNORMAL LOW (ref 22–32)
Calcium: 8.7 mg/dL — ABNORMAL LOW (ref 8.9–10.3)
Chloride: 107 mmol/L (ref 98–111)
Creatinine, Ser: 0.93 mg/dL (ref 0.61–1.24)
GFR, Estimated: >60 mL/min (ref >60)
Glucose, Bld: 103 mg/dL — ABNORMAL HIGH (ref 70–99)
Potassium: 4.1 mmol/L (ref 3.5–5.1)
Sodium: 135 mmol/L (ref 135–145)

## 2023-05-25 LAB — CBC
HCT: 43.5 % (ref 39.0–52.0)
Hemoglobin: 14.7 g/dL (ref 13.0–17.0)
MCH: 31.3 pg (ref 26.0–34.0)
MCHC: 33.8 g/dL (ref 30.0–36.0)
MCV: 92.8 fL (ref 80.0–100.0)
Platelets: 229 10*3/uL (ref 150–400)
RBC: 4.69 MIL/uL (ref 4.22–5.81)
RDW: 14 % (ref 11.5–15.5)
WBC: 5.7 10*3/uL (ref 4.0–10.5)
nRBC: 0 % (ref 0.0–0.2)

## 2023-05-25 MED ORDER — BUTALBITAL-APAP-CAFFEINE 50-325-40 MG PO TABS: 2.0000 | ORAL_TABLET | Freq: Four times a day (QID) | ORAL | Status: DC | PRN

## 2023-05-25 MED ORDER — ENSURE ENLIVE PO LIQD
237.0000 mL | Freq: Two times a day (BID) | ORAL | Status: DC
  Administered 2023-05-25 – 2023-05-26 (×2): 237 mL via ORAL

## 2023-05-25 MED ORDER — ALPRAZOLAM 0.25 MG PO TABS
0.5000 mg | ORAL_TABLET | Freq: Two times a day (BID) | ORAL | Status: DC | PRN
  Administered 2023-05-25 – 2023-05-26 (×2): 0.5 mg via ORAL
  Filled 2023-05-25 (×2): qty 2

## 2023-05-25 MED ORDER — BUTALBITAL-APAP-CAFFEINE 50-325-40 MG PO TABS
2.0000 | ORAL_TABLET | Freq: Four times a day (QID) | ORAL | Status: DC | PRN
  Administered 2023-05-25 – 2023-05-26 (×4): 2 via ORAL
  Filled 2023-05-25 (×5): qty 2

## 2023-05-25 NOTE — Plan of Care (Signed)
   Problem: Coping: Goal: Will verbalize positive feelings about self Outcome: Progressing

## 2023-05-25 NOTE — Progress Notes (Addendum)
 STROKE TEAM PROGRESS NOTE   INTERIM HISTORY/SUBJECTIVE Seen in room, no family at the bedside.  MRI negative for any acute infarct.  He has been working with physical therapy and Occupational Therapy.  They were initially recommending acute inpatient rehab however he will likely progressed to just requiring outpatient therapy.  He is additionally reporting a headache.  Fioricet has been ordered.  OBJECTIVE  CBC    Component Value Date/Time   WBC 5.7 05/25/2023 0329   RBC 4.69 05/25/2023 0329   HGB 14.7 05/25/2023 0329   HGB 12.1 (L) 01/25/2022 1537   HCT 43.5 05/25/2023 0329   HCT 34.9 (L) 01/25/2022 1537   PLT 229 05/25/2023 0329   PLT 329 01/25/2022 1537   MCV 92.8 05/25/2023 0329   MCV 94 01/25/2022 1537   MCH 31.3 05/25/2023 0329   MCHC 33.8 05/25/2023 0329   RDW 14.0 05/25/2023 0329   RDW 12.8 01/25/2022 1537   LYMPHSABS 2.4 05/23/2023 1746   MONOABS 0.6 05/23/2023 1746   EOSABS 0.1 05/23/2023 1746   BASOSABS 0.0 05/23/2023 1746    BMET    Component Value Date/Time   NA 135 05/25/2023 0329   NA 141 05/02/2019 1531   K 4.1 05/25/2023 0329   CL 107 05/25/2023 0329   CO2 21 (L) 05/25/2023 0329   GLUCOSE 103 (H) 05/25/2023 0329   BUN 18 05/25/2023 0329   BUN 18 05/02/2019 1531   CREATININE 0.93 05/25/2023 0329   CALCIUM 8.7 (L) 05/25/2023 0329   GFRNONAA >60 05/25/2023 0329    IMAGING past 24 hours MR BRAIN WO CONTRAST Result Date: 05/24/2023 CLINICAL DATA:  Stroke follow-up EXAM: MRI HEAD WITHOUT CONTRAST TECHNIQUE: Multiplanar, multiecho pulse sequences of the brain and surrounding structures were obtained without intravenous contrast. COMPARISON:  07/02/2015 and 05/23/2023 FINDINGS: Brain: No acute infarct, mass effect or extra-axial collection. No acute or chronic hemorrhage. There is multifocal hyperintense T2-weighted signal within the white matter. Parenchymal volume and CSF spaces are normal. The midline structures are normal. Vascular: Normal flow voids. Skull  and upper cervical spine: Normal calvarium and skull base. Visualized upper cervical spine and soft tissues are normal. Sinuses/Orbits:No paranasal sinus fluid levels or advanced mucosal thickening. No mastoid or middle ear effusion. Normal orbits. IMPRESSION: 1. No acute intracranial abnormality. 2. Findings of chronic small vessel ischemia. Electronically Signed   By: Deatra Robinson M.D.   On: 05/24/2023 20:01    Vitals:   05/25/23 0800 05/25/23 0900 05/25/23 1000 05/25/23 1100  BP: 135/87 (!) 150/85 (!) 144/85   Pulse: (!) 56 60 (!) 58 63  Resp: 17 18 15  (!) 22  Temp: 98.2 F (36.8 C)     TempSrc: Oral     SpO2: 91% (!) 85% 92% 93%  Weight:      Height:         PHYSICAL EXAM General:  Alert, well-nourished, well-developed patient in no acute distress Psych:  Mood and affect appropriate for situation CV: Regular rate and rhythm on monitor Respiratory:  Regular, unlabored respirations on room air GI: Abdomen soft and nontender   NEURO:  Mental Status: AA&Ox3, patient is able to give clear and coherent history Speech/Language: Naming, repetition, fluency, and comprehension intact. Mild dysarthria with hesitancy of speech Cranial Nerves:  II: PERRL. Endorses blurry vision on the right, but able to still count fingers III, IV, VI: EOMI. Eyelids elevate symmetrically.  V: Sensation is intact to light touch and symmetrical to face.  VII: Face is symmetrical resting and  smiling VIII: hearing intact to voice. IX, X: Palate elevates symmetrically. Phonation is normal.  ZO:[id number] shrug 5/5. XII: tongue is midline without fasciculations. Motor: 5/5 strength to all muscle groups tested.   Tone: is normal and bulk is normal Sensation-  endorses tingling and numbness sensation on the right upper and lower. Still feels pressure Coordination: FTN intact bilaterally, HKS: no ataxia in BLE.No drift.  Gait- deferred  Most Recent NIH 2     ASSESSMENT/PLAN  Mr. DEREKE NEUMANN is a 58  y.o. 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  NIH on Admission 10  Possible functional neurologic disorder, but TIA on DDx Presented with right HP, HH, aphasia s/p TNK Pt presented to ED for chest pain and SOB, while in ED, he became hot all over and then felt right sided numbness tingling and then weakness. Still complaining of right sided decreased sensation and strength today but on exam some functional component Code Stroke CT head No acute abnormality. Small vessel disease. ASPECTS 10.    CTA head & neck No large vessel occlusion. No high-grade stenosis of the arteries in the head and neck. MRI no acute infarct 2D Echo EF 60-65% LDL 122 HgbA1c 5.3 VTE prophylaxis - SCDs Taking 2 aspirin 81 mg daily prior to admission, no aspirin 81.  Continue on discharge Therapy recommendations:  outpatient therapy Disposition:  Pending   Hx of Stroke/TIA 06/2015 admitted with right HP and right field cut, s/p tPA, cocaine positive. MRI negative 09/2011 admitted with R HP s/p IV tPA. MRI neg - cocaine positive, marijuana positive 03/2011 admitted with R HP and R field cut. MRI neg. cocaine positive and marijuana positive 07/2009 treated for stroke at [facility]. MRI neg. Cocaine positive  Hypertension Home meds:  norvasc Stable Long-term BP goal normotensive  Hyperlipidemia Home meds:  lipitor 80, not taking due to cost  LDL 122, goal < 70 On Lipitor 80 Case manager consulted for cost Continue statin at discharge  Chest pain, resolved Presented to ED on 3/28 and yesterday for chest pain EKG showed LVH, no STEMI Troponin level negative  Tobacco Abuse Patient smokes 0.5 packs per day for 22 years       Ready to quit? Yes Nicotine replacement therapy provided  Hx of substance Abuse  Patient uses  Cocaine, Marijuana UDS negative this time for THC and Cocaine Continue abstain from substance   Other Stroke Risk Factors ETOH use, alcohol level <10, advised to drink no more than 2 drink(s) a day CAD / MI  Hospital day # 2  Patient seen and examined by NP/APP with MD. MD to update note as needed.   Elmer Picker, DNP, FNP-BC [facility] Pager: [phone]  ATTENDING NOTE: I reviewed above note and agree with the assessment and plan. Pt was seen and examined.   No family at bedside.  Patient still stated he has right face arm and leg decree sensation, although better than yesterday but still present.  Muscle strength testing seems symmetrical.  MRI no acute infarct.  Continue aspirin and Lipitor.  PT and OT recommend outpatient.  For detailed assessment and plan, please refer to above as I have made changes wherever appropriate.   Marvel Plan, MD PhD Stroke Neurology 05/25/2023 5:39 PM  This patient is critically ill due to strokelike symptoms status post TNK  and at significant risk of neurological worsening, death form bleeding from TNK. This patient's care requires constant monitoring of vital signs, hemodynamics, respiratory and cardiac monitoring, review of multiple databases, neurological assessment, discussion with family, other specialists and medical decision making of high complexity. I spent 35 minutes of neurocritical care time in the care of this patient.    To contact Stroke Continuity provider, please refer to [email or web address]. After hours, contact General Neurology

## 2023-05-25 NOTE — Progress Notes (Addendum)
 Physical Therapy Treatment Patient Details Name: Derek French MRN: [id number] DOB: 17-Nov-1965 Today's Date: 05/25/2023   History of Present Illness 58 yo male presenting 3/31 with chest pain SOB and weakness. CT negative for infarct s/p TNK. Pending MRI PMH stroke due to vasospasm with cocaine abuse, CKD, cancer, dementia, CAD, CHF, MI, COPD, T2DM, hepatitis, fibromyalgia, sickle cell, multiple myeloma    PT Comments  Pt progressing steadily towards his physical therapy goals. Reporting headache, but agreeable and motivated to participate in therapy. Pt reports continued decreased sensation in RLE. Session focused on gait training. Worked on targeted stepping with R foot with visual and tactile cueing for heel strike, foot clearance, and step length. Pt with fair carryover into progressive ambulation, walking 200 ft with no assistive device vs L railing. Able to vary gait speed more and demonstrates improved stability with single UE support, so recommend [facility] for d/c and OPPT to address deficits.     If plan is discharge home, recommend the following: A little help with walking and/or transfers;A little help with bathing/dressing/bathroom;Assistance with cooking/housework;Assist for transportation;Help with stairs or ramp for entrance   Can travel by private vehicle        Equipment Recommendations  Cane    Recommendations for Other Services       Precautions / Restrictions Precautions Precautions: Fall Recall of Precautions/Restrictions: Intact Restrictions Weight Bearing Restrictions Per Provider Order: No     Mobility  Bed Mobility Overal bed mobility: Modified Independent                  Transfers Overall transfer level: Modified independent                      Ambulation/Gait Ambulation/Gait assistance: Min assist, Contact guard assist Gait Distance (Feet): 200 Feet Assistive device: None Gait Pattern/deviations: Step-to pattern, Decreased dorsiflexion  - right, Decreased weight shift to right, Steppage Gait velocity: decreased     General Gait Details: Step to pattern, steppage gait on R with increased hip hiking to clear foot, initially with decreased R heel strike, but able to correct with verbal/visual cueing. CGA to light minA for dynamic stability. Benefits from use of L rail for external support.   Stairs             Wheelchair Mobility     Tilt Bed    Modified Rankin (Stroke Patients Only) Modified Rankin (Stroke Patients Only) Pre-Morbid Rankin Score: No symptoms Modified Rankin: Moderately severe disability     Balance Overall balance assessment: Mild deficits observed, not formally tested                                          Communication Communication Communication: No apparent difficulties  Cognition Arousal: Alert Behavior During Therapy: WFL for tasks assessed/performed   PT - Cognitive impairments: No apparent impairments                         Following commands: Intact      Cueing Cueing Techniques: Verbal cues  Exercises Other Exercises Other Exercises: Targeted stepping with R foot with visual/tactile cueing for knee flexion in swing phase, heel strike, and adequate foot clearance    General Comments        Pertinent Vitals/Pain Pain Assessment Pain Assessment: Faces Faces Pain Scale: Hurts little more Pain Location:  headache Pain Descriptors / Indicators: Headache Pain Intervention(s): Monitored during session    Home Living                          Prior Function            PT Goals (current goals can now be found in the care plan section) Acute Rehab PT Goals Patient Stated Goal: to return to baseline PT Goal Formulation: With patient Time For Goal Achievement: 06/07/23 Potential to Achieve Goals: Good Progress towards PT goals: Progressing toward goals    Frequency    Min 3X/week      PT Plan      Co-evaluation               AM-PAC PT "6 Clicks" Mobility   Outcome Measure  Help needed turning from your back to your side while in a flat bed without using bedrails?: None Help needed moving from lying on your back to sitting on the side of a flat bed without using bedrails?: None Help needed moving to and from a bed to a chair (including a wheelchair)?: A Little Help needed standing up from a chair using your arms (e.g., wheelchair or bedside chair)?: A Little Help needed to walk in hospital room?: A Little Help needed climbing 3-5 steps with a railing? : Total 6 Click Score: 18    End of Session Equipment Utilized During Treatment: Gait belt Activity Tolerance: Patient tolerated treatment well Patient left: in bed;with bed alarm set;with call bell/phone within reach Nurse Communication: Mobility status PT Visit Diagnosis: Unsteadiness on feet (R26.81);Other abnormalities of gait and mobility (R26.89);Difficulty in walking, not elsewhere classified (R26.2)     Time: 7829-5621 PT Time Calculation (min) (ACUTE ONLY): 27 min  Charges:    $Gait Training: 23-37 mins PT General Charges $$ ACUTE PT VISIT: 1 Visit                     Lillia Pauls, PT, DPT Acute Rehabilitation Services Office [phone]    Norval Morton 05/25/2023, 1:10 PM

## 2023-05-25 NOTE — Progress Notes (Signed)
 Inpatient Rehab Admissions Coordinator:   Per therapy recommendations, patient was screened for CIR candidacy by Megan Salon, MS, CCC-SLP. At this time, Pt. does not appear to demonstrate medical necessity to justify in hospital rehabilitation/CIR. I  will not pursue a rehab consult for this Pt.   Recommend other rehab venues to be pursued.  Please contact me with any questions.  Megan Salon, MS, CCC-SLP Rehab Admissions Coordinator  [phone] (celll) [phone] (office)

## 2023-05-26 ENCOUNTER — Encounter: Payer: Self-pay | Admitting: Nurse Practitioner

## 2023-05-26 ENCOUNTER — Other Ambulatory Visit (HOSPITAL_COMMUNITY): Payer: Self-pay

## 2023-05-26 ENCOUNTER — Other Ambulatory Visit: Payer: Self-pay | Admitting: Neurology

## 2023-05-26 DIAGNOSIS — R2990 Unspecified symptoms and signs involving the nervous system: Secondary | ICD-10-CM

## 2023-05-26 DIAGNOSIS — H538 Other visual disturbances: Secondary | ICD-10-CM | POA: Diagnosis not present

## 2023-05-26 DIAGNOSIS — R531 Weakness: Secondary | ICD-10-CM | POA: Diagnosis not present

## 2023-05-26 DIAGNOSIS — R4701 Aphasia: Secondary | ICD-10-CM | POA: Diagnosis not present

## 2023-05-26 LAB — CBC
HCT: 44.2 % (ref 39.0–52.0)
Hemoglobin: 15.3 g/dL (ref 13.0–17.0)
MCH: 31.7 pg (ref 26.0–34.0)
MCHC: 34.6 g/dL (ref 30.0–36.0)
MCV: 91.7 fL (ref 80.0–100.0)
Platelets: 211 10*3/uL (ref 150–400)
RBC: 4.82 MIL/uL (ref 4.22–5.81)
RDW: 13.9 % (ref 11.5–15.5)
WBC: 9.5 10*3/uL (ref 4.0–10.5)
nRBC: 0 % (ref 0.0–0.2)

## 2023-05-26 LAB — BASIC METABOLIC PANEL WITH GFR
Anion gap: 10 (ref 5–15)
BUN: 11 mg/dL (ref 6–20)
CO2: 22 mmol/L (ref 22–32)
Calcium: 8.8 mg/dL — ABNORMAL LOW (ref 8.9–10.3)
Chloride: 105 mmol/L (ref 98–111)
Creatinine, Ser: 0.95 mg/dL (ref 0.61–1.24)
GFR, Estimated: >60 mL/min (ref >60)
Glucose, Bld: 96 mg/dL (ref 70–99)
Potassium: 4 mmol/L (ref 3.5–5.1)
Sodium: 137 mmol/L (ref 135–145)

## 2023-05-26 MED ORDER — VALPROATE SODIUM 100 MG/ML IV SOLN
500.0000 mg | Freq: Once | INTRAVENOUS | Status: AC
  Administered 2023-05-26: 500 mg via INTRAVENOUS
  Filled 2023-05-26: qty 5

## 2023-05-26 MED ORDER — KETOROLAC TROMETHAMINE 30 MG/ML IJ SOLN
30.0000 mg | Freq: Once | INTRAMUSCULAR | Status: AC
  Administered 2023-05-26: 30 mg via INTRAVENOUS
  Filled 2023-05-26: qty 1

## 2023-05-26 MED ORDER — NICOTINE 21 MG/24HR TD PT24
21.0000 mg | MEDICATED_PATCH | Freq: Every day | TRANSDERMAL | 0 refills | Status: DC
  Filled 2023-05-26: qty 28, 28d supply, fill #0

## 2023-05-26 MED ORDER — PROCHLORPERAZINE EDISYLATE 10 MG/2ML IJ SOLN
10.0000 mg | Freq: Once | INTRAMUSCULAR | Status: AC
  Administered 2023-05-26: 10 mg via INTRAVENOUS
  Filled 2023-05-26: qty 2

## 2023-05-26 MED ORDER — ASPIRIN 81 MG PO TBEC
81.0000 mg | DELAYED_RELEASE_TABLET | Freq: Every day | ORAL | 12 refills | Status: AC
  Filled 2023-05-26: qty 30, 30d supply, fill #0

## 2023-05-26 MED ORDER — BUTALBITAL-APAP-CAFFEINE 50-325-40 MG PO TABS
2.0000 | ORAL_TABLET | Freq: Three times a day (TID) | ORAL | 0 refills | Status: DC | PRN
  Filled 2023-05-26: qty 14, 3d supply, fill #0

## 2023-05-26 MED ORDER — PANTOPRAZOLE SODIUM 40 MG PO TBEC
40.0000 mg | DELAYED_RELEASE_TABLET | Freq: Every day | ORAL | 1 refills | Status: DC
  Filled 2023-05-26: qty 30, 30d supply, fill #0

## 2023-05-26 NOTE — Progress Notes (Signed)
 Physical Therapy Treatment Patient Details Name: Derek French MRN: [id number] DOB: 07-11-1965 Today's Date: 05/26/2023   History of Present Illness 58 yo male presenting 3/31 with chest pain SOB and weakness. CT negative for infarct s/p TNK. MRI negative for acute abnormality. PMH stroke due to vasospasm with cocaine abuse, CKD, cancer, dementia, CAD, CHF, MI, COPD, T2DM, hepatitis, fibromyalgia, sickle cell, multiple myeloma    PT Comments  Pt is progressing with goals. Mod I for bed mobility and sit to stand with SPC this session. Pt attempted gait with SPC requiring Min A with step to gait pattern and steppage gait pattern noted. Pt was provided with RW with significant improvement in mobility requiring Mod I. Due to pt current functional status, home set up and available assistance at home recommending skilled physical therapy services 3x/week in order to address strength, balance and functional mobility to decrease risk for falls, injury and re-hospitalization.       If plan is discharge home, recommend the following: A little help with walking and/or transfers;Assistance with cooking/housework;Assist for transportation;Help with stairs or ramp for entrance     Equipment Recommendations  Rolling walker (2 wheels)       Precautions / Restrictions Precautions Precautions: Fall Recall of Precautions/Restrictions: Intact Restrictions Weight Bearing Restrictions Per Provider Order: No     Mobility  Bed Mobility Overal bed mobility: Modified Independent             General bed mobility comments: needs extra time and use of bed rails.    Transfers Overall transfer level: Modified independent Equipment used: Straight cane Transfers: Sit to/from Stand Sit to Stand: Modified independent (Device/Increase time)                Ambulation/Gait Ambulation/Gait assistance: Min assist, Modified independent (Device/Increase time) Gait Distance (Feet): 200 Feet Assistive device:  Straight cane, Rolling walker (2 wheels) Gait Pattern/deviations: Step-to pattern, Decreased dorsiflexion - right, Decreased weight shift to right, Steppage, Step-through pattern Gait velocity: decreased Gait velocity interpretation: 1.31 - 2.62 ft/sec, indicative of limited community ambulator   General Gait Details: With [employer] pt had step to gait pattern with signifciant steppage gait on the R with hip hiking to clear foot. With RW pt was mod I with improve gait pattern utilizing step through gait pattern and decreased steppage gait for more normal appearing gait pattern.   Modified Rankin (Stroke Patients Only) Modified Rankin (Stroke Patients Only) Pre-Morbid Rankin Score: No symptoms Modified Rankin: Slight disability     Balance Overall balance assessment: Mild deficits observed, not formally tested      Communication Communication Communication: No apparent difficulties  Cognition Arousal: Alert Behavior During Therapy: WFL for tasks assessed/performed   PT - Cognitive impairments: No apparent impairments     Following commands: Intact      Cueing Cueing Techniques: Verbal cues     General Comments General comments (skin integrity, edema, etc.): No signs/symptoms of respiratory/cardiac distress during session.      Pertinent Vitals/Pain Pain Assessment Pain Assessment: 0-10 Pain Score: 4  Pain Location: headache Pain Descriptors / Indicators: Headache Pain Intervention(s): Monitored during session, Premedicated before session     PT Goals (current goals can now be found in the care plan section) Acute Rehab PT Goals Patient Stated Goal: to return to baseline PT Goal Formulation: With patient Time For Goal Achievement: 06/07/23 Potential to Achieve Goals: Good    Frequency    Min 3X/week      PT Plan  Continue  with current POC        AM-PAC PT "6 Clicks" Mobility   Outcome Measure  Help needed turning from your back to your side while in a flat  bed without using bedrails?: None Help needed moving from lying on your back to sitting on the side of a flat bed without using bedrails?: None Help needed moving to and from a bed to a chair (including a wheelchair)?: None Help needed standing up from a chair using your arms (e.g., wheelchair or bedside chair)?: None Help needed to walk in hospital room?: A Little Help needed climbing 3-5 steps with a railing? : A Lot 6 Click Score: 21    End of Session Equipment Utilized During Treatment: Gait belt Activity Tolerance: Patient tolerated treatment well Patient left: in bed;with bed alarm set;with call bell/phone within reach Nurse Communication: Mobility status PT Visit Diagnosis: Unsteadiness on feet (R26.81);Other abnormalities of gait and mobility (R26.89);Difficulty in walking, not elsewhere classified (R26.2)     Time: 1610-9604 PT Time Calculation (min) (ACUTE ONLY): 19 min  Charges:    $Gait Training: 8-22 mins PT General Charges $$ ACUTE PT VISIT: 1 Visit                     Harrel Carina, DPT, CLT  Acute Rehabilitation Services Office: [phone] (Secure chat preferred)    Claudia Desanctis 05/26/2023, 4:33 PM

## 2023-05-26 NOTE — Discharge Instructions (Signed)
 Mr. Daubert, you came to the Emergency department with chest discomfort and began to feel right sided numbness and tingling.  You were given TNK to treat a possible stroke, but your MRI showed no stroke.  You are now ready to be discharged with outpatient physical and occupational therapy.  Your symptoms of right sided numbness were possibly caused by a TIA, and it is also possible that stress has made these symptoms worse.  Please take 81 mg of aspirin daily and follow up in the stroke clinic in two months.

## 2023-05-26 NOTE — TOC Initial Note (Addendum)
 Transition of Care [facility]) - Initial/Assessment Note    Patient Details  Name: Derek French MRN: [id number] Date of Birth: August 17, 1965  Transition of Care [facility]) CM/SW Contact:    Kermit Balo, RN Phone Number: 05/26/2023, 11:54 AM  Clinical Narrative:                  Pt is from home alone. Pt still works. He uses bus or lyft for transportation. He manages his own medications but per pharmacy has not been taking his meds. Pt did state his lipitor is expensive through his insurance. CM to provide him a Good Rx card to see if this helps. CM changed his d/c pharmacy to [facility] pharmacy to see the cost at d/c.  Cane ordered through [employer]. They will deliver the DME to his room.  Pt will need transportation home.   1234: PT changing rec to walker. CM has updated Lorelle Gibbs with [facility]. This DME will be delivered to the room.  Expected Discharge Plan: OP Rehab Barriers to Discharge: Continued Medical Work up   Patient Goals and CMS Choice     Choice offered to / list presented to : Patient      Expected Discharge Plan and Services   Discharge Planning Services: CM Consult Post Acute Care Choice: Durable Medical Equipment Living arrangements for the past 2 months: Single Family Home                 DME Arranged: [place] DME Agency: [facility] Date DME Agency Contacted: 05/26/23   Representative spoke with at DME Agency: Lorelle Gibbs            Prior Living Arrangements/Services Living arrangements for the past 2 months: Single Family Home Lives with:: Self Patient language and need for interpreter reviewed:: Yes Do you feel safe going back to the place where you live?: Yes        Care giver support system in place?: No (comment) Current home services: DME (shower seat) Criminal Activity/Legal Involvement Pertinent to Current Situation/Hospitalization: No - Comment as needed  Activities of Daily Living   ADL Screening (condition at time of admission) Independently performs  ADLs?: Yes (appropriate for developmental age) Is the patient deaf or have difficulty hearing?: No Does the patient have difficulty seeing, even when wearing glasses/contacts?: Yes Does the patient have difficulty concentrating, remembering, or making decisions?: No  Permission Sought/Granted                  Emotional Assessment Appearance:: Appears stated age Attitude/Demeanor/Rapport: Engaged Affect (typically observed): Accepting Orientation: : Oriented to Self, Oriented to Place, Oriented to  Time, Oriented to Situation   Psych Involvement: No (comment)  Admission diagnosis:  Stroke [facility]) [I63.9] Patient Active Problem List   Diagnosis Date Noted   Stroke (HCC) 05/23/2023   Chest pain on breathing 08/31/2022   Pleuritic chest pain 08/30/2022   Tobacco abuse 08/30/2022   Encounter for hepatitis C screening test for low risk patient 01/25/2022   Weight loss 01/25/2022   Duodenal ulcer 01/19/2022   Right lower quadrant abdominal pain 01/18/2022   Lower GI bleed 01/18/2022   Acute GI bleeding 01/16/2022   Anemia due to GI blood loss 01/16/2022   Subungual hematoma of digit of hand 05/16/2019   HTN (hypertension) 05/03/2019   Cocaine use    Stroke (cerebrum) (HCC) 07/01/2015   Cerebral ischemia 09/26/2011   Cerebral infarction (HCC) 03/30/2011   Dyslipidemia 03/30/2011   Substance abuse (HCC) 03/30/2011  Cigarette smoker 03/30/2011   PCP:  Elberta Fortis, MD Pharmacy:   [facility] [phone] Ginette Otto, [place] - [address] AT [facility] OF [address] & [facility] [address] [place] [address] Phone: [phone] Fax: [phone]  [facility] [address] [place] [address] Phone: [phone] Fax: [phone]     Social Drivers of Health (SDOH) Social History: SDOH Screenings   Food Insecurity: No Food Insecurity (05/24/2023)  Housing: Low Risk  (05/24/2023)  Transportation Needs: No Transportation Needs  (05/24/2023)  Utilities: Not At Risk (05/24/2023)  Depression (PHQ2-9): Low Risk  (01/25/2022)  Tobacco Use: High Risk (05/23/2023)   SDOH Interventions:     Readmission Risk Interventions     No data to display

## 2023-05-26 NOTE — Progress Notes (Signed)
 Occupational Therapy Treatment Patient Details Name: Derek French MRN: [id number] DOB: 1966-01-05 Today's Date: 05/26/2023   History of present illness 58 yo male presenting 3/31 with chest pain SOB and weakness. CT negative for infarct s/p TNK. MRI negative for acute abnormality. PMH stroke due to vasospasm with cocaine abuse, CKD, cancer, dementia, CAD, CHF, MI, COPD, T2DM, hepatitis, fibromyalgia, sickle cell, multiple myeloma   OT comments  Pt progressing well in OT sessions, RLE continues to drag, educated pt on use of toe lifter for RLE. Pt gait pattern improved with use of toe lifter. Pt educated on scanning and anchoring strategies to reduce risk of falls given his field deficts. Pt dressing self with setup A.  OT to continue to progress pt as able, updated recs to outpatient as pt is Dc'ing home today.       If plan is discharge home, recommend the following:  A little help with walking and/or transfers;A little help with bathing/dressing/bathroom   Equipment Recommendations  None recommended by OT    Recommendations for Other Services      Precautions / Restrictions Precautions Precautions: Fall Recall of Precautions/Restrictions: Intact Restrictions Weight Bearing Restrictions Per Provider Order: No       Mobility Bed Mobility Overal bed mobility: Modified Independent                  Transfers Overall transfer level: Modified independent Equipment used: None                     Balance Overall balance assessment: Mild deficits observed, not formally tested                                         ADL either performed or assessed with clinical judgement   ADL                   Upper Body Dressing : Set up;Sitting   Lower Body Dressing: Sit to/from stand;Supervision/safety   Toilet Transfer: Contact guard assist;Rolling walker (2 wheels) (toe lifter)           Functional mobility during ADLs: Contact guard  assist;Rolling walker (2 wheels) (using toe lifter) General ADL Comments: Educated pt on the use of toe lifter, pt demonstrated ability to doff/don RLE toe lifter without assist. Educated pt on anchoring strategies and fall prevention education    Extremity/Trunk Insurance account manager Communication Communication: No apparent difficulties   Cognition Arousal: Alert Behavior During Therapy: WFL for tasks assessed/performed Cognition: No family/caregiver present to determine baseline                               Following commands: Intact        Cueing   Cueing Techniques: Verbal cues  Exercises Other Exercises Other Exercises: trailsmaking part A & B as an intervention. empshasis on scanning and anchoring Other Exercises: stacking cups    Shoulder Instructions       General Comments      Pertinent Vitals/ Pain       Pain Assessment Pain Assessment: Faces Faces Pain Scale: Hurts a little bit Pain Location: headache Pain Descriptors / Indicators:  Headache Pain Intervention(s): Monitored during session  Home Living                                          Prior Functioning/Environment              Frequency  Min 3X/week        Progress Toward Goals  OT Goals(current goals can now be found in the care plan section)  Progress towards OT goals: Progressing toward goals  Acute Rehab OT Goals Patient Stated Goal: to go to work and leave tomorrow OT Goal Formulation: With patient Time For Goal Achievement: 06/07/23 Potential to Achieve Goals: Good  Plan      Co-evaluation                 AM-PAC OT "6 Clicks" Daily Activity     Outcome Measure   Help from another person eating meals?: None Help from another person taking care of personal grooming?: A Little Help from another person toileting, which includes using toliet, bedpan, or urinal?: A  Little Help from another person bathing (including washing, rinsing, drying)?: A Little Help from another person to put on and taking off regular upper body clothing?: A Little Help from another person to put on and taking off regular lower body clothing?: A Little 6 Click Score: 19    End of Session Equipment Utilized During Treatment: Gait belt;Rolling walker (2 wheels)  OT Visit Diagnosis: Unsteadiness on feet (R26.81);Muscle weakness (generalized) (M62.81);Low vision, both eyes (H54.2);Hemiplegia and hemiparesis Hemiplegia - Right/Left: Right Hemiplegia - dominant/non-dominant: Dominant Hemiplegia - caused by: Unspecified   Activity Tolerance Patient tolerated treatment well   Patient Left in bed;with call bell/phone within reach (sitting EOB awaiting DC)   Nurse Communication Mobility status;Precautions        Time: 9147-8295 OT Time Calculation (min): 36 min  Charges: OT General Charges $OT Visit: 1 Visit OT Treatments $Therapeutic Activity: 23-37 mins  05/26/2023  AB, OTR/L  Acute Rehabilitation Services  Office: [phone]   [name] 05/26/2023, 2:48 PM

## 2023-05-26 NOTE — Discharge Summary (Addendum)
 Stroke Discharge Summary  Patient ID: Derek French   MRN: [id number]      DOB: May 05, 1965  Date of Admission: 05/23/2023 Date of Discharge: 05/26/2023  Attending Physician:  Stroke, Md, MD Consultant(s):    None  Patient's PCP:  Derek Fortis, MD  DISCHARGE PRIMARY DIAGNOSIS:  Possible functional neurologic disorder, but TIA on DDx Stroke like symptoms with right HP, HH, aphasia s/p TNK  Secondary diagnosis Hx of stroke like symptoms s/p tPA HTN HLD Smoker  Hx of substance abuse CAD   Allergies as of 05/26/2023       Reactions   Bee Venom Anaphylaxis   Codeine Other (See Comments)   hallucinations        Medication List     STOP taking these medications    naproxen sodium 220 MG tablet Commonly known as: ALEVE       TAKE these medications    amLODipine 10 MG tablet Commonly known as: NORVASC Take 1 tablet (10 mg total) by mouth daily.   aspirin EC 81 MG tablet Take 1 tablet (81 mg total) by mouth daily. Swallow whole. Start taking on: May 27, 2023 What changed:  how much to take when to take this   atorvastatin 80 MG tablet Commonly known as: LIPITOR Take 1 tablet (80 mg total) by mouth daily.   butalbital-acetaminophen-caffeine 50-325-40 MG tablet Commonly known as: FIORICET Take 2 tablets by mouth every 8 (eight) hours as needed for headache.   calcium carbonate 500 MG chewable tablet Commonly known as: TUMS - dosed in mg elemental calcium Chew 2 tablets by mouth as needed for indigestion or heartburn.   diclofenac Sodium 1 % Gel Commonly known as: VOLTAREN Apply 4 g topically 4 (four) times daily.   indomethacin 50 MG capsule Commonly known as: INDOCIN Take 1 capsule (50 mg total) by mouth 2 (two) times daily with a meal.   lidocaine 5 % Commonly known as: Lidoderm Place 1 patch onto the skin daily. Remove & Discard patch within 12 hours or as directed by MD   nicotine 21 mg/24hr patch Commonly known as: NICODERM CQ - dosed in  mg/24 hours Place 1 patch (21 mg total) onto the skin daily. Start taking on: May 27, 2023   pantoprazole 40 MG tablet Commonly known as: PROTONIX Take 1 tablet (40 mg total) by mouth daily. Start taking on: May 27, 2023 What changed: when to take this               Durable Medical Equipment  (From admission, onward)           Start     Ordered   05/26/23 1233  For home use only DME Walker rolling  Once       Question Answer Comment  Walker: With 5 Inch Wheels   Patient needs a walker to treat with the following condition Weakness      05/26/23 1233   05/26/23 1147  For home use only DME Cane  Once        05/26/23 1147            LABORATORY STUDIES CBC    Component Value Date/Time   WBC 9.5 05/26/2023 0709   RBC 4.82 05/26/2023 0709   HGB 15.3 05/26/2023 0709   HGB 12.1 (L) 01/25/2022 1537   HCT 44.2 05/26/2023 0709   HCT 34.9 (L) 01/25/2022 1537   PLT 211 05/26/2023 0709   PLT 329 01/25/2022 1537  MCV 91.7 05/26/2023 0709   MCV 94 01/25/2022 1537   MCH 31.7 05/26/2023 0709   MCHC 34.6 05/26/2023 0709   RDW 13.9 05/26/2023 0709   RDW 12.8 01/25/2022 1537   LYMPHSABS 2.4 05/23/2023 1746   MONOABS 0.6 05/23/2023 1746   EOSABS 0.1 05/23/2023 1746   BASOSABS 0.0 05/23/2023 1746   CMP    Component Value Date/Time   NA 137 05/26/2023 0709   NA 141 05/02/2019 1531   K 4.0 05/26/2023 0709   CL 105 05/26/2023 0709   CO2 22 05/26/2023 0709   GLUCOSE 96 05/26/2023 0709   BUN 11 05/26/2023 0709   BUN 18 05/02/2019 1531   CREATININE 0.95 05/26/2023 0709   CALCIUM 8.8 (L) 05/26/2023 0709   PROT 6.0 (L) 05/23/2023 1746   PROT 6.9 05/02/2019 1531   ALBUMIN 3.1 (L) 05/23/2023 1746   ALBUMIN 4.3 05/02/2019 1531   AST 16 05/23/2023 1746   ALT 10 05/23/2023 1746   ALKPHOS 41 05/23/2023 1746   BILITOT 1.2 05/23/2023 1746   BILITOT 0.4 05/02/2019 1531   GFRNONAA >60 05/26/2023 0709   GFRAA 85 05/02/2019 1531   COAGS Lab Results  Component Value  Date   INR 1.0 05/23/2023   INR 1.1 01/16/2022   INR 1.11 07/01/2015   Lipid Panel    Component Value Date/Time   CHOL 188 05/24/2023 0632   CHOL 193 05/02/2019 1531   TRIG 55 05/24/2023 0632   HDL 45 05/24/2023 0632   HDL 54 05/02/2019 1531   CHOLHDL 4.2 05/24/2023 0632   VLDL 11 05/24/2023 0632   LDLCALC 132 (H) 05/24/2023 0632   LDLCALC 128 (H) 05/02/2019 1531   HgbA1C  Lab Results  Component Value Date   HGBA1C 5.3 05/23/2023   Alcohol Level    Component Value Date/Time   ETH <10 05/23/2023 1939     SIGNIFICANT DIAGNOSTIC STUDIES MR BRAIN WO CONTRAST Result Date: 05/24/2023 CLINICAL DATA:  Stroke follow-up EXAM: MRI HEAD WITHOUT CONTRAST TECHNIQUE: Multiplanar, multiecho pulse sequences of the brain and surrounding structures were obtained without intravenous contrast. COMPARISON:  07/02/2015 and 05/23/2023 FINDINGS: Brain: No acute infarct, mass effect or extra-axial collection. No acute or chronic hemorrhage. There is multifocal hyperintense T2-weighted signal within the white matter. Parenchymal volume and CSF spaces are normal. The midline structures are normal. Vascular: Normal flow voids. Skull and upper cervical spine: Normal calvarium and skull base. Visualized upper cervical spine and soft tissues are normal. Sinuses/Orbits:No paranasal sinus fluid levels or advanced mucosal thickening. No mastoid or middle ear effusion. Normal orbits. IMPRESSION: 1. No acute intracranial abnormality. 2. Findings of chronic small vessel ischemia. Electronically Signed   By: Deatra Robinson M.D.   On: 05/24/2023 20:01   ECHOCARDIOGRAM COMPLETE Result Date: 05/24/2023    ECHOCARDIOGRAM REPORT   Patient Name:   Derek French Date of Exam: 05/24/2023 Medical Rec #:  [id number]      Height:       74.0 in Accession #:    [id number]     Weight:       160.3 lb Date of Birth:  Feb 22, 1966       BSA:          1.978 m Patient Age:    57 years       BP:           92/67 mmHg Patient Gender: M               HR:  64 bpm. Exam Location:  Inpatient Procedure: 2D Echo (Both Spectral and Color Flow Doppler were utilized during            procedure). Indications:    Stroke  History:        Patient has prior history of Echocardiogram examinations.                 Signs/Symptoms:Chest Pain; Risk Factors:Diabetes, Dyslipidemia,                 Current Smoker and Polysubstance abuse.  Sonographer:    Lamont Snowball Referring Phys: [id number] Lynnae January IMPRESSIONS  1. Left ventricular ejection fraction, by estimation, is 60 to 65%. The left ventricle has normal function. The left ventricle has no regional wall motion abnormalities. There is mild concentric left ventricular hypertrophy of the basal-septal segment. Left ventricular diastolic parameters are indeterminate.  2. Right ventricular systolic function is normal. The right ventricular size is normal.  3. The mitral valve is normal in structure. Mild to moderate mitral valve regurgitation. No evidence of mitral stenosis.  4. The aortic valve is normal in structure. There is moderate calcification of the aortic valve. Aortic valve regurgitation is mild. Aortic valve sclerosis/calcification is present, without any evidence of aortic stenosis.  5. The inferior vena cava is normal in size with greater than 50% respiratory variability, suggesting right atrial pressure of 3 mmHg. FINDINGS  Left Ventricle: Left ventricular ejection fraction, by estimation, is 60 to 65%. The left ventricle has normal function. The left ventricle has no regional wall motion abnormalities. The left ventricular internal cavity size was normal in size. There is  mild concentric left ventricular hypertrophy of the basal-septal segment. Left ventricular diastolic parameters are indeterminate. Right Ventricle: The right ventricular size is normal. No increase in right ventricular wall thickness. Right ventricular systolic function is normal. Left Atrium: Left atrial size was normal in size. Right  Atrium: Right atrial size was normal in size. Pericardium: There is no evidence of pericardial effusion. Mitral Valve: The mitral valve is normal in structure. Mild to moderate mitral valve regurgitation. No evidence of mitral valve stenosis. Tricuspid Valve: The tricuspid valve is normal in structure. Tricuspid valve regurgitation is trivial. No evidence of tricuspid stenosis. Aortic Valve: The aortic valve is normal in structure. There is moderate calcification of the aortic valve. Aortic valve regurgitation is mild. Aortic regurgitation PHT measures 645 msec. Aortic valve sclerosis/calcification is present, without any evidence of aortic stenosis. Aortic valve mean gradient measures 6.0 mmHg. Aortic valve peak gradient measures 14.4 mmHg. Aortic valve area, by VTI measures 3.02 cm. Pulmonic Valve: The pulmonic valve was normal in structure. Pulmonic valve regurgitation is not visualized. No evidence of pulmonic stenosis. Aorta: The aortic root is normal in size and structure. Venous: The inferior vena cava is normal in size with greater than 50% respiratory variability, suggesting right atrial pressure of 3 mmHg. IAS/Shunts: No atrial level shunt detected by color flow Doppler.  LEFT VENTRICLE PLAX 2D LVIDd:         4.40 cm   Diastology LVIDs:         2.70 cm   LV e' medial:    9.25 cm/s LV PW:         0.90 cm   LV E/e' medial:  5.6 LV IVS:        1.10 cm   LV e' lateral:   16.10 cm/s LVOT diam:     2.10 cm   LV E/e'  lateral: 3.2 LV SV:         88 LV SV Index:   44 LVOT Area:     3.46 cm  RIGHT VENTRICLE             IVC RV Basal diam:  3.10 cm     IVC diam: 1.10 cm RV S prime:     14.60 cm/s TAPSE (M-mode): 2.5 cm LEFT ATRIUM             Index        RIGHT ATRIUM           Index LA Vol (A2C):   34.7 ml 17.54 ml/m  RA Area:     11.50 cm LA Vol (A4C):   20.8 ml 10.51 ml/m  RA Volume:   23.90 ml  12.08 ml/m LA Biplane Vol: 27.1 ml 13.70 ml/m  AORTIC VALVE AV Area (Vmax):    2.70 cm AV Area (Vmean):   2.65  cm AV Area (VTI):     3.02 cm AV Vmax:           190.00 cm/s AV Vmean:          115.000 cm/s AV VTI:            0.291 m AV Peak Grad:      14.4 mmHg AV Mean Grad:      6.0 mmHg LVOT Vmax:         148.00 cm/s LVOT Vmean:        88.000 cm/s LVOT VTI:          0.254 m LVOT/AV VTI ratio: 0.87 AI PHT:            645 msec  AORTA Ao Root diam: 3.00 cm MITRAL VALVE MV Area (PHT): 2.46 cm    SHUNTS MV Decel Time: 309 msec    Systemic VTI:  0.25 m MV E velocity: 51.40 cm/s  Systemic Diam: 2.10 cm MV A velocity: 59.70 cm/s MV E/A ratio:  0.86 Arvilla Meres MD Electronically signed by Arvilla Meres MD Signature Date/Time: 05/24/2023/9:05:28 AM    Final    CT ANGIO CHEST/ABD/PEL FOR DISSECTION W &/OR WO CONTRAST Result Date: 05/23/2023 CLINICAL DATA:  Right-sided weakness, chest pain EXAM: CT ANGIOGRAPHY CHEST, ABDOMEN AND PELVIS TECHNIQUE: Non-contrast CT of the chest was initially obtained. Multidetector CT imaging through the chest, abdomen and pelvis was performed using the standard protocol during bolus administration of intravenous contrast. Multiplanar reconstructed images and MIPs were obtained and reviewed to evaluate the vascular anatomy. RADIATION DOSE REDUCTION: This exam was performed according to the departmental dose-optimization program which includes automated exposure control, adjustment of the mA and/or kV according to patient size and/or use of iterative reconstruction technique. CONTRAST:  OMNIPAQUE IOHEXOL 350 MG/ML SOLN COMPARISON:  01/16/2022, 08/30/2022, 05/20/2023 FINDINGS: CTA CHEST FINDINGS Cardiovascular: This is a limited evaluation of the thoracic aorta due to timing of contrast bolus. No gross evidence of thoracic aortic aneurysm or dissection. The heart is unremarkable without pericardial effusion. Insufficient opacification of the pulmonary vasculature limits evaluation for pulmonary emboli. Mediastinum/Nodes: No enlarged mediastinal, hilar, or axillary lymph nodes. Thyroid  gland, trachea, and esophagus demonstrate no significant findings. Lungs/Pleura: Stable upper lobe predominant emphysema. No acute airspace disease, effusion, or pneumothorax. Bilateral lower lobe subsegmental atelectasis. Central airways are patent. Musculoskeletal: No acute or destructive bony abnormalities. Reconstructed images demonstrate no additional findings. Review of the MIP images confirms the above findings. CTA ABDOMEN AND PELVIS FINDINGS VASCULAR Evaluation is  slightly limited due to timing of contrast bolus. Aorta: Normal caliber aorta without aneurysm, dissection, vasculitis or significant stenosis. Mild atherosclerosis. Celiac: Patent without evidence of aneurysm, dissection, vasculitis or significant stenosis. SMA: Patent without evidence of aneurysm, dissection, vasculitis or significant stenosis. Renals: Both renal arteries are patent without evidence of aneurysm, dissection, vasculitis, fibromuscular dysplasia or significant stenosis. IMA: Patent without evidence of aneurysm, dissection, vasculitis or significant stenosis. Inflow: 1.7 cm right common iliac artery aneurysm. Atherosclerosis of the bilateral common iliac arteries. No evidence of dissection, critical stenosis, or vasculitis. Veins: No obvious venous abnormality within the limitations of this arterial phase study. Review of the MIP images confirms the above findings. NON-VASCULAR Hepatobiliary: No focal liver abnormality is seen. No gallstones, gallbladder wall thickening, or biliary dilatation. Pancreas: Unremarkable. No pancreatic ductal dilatation or surrounding inflammatory changes. Spleen: Normal in size without focal abnormality. Adrenals/Urinary Tract: The adrenals and kidneys are stable. No urinary tract calculi or obstructive uropathy. Bladder is unremarkable. Stomach/Bowel: No bowel obstruction or ileus. Normal appendix right lower quadrant. No bowel wall thickening or inflammatory change. Small hiatal hernia. Lymphatic: No  pathologic adenopathy. Reproductive: Stable enlargement of the prostate. Other: No free fluid or free intraperitoneal gas. No abdominal wall hernia. Musculoskeletal: No acute or destructive bony abnormalities. Reconstructed images demonstrate no additional findings. Review of the MIP images confirms the above findings. IMPRESSION: Vascular: 1. Limited evaluation due to timing of contrast bolus. 2. No evidence of thoracoabdominal aortic aneurysm or dissection. 3. Incidental 1.7 cm right common iliac artery aneurysm. 4. Nondiagnostic evaluation of the pulmonary vasculature. 5.  Aortic Atherosclerosis (ICD10-I70.0). Nonvascular: 1. No acute intrathoracic, intra-abdominal, or intrapelvic process. 2. Aortic Atherosclerosis (ICD10-I70.0) and Emphysema (ICD10-J43.9). 3. Stable enlarged prostate. Electronically Signed   By: Sharlet Salina M.D.   On: 05/23/2023 18:54   CT ANGIO HEAD NECK W WO CM W PERF (CODE STROKE) Result Date: 05/23/2023 CLINICAL DATA:  Neuro deficit, concern for stroke EXAM: CT ANGIOGRAPHY OF THE HEAD AND NECK CT PERFUSION BRAIN TECHNIQUE: Contiguous axial images were obtained from the base of the skull through the vertex without intravenous contrast. Multidetector CT imaging of the head and neck was performed using the standard protocol during bolus administration of intravenous contrast. Multiplanar CT image reconstructions and MIPs were obtained to evaluate the vascular anatomy. Carotid stenosis measurements (when applicable) are obtained utilizing NASCET criteria, using the distal internal carotid diameter as the denominator. Multiphase CT imaging of the brain was performed following IV bolus contrast injection. Subsequent parametric perfusion maps were calculated using RAPID software. RADIATION DOSE REDUCTION: This exam was performed according to the departmental dose-optimization program which includes automated exposure control, adjustment of the mA and/or kV according to patient size and/or use  of iterative reconstruction technique. CONTRAST:  OMNIPAQUE IOHEXOL 350 MG/ML SOLN COMPARISON:  Same day CT head.  CTA head 07/01/2015. FINDINGS: CTA NECK FINDINGS Aortic arch: Common origin of the brachiocephalic and left common carotid arteries. Imaged portion shows no evidence of aneurysm or dissection. No significant stenosis of the major arch vessel origins. Pulmonary arteries: As permitted by contrast timing, there are no filling defects in the visualized pulmonary arteries. Subclavian arteries: The subclavian arteries are patent bilaterally. Right carotid system: No evidence of dissection, stenosis (50% or greater), or occlusion. Left carotid system: No evidence of dissection, stenosis (50% or greater), or occlusion. Vertebral arteries: Codominant. No evidence of dissection, stenosis (50% or greater), or occlusion. Skeleton: No acute or aggressive finding. Degenerative changes in the cervical spine with disc  space narrowing greatest at C4-5. Dental caries. Other neck: The visualized airway is patent. No cervical lymphadenopathy. Upper chest: Paraseptal emphysema noted. Review of the MIP images confirms the above findings CTA HEAD FINDINGS ANTERIOR CIRCULATION: The intracranial ICAs are patent bilaterally. No significant stenosis, proximal occlusion, aneurysm, or vascular malformation. MCAs: The middle cerebral arteries are patent bilaterally. ACAs: The anterior cerebral arteries are patent bilaterally. POSTERIOR CIRCULATION: No significant stenosis, proximal occlusion, aneurysm, or vascular malformation. PCAs: The posterior cerebral arteries are patent bilaterally. Pcomm: The posterior communicating arteries are visualized bilaterally. SCAs: The superior cerebellar arteries are patent bilaterally. Basilar artery: Patent AICAs: Visualized on the right. PICAs: Visualized on the left. Vertebral arteries: The intracranial vertebral arteries are patent. Venous sinuses: As permitted by contrast timing,  patent. Anatomic variants: None Review of the MIP images confirms the above findings CT Brain Perfusion Findings: ASPECTS: 10 CBF (<30%) Volume: 0mL Perfusion (Tmax>6.0s) volume: 0mL Mismatch Volume: 0mL Infarction Location:None identified IMPRESSION: No large vessel occlusion. No high-grade stenosis of the arteries in the head and neck. No core infarct identified on CT perfusion. Electronically Signed   By: Emily Filbert M.D.   On: 05/23/2023 18:39   CT HEAD CODE STROKE WO CONTRAST Result Date: 05/23/2023 CLINICAL DATA:  Code stroke. Right-sided numbness and right-sided weakness, difficulty talking. EXAM: CT HEAD WITHOUT CONTRAST TECHNIQUE: Contiguous axial images were obtained from the base of the skull through the vertex without intravenous contrast. RADIATION DOSE REDUCTION: This exam was performed according to the departmental dose-optimization program which includes automated exposure control, adjustment of the mA and/or kV according to patient size and/or use of iterative reconstruction technique. COMPARISON:  MRI head 07/02/2015 FINDINGS: Brain: No acute intracranial hemorrhage. No CT evidence of acute infarct. Nonspecific hypoattenuation in the periventricular and subcortical white matter favored to reflect chronic microvascular ischemic changes. No edema, mass effect, or midline shift. The basilar cisterns are patent. Ventricles: The ventricles are normal. Vascular: No hyperdense vessel or unexpected calcification. Skull: No acute or aggressive finding. Orbits: Orbits are symmetric. Sinuses: The visualized paranasal sinuses are clear. Other: Mastoid air cells are clear. ASPECTS [facility] Stroke Program Early CT Score) - Ganglionic level infarction (caudate, lentiform nuclei, internal capsule, insula, M1-M3 cortex): 7 - Supraganglionic infarction (M4-M6 cortex): 3 Total score (0-10 with 10 being normal): 10 IMPRESSION: 1. No CT evidence of acute intracranial abnormality. 2. Mild chronic microvascular  ischemic changes. 3. ASPECTS is 10 These results were communicated to Dr. Armanda Magic At 6:01 pm on 05/23/2023 by text page via the [facility] messaging system. Electronically Signed   By: Emily Filbert M.D.   On: 05/23/2023 18:05   DG Chest Portable 1 View Result Date: 05/20/2023 CLINICAL DATA:  chest pain EXAM: PORTABLE CHEST - 1 VIEW COMPARISON:  08/30/2022 FINDINGS: Lungs are clear.  No pneumothorax. Heart size and mediastinal contours are within normal limits. No effusion. Visualized bones unremarkable. IMPRESSION: No acute cardiopulmonary disease. Electronically Signed   By: Corlis Leak M.D.   On: 05/20/2023 09:18       HISTORY OF PRESENT ILLNESS 58 y.o. patient with history of CKD, hypertension, GI bleeding, multiple strokelike episodes with negative MRI, hyperlipidemia and smoking who originally presented with chest discomfort and then developed right hemiparesis and right-sided tingling and numbness.  HOSPITAL COURSE Patient was treated with TNK to treat possible stroke, but MRI revealed no stroke.  He did develop a severe headache, which was treated with Fioricet and a headache cocktail.  Patient is now stable and ready to be  discharged with outpatient PT/OT.  Possible functional neurologic disorder, but TIA on DDx Stroke like symptoms with right HP, HH, aphasia s/p TNK Pt presented to ED for chest pain and SOB, while in ED, he became hot all over and then felt right sided numbness tingling and then weakness. Still complaining of right sided decreased sensation and strength today but on exam some functional component Code Stroke CT head No acute abnormality. Small vessel disease. ASPECTS 10.    CTA head & neck No large vessel occlusion. No high-grade stenosis of the arteries in the head and neck. MRI no acute infarct 2D Echo EF 60-65% LDL 122 HgbA1c 5.3 VTE prophylaxis - SCDs Taking 2 aspirin 81 mg daily prior to admission, now aspirin 81.  Continue on discharge Therapy recommendations:   outpatient therapy Disposition:  Pending    Hx of Stroke/TIA 06/2015 admitted with right HP and right field cut, s/p tPA, cocaine positive. MRI negative 09/2011 admitted with R HP s/p IV tPA. MRI neg - cocaine positive, marijuana positive 03/2011 admitted with R HP and R field cut. MRI neg. cocaine positive and marijuana positive 07/2009 treated for stroke at [facility]. MRI neg. Cocaine positive   Hypertension Home meds:  norvasc 10 milligrams daily Stable Long-term BP goal normotensive   Hyperlipidemia Home meds:  lipitor 80, not taking due to cost  LDL 122, goal < 70 On Lipitor 80 Case manager consulted for cost, good Rx card given Continue statin at discharge   Chest pain, resolved Presented to ED on 3/28 and yesterday for chest pain EKG showed LVH, no STEMI Troponin level negative   Tobacco Abuse Patient smokes 0.5 packs per day for 22 years       Ready to quit? Yes Nicotine replacement therapy provided   Hx of substance Abuse  Patient uses Cocaine, Marijuana UDS negative this time for THC and Cocaine Continue abstain from substance    Other Stroke Risk Factors ETOH use, alcohol level <10, advised to drink no more than 2 drink(s) a day CAD / MI   DISCHARGE EXAM  PHYSICAL EXAM General:  Alert, well-nourished, well-developed patient in no acute distress Psych:  Mood and affect appropriate for situation CV: Regular rate and rhythm on monitor Respiratory:  Regular, unlabored respirations on room air  NEURO:  Mental Status: AA&Ox3  Speech/Language: speech is without dysarthria or aphasia.    Cranial Nerves:  II: PERRL. Visual fields full.  III, IV, VI: EOMI. Eyelids elevate symmetrically.  V: Sensation is intact to light touch but diminished on the right with a tingling sensation VII: Smile is symmetrical.  VIII: hearing intact to voice. IX, X: Phonation is normal.  [id number] shrug 5/5. XII: tongue is midline without fasciculations. Motor: 5/5 strength to all  muscle groups tested.  Tone: is normal and bulk is normal Sensation- Intact to light touch bilaterally but diminished on the right with a tingling sensation present Coordination: FTN intact bilaterally Gait- deferred  1a Level of Conscious.: 0 1b LOC Questions: 0 1c LOC Commands: 0 2 Best Gaze: 0 3 Visual: 0 4 Facial Palsy: 0 5a Motor Arm - left: 0 5b Motor Arm - Right: 0 6a Motor Leg - Left: 0 6b Motor Leg - Right: 0 7 Limb Ataxia: 0 8 Sensory: 1 9 Best Language: 0 10 Dysarthria: 0 11 Extinct. and Inatten.: 0 TOTAL: 1   Discharge Diet       Diet   Diet Heart Room service appropriate? Yes with Assist; Fluid consistency: Thin  liquids  DISCHARGE PLAN Disposition: Home aspirin 81 mg daily for secondary stroke prevention  Ongoing stroke risk factor control by Primary Care Physician at time of discharge Follow-up PCP Derek Fortis, MD in 2 weeks. Follow-up in [facility] in 8 weeks, office to schedule an appointment.   35 minutes were spent preparing discharge.  Cortney E Ernestina Columbia , MSN, AGACNP-BC [facility] See Amion for schedule and pager information 05/26/2023 12:59 PM   ATTENDING NOTE: I reviewed above note and agree with the assessment and plan. Pt was seen and examined.   Pt was seen while preparing for discharge. He had headache this morning, not relieved by fioricet. He received migraine cocktail and improved. Continue ASA and statin, medically stable for discharge. Continue outpt therapy. Follow up at [facility].   For detailed assessment and plan, please refer to above as I have made changes wherever appropriate.   Marvel Plan, MD PhD Stroke Neurology 05/26/2023 9:42 PM

## 2023-12-26 ENCOUNTER — Encounter (HOSPITAL_COMMUNITY): Payer: Self-pay | Admitting: Pharmacy Technician

## 2023-12-26 ENCOUNTER — Other Ambulatory Visit: Payer: Self-pay

## 2023-12-26 ENCOUNTER — Emergency Department (HOSPITAL_COMMUNITY)

## 2023-12-26 ENCOUNTER — Emergency Department (HOSPITAL_COMMUNITY)
Admission: EM | Admit: 2023-12-26 | Discharge: 2023-12-26 | Disposition: A | Discharge status: Home / Self Care | Attending: Emergency Medicine | Admitting: Emergency Medicine

## 2023-12-26 DIAGNOSIS — K921 Melena: Secondary | ICD-10-CM | POA: Insufficient documentation

## 2023-12-26 DIAGNOSIS — Z7982 Long term (current) use of aspirin: Secondary | ICD-10-CM | POA: Insufficient documentation

## 2023-12-26 DIAGNOSIS — R0602 Shortness of breath: Secondary | ICD-10-CM | POA: Diagnosis not present

## 2023-12-26 DIAGNOSIS — S2341XA Sprain of ribs, initial encounter: Secondary | ICD-10-CM | POA: Insufficient documentation

## 2023-12-26 DIAGNOSIS — R079 Chest pain, unspecified: Secondary | ICD-10-CM | POA: Diagnosis present

## 2023-12-26 DIAGNOSIS — I10 Essential (primary) hypertension: Secondary | ICD-10-CM | POA: Insufficient documentation

## 2023-12-26 DIAGNOSIS — Z79899 Other long term (current) drug therapy: Secondary | ICD-10-CM | POA: Diagnosis not present

## 2023-12-26 DIAGNOSIS — X509XXA Other and unspecified overexertion or strenuous movements or postures, initial encounter: Secondary | ICD-10-CM | POA: Diagnosis not present

## 2023-12-26 DIAGNOSIS — Z720 Tobacco use: Secondary | ICD-10-CM | POA: Insufficient documentation

## 2023-12-26 LAB — CBC WITH DIFFERENTIAL/PLATELET
Abs Immature Granulocytes: 0.02 K/uL (ref 0.00–0.07)
Basophils Absolute: 0 K/uL (ref 0.0–0.1)
Basophils Relative: 1 %
Eosinophils Absolute: 0.2 K/uL (ref 0.0–0.5)
Eosinophils Relative: 3 %
HCT: 42.2 % (ref 39.0–52.0)
Hemoglobin: 14.1 g/dL (ref 13.0–17.0)
Immature Granulocytes: 0 %
Lymphocytes Relative: 29 %
Lymphs Abs: 1.7 K/uL (ref 0.7–4.0)
MCH: 31.9 pg (ref 26.0–34.0)
MCHC: 33.4 g/dL (ref 30.0–36.0)
MCV: 95.5 fL (ref 80.0–100.0)
Monocytes Absolute: 0.5 K/uL (ref 0.1–1.0)
Monocytes Relative: 9 %
Neutro Abs: 3.4 K/uL (ref 1.7–7.7)
Neutrophils Relative %: 58 %
Platelets: 285 K/uL (ref 150–400)
RBC: 4.42 MIL/uL (ref 4.22–5.81)
RDW: 13.7 % (ref 11.5–15.5)
WBC: 5.8 K/uL (ref 4.0–10.5)
nRBC: 0 % (ref 0.0–0.2)

## 2023-12-26 LAB — COMPREHENSIVE METABOLIC PANEL WITH GFR
ALT: 13 U/L (ref 0–44)
AST: 19 U/L (ref 15–41)
Albumin: 3.2 g/dL — ABNORMAL LOW (ref 3.5–5.0)
Alkaline Phosphatase: 62 U/L (ref 38–126)
Anion gap: 13 (ref 5–15)
BUN: 12 mg/dL (ref 6–20)
CO2: 20 mmol/L — ABNORMAL LOW (ref 22–32)
Calcium: 8.6 mg/dL — ABNORMAL LOW (ref 8.9–10.3)
Chloride: 110 mmol/L (ref 98–111)
Creatinine, Ser: 1.19 mg/dL (ref 0.61–1.24)
GFR, Estimated: >60 mL/min (ref >60)
Glucose, Bld: 152 mg/dL — ABNORMAL HIGH (ref 70–99)
Potassium: 3.7 mmol/L (ref 3.5–5.1)
Sodium: 143 mmol/L (ref 135–145)
Total Bilirubin: 0.5 mg/dL (ref 0.0–1.2)
Total Protein: 6.1 g/dL — ABNORMAL LOW (ref 6.5–8.1)

## 2023-12-26 LAB — LIPASE, BLOOD: Lipase: 35 U/L (ref 11–51)

## 2023-12-26 LAB — BRAIN NATRIURETIC PEPTIDE: B Natriuretic Peptide: 33.7 pg/mL (ref 0.0–100.0)

## 2023-12-26 LAB — TROPONIN I (HIGH SENSITIVITY)
Troponin I (High Sensitivity): 11 ng/L (ref <18)
Troponin I (High Sensitivity): 11 ng/L (ref <18)

## 2023-12-26 MED ORDER — IBUPROFEN 800 MG PO TABS: 800.0000 mg | ORAL_TABLET | Freq: Three times a day (TID) | ORAL | 0 refills | Status: DC

## 2023-12-26 MED ORDER — PANTOPRAZOLE SODIUM 20 MG PO TBEC: 20.0000 mg | DELAYED_RELEASE_TABLET | Freq: Every day | ORAL | 1 refills | Status: AC

## 2023-12-26 MED ORDER — LIDOCAINE 5 % EX PTCH
1.0000 | MEDICATED_PATCH | CUTANEOUS | Status: DC
  Administered 2023-12-26: 1 via TRANSDERMAL
  Filled 2023-12-26: qty 1

## 2023-12-26 MED ORDER — OMEPRAZOLE 20 MG PO CPDR
20.0000 mg | DELAYED_RELEASE_CAPSULE | Freq: Two times a day (BID) | ORAL | 1 refills | Status: DC
Start: 2023-12-26 — End: 2023-12-26

## 2023-12-26 MED ORDER — METHOCARBAMOL 1000 MG/10ML IJ SOLN
500.0000 mg | Freq: Once | INTRAMUSCULAR | Status: AC
  Administered 2023-12-26: 500 mg via INTRAMUSCULAR
  Filled 2023-12-26: qty 10

## 2023-12-26 MED ORDER — METHOCARBAMOL 500 MG PO TABS: 500.0000 mg | ORAL_TABLET | Freq: Two times a day (BID) | ORAL | 0 refills | Status: DC

## 2023-12-26 MED ORDER — ALUM & MAG HYDROXIDE-SIMETH 200-200-20 MG/5ML PO SUSP
30.0000 mL | Freq: Once | ORAL | Status: AC
  Administered 2023-12-26: 30 mL via ORAL
  Filled 2023-12-26: qty 30

## 2023-12-26 MED ORDER — KETOROLAC TROMETHAMINE 15 MG/ML IJ SOLN
15.0000 mg | Freq: Once | INTRAMUSCULAR | Status: AC
  Administered 2023-12-26: 15 mg via INTRAMUSCULAR
  Filled 2023-12-26: qty 1

## 2023-12-26 NOTE — ED Triage Notes (Signed)
 Pt states chest pain started yesterday, thought it was indigestion but has gotten worse through the night. Pt states it feels like someone is sitting on his chest, short of breath and hard to complete full sentences on check in.

## 2023-12-26 NOTE — Medical Student Note (Incomplete)
 [facility]-EMERGENCY DEPT Provider Student Note For educational purposes for Medical, PA and NP students only and not part of the legal medical record.   CSN: [id number] Arrival date & time: 12/26/23  9171      History   Chief Complaint Chief Complaint  Patient presents with  . Chest Pain  . Shortness of Breath    HPI Derek French is a 58 y.o. male with PMH of    Chest Pain Associated symptoms: shortness of breath   Shortness of Breath Associated symptoms: chest pain     Past Medical History:  Diagnosis Date  . Anemia due to GI blood loss 01/16/2022  . Chest pain 08/30/2022  . Encounter for hepatitis C screening test for low risk patient 01/25/2022  . Hypertension 05/03/2019  . Recurrent boils   . Stroke-like episode    07/2009, 03/2011, 09/2011    Patient Active Problem List   Diagnosis Date Noted  . Stroke (HCC) 05/23/2023  . Chest pain on breathing 08/31/2022  . Pleuritic chest pain 08/30/2022  . Tobacco abuse 08/30/2022  . Encounter for hepatitis C screening test for low risk patient 01/25/2022  . Weight loss 01/25/2022  . Duodenal ulcer 01/19/2022  . Right lower quadrant abdominal pain 01/18/2022  . Lower GI bleed 01/18/2022  . Acute GI bleeding 01/16/2022  . Anemia due to GI blood loss 01/16/2022  . Subungual hematoma of digit of hand 05/16/2019  . HTN (hypertension) 05/03/2019  . Cocaine use   . Stroke (cerebrum) (HCC) 07/01/2015  . Cerebral ischemia 09/26/2011  . Cerebral infarction (HCC) 03/30/2011  . Dyslipidemia 03/30/2011  . Substance abuse (HCC) 03/30/2011  . Cigarette smoker 03/30/2011    Past Surgical History:  Procedure Laterality Date  . BIOPSY  01/19/2022   Procedure: BIOPSY;  Surgeon: Stacia Glendia BRAVO, MD;  Location: [facility] ENDOSCOPY;  Service: Gastroenterology;;  . COLONOSCOPY WITH PROPOFOL  N/A 01/18/2022   Procedure: COLONOSCOPY WITH PROPOFOL ;  Surgeon: Stacia Glendia BRAVO, MD;  Location[facility] ENDOSCOPY;  Service: Gastroenterology;   Laterality: N/A;  . ESOPHAGOGASTRODUODENOSCOPY N/A 01/19/2022   Procedure: ESOPHAGOGASTRODUODENOSCOPY (EGD);  Surgeon: Stacia Glendia BRAVO, MD;  Location: [facility] ENDOSCOPY;  Service: Gastroenterology;  Laterality: N/A;  . HERNIA REPAIR         Home Medications    Prior to Admission medications   Medication Sig Start Date End Date Taking? Authorizing Provider  amLODipine  (NORVASC ) 10 MG tablet Take 1 tablet (10 mg total) by mouth daily. Patient not taking: Reported on 05/23/2023 09/01/22   Marlee Lynwood NOVAK, MD  aspirin  EC 81 MG tablet Take 1 tablet (81 mg total) by mouth daily. Swallow whole. 05/27/23   de Clint Kill, Cortney E, NP  atorvastatin  (LIPITOR ) 80 MG tablet Take 1 tablet (80 mg total) by mouth daily. Patient not taking: Reported on 05/23/2023 09/01/22   Marlee Lynwood NOVAK, MD  butalbital -acetaminophen -caffeine  (FIORICET ) 50-325-40 MG tablet Take 2 tablets by mouth every 8 (eight) hours as needed for headache. 05/26/23   de Clint Kill, Cortney E, NP  calcium  carbonate (TUMS - DOSED IN MG ELEMENTAL CALCIUM ) 500 MG chewable tablet Chew 2 tablets by mouth as needed for indigestion or heartburn.    [provider]  diclofenac  Sodium (VOLTAREN ) 1 % GEL Apply 4 g topically 4 (four) times daily. Patient not taking: Reported on 05/23/2023 08/31/22   Marlee Lynwood NOVAK, MD  indomethacin  (INDOCIN ) 50 MG capsule Take 1 capsule (50 mg total) by mouth 2 (two) times daily with a meal. Patient not  taking: Reported on 05/23/2023 05/20/23   Kingsley, Victoria K, DO  lidocaine  (LIDODERM ) 5 % Place 1 patch onto the skin daily. Remove & Discard patch within 12 hours or as directed by MD Patient not taking: Reported on 05/23/2023 05/20/23   Kingsley, Victoria K, DO  nicotine  (NICODERM CQ  - DOSED IN MG/24 HOURS) 21 mg/24hr patch Place 1 patch (21 mg total) onto the skin daily. 05/27/23   de Clint Kill, Cortney E, NP  pantoprazole  (PROTONIX ) 40 MG tablet Take 1 tablet (40 mg total) by mouth daily. 05/27/23   de Clint Kill Earle FORBES, NP    Family History Family History  Problem Relation Age of Onset  . Stroke Father   . Heart disease Father 94       CABG    Social History Social History   Tobacco Use  . Smoking status: Every Day    Current packs/day: 0.50    Average packs/day: 0.5 packs/day for 22.5 years (11.2 ttl pk-yrs)    Types: Cigarettes    Start date: 07/04/2001  . Smokeless tobacco: Never  Vaping Use  . Vaping status: Never Used  Substance Use Topics  . Alcohol use: Yes    Alcohol/week: 1.0 standard drink of alcohol    Types: 1 Cans of beer per week    Comment: occ  . Drug use: Yes    Types: Marijuana, Cocaine    Comment: relapsed 1 week ago     Allergies   Bee venom and Codeine   Review of Systems Review of Systems  Respiratory:  Positive for shortness of breath.   Cardiovascular:  Positive for chest pain.     Physical Exam Updated Vital Signs BP (!) 171/95   Pulse 81   Temp (!) 97.5 F (36.4 C)   Resp 18   SpO2 96%   Physical Exam   ED Treatments / Results  Labs (all labs ordered are listed, but only abnormal results are displayed) Labs Reviewed  CBC WITH DIFFERENTIAL/PLATELET  COMPREHENSIVE METABOLIC PANEL WITH GFR  LIPASE, BLOOD  BRAIN NATRIURETIC PEPTIDE  TROPONIN I (HIGH SENSITIVITY)    EKG  Radiology No results found.  Procedures Procedures (including critical care time)  Medications Ordered in ED Medications  alum & mag hydroxide-simeth (MAALOX/MYLANTA) 200-200-20 MG/5ML suspension 30 mL (has no administration in time range)     Initial Impression / Assessment and Plan / ED Course  I have reviewed the triage vital signs and the nursing notes.  Pertinent labs & imaging results that were available during my care of the patient were reviewed by me and considered in my medical decision making (see chart for details).     ***  Final Clinical Impressions(s) / ED Diagnoses   Final diagnoses:  None    New Prescriptions New  Prescriptions   No medications on file

## 2023-12-26 NOTE — Discharge Instructions (Addendum)
 Your workup today was overall reassuring.  Your heart test looked negative but as discussed I recommend that you stop using cocaine as this can cause serious and life-threatening heart problems.  I suspect that you sprained your rib, take ibuprofen  up to 3 times a day as needed for pain.  You can also take the muscle relaxer called Robaxin , however it can make you sleepy and do not drink or drive or operate heavy machinery and take at night.  Please make sure to follow-up with a GI doctor regarding the blood in your stool.  Please take Protonix  (pantoprazole ) twice a day consistently for your stomach ulcer.  I have sent you a 30-day prescription but you can also find this over-the-counter.  Please call the phone number for the GI doctor provided and schedule an appointment.  Please also see the phone number in your discharge paperwork to schedule an appointment with your primary care doctor.  Please return to the emergency department if you have any new or worsening of your symptoms.

## 2023-12-26 NOTE — ED Provider Notes (Signed)
 [place] EMERGENCY DEPARTMENT AT [facility] Provider Note   CSN: [id number] Arrival date & time: 12/26/23  9171     Patient presents with: Chest Pain and Shortness of Breath   Derek French is a 58 y.o. male.   HPI 58 year old male with a history of hypertension, cocaine use, prior cerebral infarction, duodenal ulcer, tobacco use, lower GI bleed who presented to the emergency department with complaints of chest pain which started yesterday.  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  He thought this may be gas and took some Gas-X but this did not relieve his pain.  He states pain is worsened with laying back, and has worsening shortness of breath with laying back, improved with sitting forward.  He describes the pain as pleuritic, difficult to catch his breath.  He explains pain is feeling someone is sitting on my chest.  He also endorses some left-sided rib pain that is worsened with movement.  Denies any falls, though does state that he has been helping his brother in the garden recently and doing some manual labor.  He denies any lower extremity edema.  He denies any history of heart failure.  Endorses last cocaine use on Thursday, states he smokes it.  He denies any dizziness or near syncope.   Upon further questioning, patient endorses that he had has had some intermittent dark stools.  He has a history of duodenal ulcer which he is aware of, and has not been taking his PPI.  He states he has been ignoring it.  He denies any brisk bleeding.  He has seen GI in the past but it has been quite sometime and he cannot remember who he saw.    Prior to Admission medications   Medication Sig Start Date End Date Taking? Authorizing Provider  ibuprofen  (ADVIL ) 800 MG tablet Take 1 tablet (800 mg total) by mouth 3 (three) times daily. 12/26/23  Yes Vonn Hadassah LABOR, PA-C  methocarbamol  (ROBAXIN ) 500 MG  tablet Take 1 tablet (500 mg total) by mouth 2 (two) times daily. 12/26/23  Yes Vonn Hadassah LABOR, PA-C  pantoprazole  (PROTONIX ) 20 MG tablet Take 1 tablet (20 mg total) by mouth daily. 12/26/23  Yes Vonn Hadassah LABOR, PA-C  amLODipine  (NORVASC ) 10 MG tablet Take 1 tablet (10 mg total) by mouth daily. Patient not taking: Reported on 05/23/2023 09/01/22   Marlee Lynwood NOVAK, MD  aspirin  EC 81 MG tablet Take 1 tablet (81 mg total) by mouth daily. Swallow whole. 05/27/23   de Clint Kill, Cortney E, NP  atorvastatin  (LIPITOR ) 80 MG tablet Take 1 tablet (80 mg total) by mouth daily. Patient not taking: Reported on 05/23/2023 09/01/22   Marlee Lynwood NOVAK, MD  butalbital -acetaminophen -caffeine  (FIORICET ) 50-325-40 MG tablet Take 2 tablets by mouth every 8 (eight) hours as needed for headache. 05/26/23   de Clint Kill, Cortney E, NP  calcium  carbonate (TUMS - DOSED IN MG ELEMENTAL CALCIUM ) 500 MG chewable tablet Chew 2 tablets by mouth as needed for indigestion or heartburn.    [provider]  diclofenac  Sodium (VOLTAREN ) 1 % GEL Apply 4 g topically 4 (four) times daily. Patient not taking: Reported on 05/23/2023 08/31/22   Marlee Lynwood NOVAK, MD  indomethacin  (INDOCIN ) 50 MG capsule Take 1 capsule (50 mg total) by mouth 2 (two) times daily with a meal. Patient not taking: Reported on 05/23/2023 05/20/23   Kingsley, Victoria K,  DO  lidocaine  (LIDODERM ) 5 % Place 1 patch onto the skin daily. Remove & Discard patch within 12 hours or as directed by MD Patient not taking: Reported on 05/23/2023 05/20/23   Kingsley, Victoria K, DO  nicotine  (NICODERM CQ  - DOSED IN MG/24 HOURS) 21 mg/24hr patch Place 1 patch (21 mg total) onto the skin daily. 05/27/23   de Clint Kill, Cortney E, NP    Allergies: Bee venom and Codeine    Review of Systems Ten systems reviewed and are negative for acute change, except as noted in the HPI.   Updated Vital Signs BP (!) 171/95   Pulse (!) 56   Temp (!) 97.5 F (36.4 C)   Resp 18   SpO2 98%    Physical Exam Vitals reviewed.  Constitutional:      Appearance: Normal appearance.  HENT:     Head: Normocephalic.  Cardiovascular:     Rate and Rhythm: Normal rate and regular rhythm.     Pulses: Normal pulses.     Heart sounds: Normal heart sounds, S1 normal and S2 normal. No murmur heard.    No friction rub.  Pulmonary:     Effort: Pulmonary effort is normal.     Breath sounds: Normal breath sounds.  Chest:       Comments: Reproducible lateral left rib cage tenderness but no crepitus Abdominal:     General: Abdomen is flat.     Palpations: Abdomen is soft.  Musculoskeletal:     Right lower leg: No edema.     Left lower leg: No edema.     Comments: No lower extremity edema  Neurological:     Mental Status: He is alert.     (all labs ordered are listed, but only abnormal results are displayed) Labs Reviewed  COMPREHENSIVE METABOLIC PANEL WITH GFR - Abnormal; Notable for the following components:      Result Value   CO2 20 (*)    Glucose, Bld 152 (*)    Calcium  8.6 (*)    Total Protein 6.1 (*)    Albumin 3.2 (*)    All other components within normal limits  CBC WITH DIFFERENTIAL/PLATELET  LIPASE, BLOOD  BRAIN NATRIURETIC PEPTIDE  TROPONIN I (HIGH SENSITIVITY)  TROPONIN I (HIGH SENSITIVITY)    EKG: EKG Interpretation Date/Time:  Monday December 26 2023 08:40:16 EST Ventricular Rate:  67 PR Interval:  171 QRS Duration:  90 QT Interval:  393 QTC Calculation: 415 R Axis:   60  Text Interpretation: Sinus rhythm  diffuse ST/T changes similar to Mar 2025 Confirmed by Freddi Hamilton [phone]) on 12/26/2023 8:43:58 AM  Radiology: DG Chest Portable 1 View Result Date: 12/26/2023 EXAM: 1 VIEW(S) XRAY OF THE CHEST 12/26/2023 08:56:00 AM COMPARISON: 05/20/2023 CLINICAL HISTORY: cp FINDINGS: LUNGS AND PLEURA: No focal pulmonary opacity. No pulmonary edema. No pleural effusion. No pneumothorax. HEART AND MEDIASTINUM: No acute abnormality of the cardiac and mediastinal  silhouettes. BONES AND SOFT TISSUES: No acute osseous abnormality. IMPRESSION: 1. No acute process. Electronically signed by: Evalene Coho MD 12/26/2023 09:16 AM EST RP Workstation: [id number]     Procedures   Medications Ordered in the ED  lidocaine  (LIDODERM ) 5 % 1 patch (1 patch Transdermal Patch Applied 12/26/23 1012)  alum & mag hydroxide-simeth (MAALOX/MYLANTA) 200-200-20 MG/5ML suspension 30 mL (30 mLs Oral Given 12/26/23 0904)  methocarbamol  (ROBAXIN ) injection 500 mg (500 mg Intramuscular Given 12/26/23 0912)  ketorolac  (TORADOL ) 15 MG/ML injection 15 mg (15 mg Intramuscular Given 12/26/23 0914)  Medical Decision Making Amount and/or Complexity of Data Reviewed Labs: ordered. Radiology: ordered.  Risk OTC drugs. Prescription drug management.   This patient presents to the ED for concern of chest pain, this involves an extensive number of treatment options, and is a complaint that carries with it a high risk of complications and morbidity.  The differential diagnosis includes ACS, PE, GERD, muscle strain, dissection, pneumonia, gastroenteritis, pneumothorax, pericarditis   Co morbidities that complicate the patient evaluation  Cocaine use, duodenal ulcer, history of GI bleed   Additional history obtained:  Additional history obtained from chart review and patient External records from outside source obtained and reviewed   Lab Tests:  I Ordered, and personally interpreted labs.  The pertinent results include:   CMP with a glucose of 152, normal LFTs and T. bili, CBC with normal hemoglobin of 14.1 and no leukocytosis.  Lipase is normal.  Delta troponin is negative.  BNP is normal.  Imaging Studies ordered:  I ordered imaging studies including  chest xray  I independently visualized and interpreted imaging which showed chest x-ray without acute pneumothorax, pneumonia, rib fractures, mediastinal widening.   I agree with the  radiologist interpretation   Cardiac Monitoring:  The patient was maintained on a cardiac monitor.  I personally viewed and interpreted the cardiac monitored which showed an underlying rhythm of: normal sinus rhythm   Medicines ordered and prescription drug management:  I ordered medication including Toradol , Robaxin , Maalox   Reevaluation of the patient after these medicines showed that the patient improved I have reviewed the patients home medicines and have made adjustments as needed   Test Considered:  CT chest    Critical Interventions:  None   Consultations Obtained:  No inpatient consultation indicated    Problem List / ED Course:   -Left-sided rib strain -Melanotic stools  58 year old male presenting with chest pain and left-sided rib pain.  Does endorse some manual labor at work.  He did endorse some associated shortness of breath but though this seemed more consistent with pain on inspiration.  Workup overall was unremarkable, troponins were negative, EKG not consistent with ACS, BNP is normal.  Chest x-ray did not show any large effusion, pneumothorax, no obvious rib fractures.  He has reproducible left-sided muscular tenderness between the ribs, no crepitus.  I have low suspicion for PE as he is not tachycardic, tachypneic or hypoxic.  Pain did improved with Toradol , Robaxin  and lidocaine  patch.  I also gave him a dose of Maalox as he does have a history of duodenal ulcer  In regards to his reports of intermittent blood in his stool, his hemoglobin is completely normal at 14.1.  He refused a rectal exam and I think it is reasonable as this would not change our treatment.  I prescribed him a PPI twice daily and encouraged that he take this consistently for the foreseeable future until he is seen by GI.  I will refer him back to [facility] as this seems to be where he was referred to the past.  Encouraged him to return to the emergency department if he develops brisk  bleeding, dizziness, or any new or concerning symptoms.  He voiced understanding and is agreeable.  I also stressed follow-up with his PCP and cessation of cocaine use.   Reevaluation:  After the interventions noted above, I reevaluated the patient and found that they have :improved   Social Determinants of Health:  Housing instability    Dispostion:  After consideration of  the diagnostic results and the patients response to treatment, I feel that the patent would benefit from discharge home with GI follow-up and PCP follow-up.       Final diagnoses:  Sprain of costal cartilage, initial encounter  Melena    ED Discharge Orders          Ordered    methocarbamol  (ROBAXIN ) 500 MG tablet  2 times daily        12/26/23 1308    ibuprofen  (ADVIL ) 800 MG tablet  3 times daily        12/26/23 1308    omeprazole (PRILOSEC) 20 MG capsule  2 times daily before meals,   Status:  Discontinued        12/26/23 1310    pantoprazole  (PROTONIX ) 20 MG tablet  Daily        12/26/23 1328               Vonn Hadassah DELENA DEVONNA 12/26/23 1330    Freddi Hamilton, MD 12/29/23 [phone]

## 2024-01-27 ENCOUNTER — Emergency Department (HOSPITAL_COMMUNITY)
Admission: EM | Admit: 2024-01-27 | Discharge: 2024-01-27 | Disposition: A | Discharge status: Home / Self Care | Attending: Emergency Medicine | Admitting: Emergency Medicine

## 2024-01-27 ENCOUNTER — Emergency Department (HOSPITAL_COMMUNITY)

## 2024-01-27 ENCOUNTER — Other Ambulatory Visit: Payer: Self-pay

## 2024-01-27 DIAGNOSIS — R4701 Aphasia: Secondary | ICD-10-CM

## 2024-01-27 DIAGNOSIS — R29898 Other symptoms and signs involving the musculoskeletal system: Secondary | ICD-10-CM

## 2024-01-27 LAB — BASIC METABOLIC PANEL WITH GFR
Anion gap: 8 (ref 5–15)
BUN: 13 mg/dL (ref 6–20)
CO2: 22 mmol/L (ref 22–32)
Calcium: 8.8 mg/dL — ABNORMAL LOW (ref 8.9–10.3)
Chloride: 110 mmol/L (ref 98–111)
Creatinine, Ser: 1 mg/dL (ref 0.61–1.24)
GFR, Estimated: >60 mL/min (ref >60)
Glucose, Bld: 99 mg/dL (ref 70–99)
Potassium: 4.3 mmol/L (ref 3.5–5.1)
Sodium: 140 mmol/L (ref 135–145)

## 2024-01-27 LAB — APTT: aPTT: 36 s (ref 24–36)

## 2024-01-27 LAB — CBC
HCT: 45.1 % (ref 39.0–52.0)
Hemoglobin: 15.7 g/dL (ref 13.0–17.0)
MCH: 32.4 pg (ref 26.0–34.0)
MCHC: 34.8 g/dL (ref 30.0–36.0)
MCV: 93.2 fL (ref 80.0–100.0)
Platelets: 315 K/uL (ref 150–400)
RBC: 4.84 MIL/uL (ref 4.22–5.81)
RDW: 13.2 % (ref 11.5–15.5)
WBC: 8 K/uL (ref 4.0–10.5)
nRBC: 0 % (ref 0.0–0.2)

## 2024-01-27 LAB — HEPATIC FUNCTION PANEL
ALT: 13 U/L (ref 0–44)
AST: 15 U/L (ref 15–41)
Albumin: 3.3 g/dL — ABNORMAL LOW (ref 3.5–5.0)
Alkaline Phosphatase: 45 U/L (ref 38–126)
Bilirubin, Direct: 0.1 mg/dL (ref 0.0–0.2)
Indirect Bilirubin: 0.8 mg/dL (ref 0.3–0.9)
Total Bilirubin: 0.9 mg/dL (ref 0.0–1.2)
Total Protein: 6.5 g/dL (ref 6.5–8.1)

## 2024-01-27 LAB — DIFFERENTIAL
Abs Immature Granulocytes: 0.02 K/uL (ref 0.00–0.07)
Basophils Absolute: 0 K/uL (ref 0.0–0.1)
Basophils Relative: 0 %
Eosinophils Absolute: 0.1 K/uL (ref 0.0–0.5)
Eosinophils Relative: 1 %
Immature Granulocytes: 0 %
Lymphocytes Relative: 29 %
Lymphs Abs: 2.3 K/uL (ref 0.7–4.0)
Monocytes Absolute: 0.9 K/uL (ref 0.1–1.0)
Monocytes Relative: 11 %
Neutro Abs: 4.6 K/uL (ref 1.7–7.7)
Neutrophils Relative %: 59 %

## 2024-01-27 LAB — I-STAT CHEM 8, ED
BUN: 20 mg/dL (ref 6–20)
Calcium, Ion: 0.89 mmol/L — CL (ref 1.15–1.40)
Chloride: 111 mmol/L (ref 98–111)
Creatinine, Ser: 1.1 mg/dL (ref 0.61–1.24)
Glucose, Bld: 94 mg/dL (ref 70–99)
HCT: 47 % (ref 39.0–52.0)
Hemoglobin: 16 g/dL (ref 13.0–17.0)
Potassium: 7.1 mmol/L (ref 3.5–5.1)
Sodium: 137 mmol/L (ref 135–145)
TCO2: 22 mmol/L (ref 22–32)

## 2024-01-27 LAB — PROTIME-INR
INR: 1.1 (ref 0.8–1.2)
Prothrombin Time: 14.3 s (ref 11.4–15.2)

## 2024-01-27 LAB — ETHANOL: Alcohol, Ethyl (B): <15 mg/dL (ref <15)

## 2024-01-27 LAB — CBG MONITORING, ED: Glucose-Capillary: 116 mg/dL — ABNORMAL HIGH (ref 70–99)

## 2024-01-27 MED ORDER — NALOXONE HCL 0.4 MG/ML IJ SOLN
0.4000 mg | Freq: Once | INTRAMUSCULAR | Status: AC
  Administered 2024-01-27: 0.4 mg via INTRAVENOUS

## 2024-01-27 MED ORDER — HYDROMORPHONE HCL 1 MG/ML IJ SOLN
1.0000 mg | Freq: Once | INTRAMUSCULAR | Status: AC
  Administered 2024-01-27: 1 mg via INTRAVENOUS
  Filled 2024-01-27: qty 1

## 2024-01-27 MED ORDER — SODIUM CHLORIDE 0.9% FLUSH
3.0000 mL | Freq: Once | INTRAVENOUS | Status: AC
  Administered 2024-01-27: 3 mL via INTRAVENOUS

## 2024-01-27 NOTE — Progress Notes (Signed)
   01/27/24 1542  TOC Brief Assessment  Insurance and Status Reviewed  Patient has primary care physician Yes  Home environment has been reviewed From home alone  Prior level of function: Independent  Prior/Current Home Services No current home services  Social Drivers of Health Review SDOH reviewed interventions complete  Readmission risk has been reviewed Yes  Transition of care needs no transition of care needs at this time   Pt currently lives at the [address] on [place], [place], [phone]. No DME, no current services. Pt works at [employer], uses [employer] for transportation and plans to return to work. PT recommended a single-point cane and HHPT. CM delivered cane from [facility] stock to bedside. Pt is not home-bound, therefore does not qualify for home health PT. Pt, ED staff updated.

## 2024-01-27 NOTE — Consult Note (Addendum)
 NEUROLOGY CONSULT NOTE   Date of service: January 27, 2024 Patient Name: Derek French MRN:  [id number] DOB:  [birth year] Chief Complaint: right sided weakness, right sided numbness and aphasia Requesting Provider: Pamella Ozell LABOR, DO  History of Present Illness  Derek French is a 58 y.o. male with hx of HTN, CKD, GI bleeding, multiple presentations with strokelike symptoms and negative MRI, HLD, smoking and polysubstance abuse who presents with right sided weakness, numbness, tingling and difficulty speaking.  He states that he woke up with these symptoms but is unable to state when he went to bed last night.  Patient was taken for STAT MRI to determine if symptoms are due to stroke.  LKW: 12/4 evening Modified rankin score: 0-Completely asymptomatic and back to baseline post- stroke IV Thrombolysis: not offered, no stropke on MRI braion EVT: not offered, no stroke.  NIHSS components Score: Comment  1a Level of Conscious 0[x]  1[]  2[]  3[]      1b LOC Questions 0[]  1[]  2[x]       1c LOC Commands 0[x]  1[]  2[]       2 Best Gaze 0[x]  1[]  2[]       3 Visual 0[x]  1[]  2[]  3[]      4 Facial Palsy 0[]  1[]  2[]  3[]      5a Motor Arm - left 0[x]  1[]  2[]  3[]  4[]  UN[]    5b Motor Arm - Right 0[]  1[]  2[]  3[x]  4[]  UN[]    6a Motor Leg - Left 0[x]  1[]  2[]  3[]  4[]  UN[]    6b Motor Leg - Right 0[]  1[]  2[]  3[x]  4[]  UN[]    7 Limb Ataxia 0[]  1[x]  2[]  UN[]      8 Sensory 0[]  1[x]  2[]  UN[]      9 Best Language 0[]  1[x]  2[]  3[]      10 Dysarthria 0[]  1[x]  2[]  UN[]      11 Extinct. and Inattention 0[x]  1[]  2[]       TOTAL: 12      ROS   Unable to ascertain due to speech disturbance  Past History   Past Medical History:  Diagnosis Date   Anemia due to GI blood loss 01/16/2022   Chest pain 08/30/2022   Encounter for hepatitis C screening test for low risk patient 01/25/2022   Hypertension 05/03/2019   Recurrent boils    Stroke-like episode    07/2009, 03/2011, 09/2011    Past Surgical History:  Procedure  Laterality Date   BIOPSY  01/19/2022   Procedure: BIOPSY;  Surgeon: Stacia Glendia BRAVO, MD;  Location: [facility] ENDOSCOPY;  Service: Gastroenterology;;   COLONOSCOPY WITH PROPOFOL  N/A 01/18/2022   Procedure: COLONOSCOPY WITH PROPOFOL ;  Surgeon: Stacia Glendia BRAVO, MD;  Location: [facility] ENDOSCOPY;  Service: Gastroenterology;  Laterality: N/A;   ESOPHAGOGASTRODUODENOSCOPY N/A 01/19/2022   Procedure: ESOPHAGOGASTRODUODENOSCOPY (EGD);  Surgeon: Stacia Glendia BRAVO, MD;  Location: [facility] ENDOSCOPY;  Service: Gastroenterology;  Laterality: N/A;   HERNIA REPAIR      Family History: Family History  Problem Relation Age of Onset   Stroke Father    Heart disease Father 40       CABG    Social History  reports that he has been smoking cigarettes. He started smoking about 22 years ago. He has a 11.3 pack-year smoking history. He has never used smokeless tobacco. He reports current alcohol use of about 1.0 standard drink of alcohol per week. He reports current drug use. Drugs: Marijuana and Cocaine.  Allergies  Allergen Reactions   Bee Venom Anaphylaxis   Codeine Other (See Comments)  hallucinations     Medications   Current Facility-Administered Medications:    HYDROmorphone  (DILAUDID ) injection 1 mg, 1 mg, Intravenous, Once, Derek Rothschild, MD   sodium chloride  flush (NS) 0.9 % injection 3 mL, 3 mL, Intravenous, Once, Derek Ozell LABOR, DO  Current Outpatient Medications:    amLODipine  (NORVASC ) 10 MG tablet, Take 1 tablet (10 mg total) by mouth daily. (Patient not taking: Reported on 05/23/2023), Disp: 30 tablet, Rfl: 0   aspirin  EC 81 MG tablet, Take 1 tablet (81 mg total) by mouth daily. Swallow whole., Disp: 30 tablet, Rfl: 12   atorvastatin  (LIPITOR ) 80 MG tablet, Take 1 tablet (80 mg total) by mouth daily. (Patient not taking: Reported on 05/23/2023), Disp: 30 tablet, Rfl: 0   butalbital -acetaminophen -caffeine  (FIORICET ) 50-325-40 MG tablet, Take 2 tablets by mouth every 8 (eight) hours as  needed for headache., Disp: 14 tablet, Rfl: 0   calcium  carbonate (TUMS - DOSED IN MG ELEMENTAL CALCIUM ) 500 MG chewable tablet, Chew 2 tablets by mouth as needed for indigestion or heartburn., Disp: , Rfl:    diclofenac  Sodium (VOLTAREN ) 1 % GEL, Apply 4 g topically 4 (four) times daily. (Patient not taking: Reported on 05/23/2023), Disp: , Rfl:    ibuprofen  (ADVIL ) 800 MG tablet, Take 1 tablet (800 mg total) by mouth 3 (three) times daily., Disp: 21 tablet, Rfl: 0   indomethacin  (INDOCIN ) 50 MG capsule, Take 1 capsule (50 mg total) by mouth 2 (two) times daily with a meal. (Patient not taking: Reported on 05/23/2023), Disp: 30 capsule, Rfl: 0   lidocaine  (LIDODERM ) 5 %, Place 1 patch onto the skin daily. Remove & Discard patch within 12 hours or as directed by MD (Patient not taking: Reported on 05/23/2023), Disp: 30 patch, Rfl: 0   methocarbamol  (ROBAXIN ) 500 MG tablet, Take 1 tablet (500 mg total) by mouth 2 (two) times daily., Disp: 20 tablet, Rfl: 0   nicotine  (NICODERM CQ  - DOSED IN MG/24 HOURS) 21 mg/24hr patch, Place 1 patch (21 mg total) onto the skin daily., Disp: 28 patch, Rfl: 0   pantoprazole  (PROTONIX ) 20 MG tablet, Take 1 tablet (20 mg total) by mouth daily., Disp: 90 tablet, Rfl: 1  Vitals   Vitals:   01/27/24 0823  BP: (!) 146/103  Pulse: 95  Resp: 18  Temp: 97.8 F (36.6 C)  TempSrc: Oral  SpO2: 100%    There is no height or weight on file to calculate BMI.   Physical Exam   General: Laying comfortably in bed; in no acute distress.  HENT: Normal oropharynx and mucosa. Normal external appearance of ears and nose.  Neck: Supple, no pain or tenderness  CV: No JVD. No peripheral edema.  Pulmonary: Symmetric Chest rise. Tachypneic with deep breathing Abdomen: Soft to touch, non-tender.  Ext: No cyanosis, edema, or deformity  Skin: No rash. Normal palpation of skin.   Musculoskeletal: Normal digits and nails by inspection. No clubbing.   Neurologic Examination  Mental  status/Cognition: awake, oriented to self,, limited speech output Speech/language: dysarthric, non fluent, comprehension intact to simple commands. Cranial nerves:   CN II Pupils equal and reactive to light, no VF deficits    CN III,IV,VI EOM intact, no gaze preference or deviation, no nystagmus    CN V normal sensation in V1, V2, and V3 segments bilaterally    CN VII no asymmetry, no nasolabial fold flattening    CN VIII normal hearing to speech    CN IX & X normal palatal elevation, no  uvular deviation    CN XI 5/5 head turn and 5/5 shoulder shrug bilaterally    CN XII midline tongue protrusion    Motor:  Muscle bulk: poor, tone normal Mvmt Root Nerve  Muscle Right Left Comments  SA C5/6 Ax Deltoid 3 5   EF C5/6 Mc Biceps     EE C6/7/8 Rad Triceps     WF C6/7 Med FCR     WE C7/8 PIN ECU     F Ab C8/T1 U ADM/FDI 3 5   HF L1/2/3 Fem Illopsoas 3 5   KE L2/3/4 Fem Quad     DF L4/5 D Peron Tib Ant     PF S1/2 Tibial Grc/Sol      Sensation:  Light touch Decreased in R arm, L and face.   Pin prick    Temperature    Vibration   Proprioception    Coordination/Complex Motor:  - Finger to Nose intact on the left, ataxic in RUE. - Heel to shin unable to assess. - Rapid alternating movement are intact on the right - Gait: deferred.  Labs/Imaging/Neurodiagnostic studies   CBC: No results for input(s): WBC, NEUTROABS, HGB, HCT, MCV, PLT in the last 168 hours. Basic Metabolic Panel:  Lab Results  Component Value Date   NA 143 12/26/2023   K 3.7 12/26/2023   CO2 20 (L) 12/26/2023   GLUCOSE 152 (H) 12/26/2023   BUN 12 12/26/2023   CREATININE 1.19 12/26/2023   CALCIUM  8.6 (L) 12/26/2023   GFRNONAA >60 12/26/2023   GFRAA 85 05/02/2019   Lipid Panel:  Lab Results  Component Value Date   LDLCALC 132 (H) 05/24/2023   HgbA1c:  Lab Results  Component Value Date   HGBA1C 5.3 05/23/2023   Urine Drug Screen:     Component Value Date/Time   LABOPIA NONE DETECTED  05/23/2023 2355   COCAINSCRNUR NONE DETECTED 05/23/2023 2355   LABBENZ NONE DETECTED 05/23/2023 2355   AMPHETMU NONE DETECTED 05/23/2023 2355   THCU NONE DETECTED 05/23/2023 2355   LABBARB NONE DETECTED 05/23/2023 2355    Alcohol Level     Component Value Date/Time   ETH <10 05/23/2023 1939   INR  Lab Results  Component Value Date   INR 1.0 05/23/2023   APTT  Lab Results  Component Value Date   APTT 30 05/23/2023    MRI Brain(Personally reviewed): No acute stroke noted on Diffusion imaging.   ASSESSMENT   AYODELE SANGALANG is a 58 y.o. male with hx of HTN, CKD, GI bleeding, multiple presentations with strokelike symptoms and negative MRI, HLD, smoking and polysubstance abuse who presents with right sided weakness, right sided numbness and tingling and speech disturbance.   He has had multiple prior similar presentations with multiple prior negative MRI brain. Therefore, tnkase  was not offered and instead, tatient was taken to STAT MRI to determine if symptoms were caused by stroke.  MRI brain negative for stroke.  High suspicion for malingering. No further inpatient neurologic workup.  Prior MRIs and Neuro workup: 05/2023 admitted with R sided weakness, cocaine positive, MRI Brain negative. 06/2015 admitted with right HP and right field cut, s/p tPA, cocaine positive. MRI negative 09/2011 admitted with R HP s/p IV tPA. MRI neg - cocaine positive, marijuana positive 03/2011 admitted with R HP and R field cut. MRI neg. cocaine positive and marijuana positive 07/2009 treated for stroke at [facility]. MRI neg. Cocaine positive  RECOMMENDATIONS  - no further inpatient neurologic workup. If the deficits do  not improve spontaneously, recommend PT and OT. - neurology will signoff. ______________________________________________________________________  Plan discussed with Dr. Pamella and Derek French with the Derek French.  Signed, Derek French Derek Kill, NP [facility]     NEUROHOSPITALIST ADDENDUM Performed a face to face diagnostic evaluation.   I have reviewed the contents of history and physical exam as documented by PA/ARNP/Resident and agree with above documentation.  I have discussed and formulated the above plan as documented. Edits to the note have been made as needed.  Derek Demo, MD [facility] [phone]   If 7pm to 7am, please call on call as listed on AMION.

## 2024-01-27 NOTE — Discharge Instructions (Addendum)
 I have given you the information for a neurologist to follow-up with.

## 2024-01-27 NOTE — Code Documentation (Signed)
 Stroke Response Nurse Documentation Code Documentation  Derek French is a 58 y.o. male arriving to [place]  via Private Vehicle on POV with past medical hx of HTN, CKD, GI bleeding, HLD, smoking, and polysubstance abuse. On No antithrombotic. Code stroke was activated by ED.   Patient from home with an unknown last known well and now complaining of right sided numbness. Per patient, he went to bed in his normal state and woke up with right sided weakness, numbness, and difficulty speaking. He states he woke up with these symptoms but is unable to give a time as to when he went to bed.   Stroke team at the bedside on patient arrival. Labs drawn and patient cleared for CT by EDP. Patient to CT with team. NIHSS 12, see documentation for details and code stroke times. Patient with disoriented, right arm weakness, right leg weakness, right limb ataxia, right decreased sensation, Expressive aphasia , and dysarthria  on exam. The following imaging was completed:  MRI. Patient is not a candidate for IV Thrombolytic due to not a stroke per MD. Patient is not a candidate for IR due to no LVO noted on imaging per MD.   Care Plan: Code Stroke Canceled per MD  Bedside handoff with ED RN Mitzie.    Derek French  Stroke Response RN

## 2024-01-27 NOTE — ED Provider Notes (Cosign Needed Addendum)
 [place] EMERGENCY DEPARTMENT AT [facility] Provider Note   CSN: [id number] Arrival date & time: 01/27/24  9183  An emergency department physician performed an initial assessment on this suspected stroke patient at 0825.  Patient presents with: Code Stroke   Derek French is a 58 y.o. male.   HPI  Patient is a 58 year old male with a past medical history significant for hypertension, CKD, GI bleeds, multiple strokelike presentations to the emergency room with negative MRIs, polysubstance abuse  Patient presents emergency room today with complaints of right sided weakness numbness tingling and expressive aphasia.  Initially saw the patient at [facility] and immediately called code stroke due to dense right-sided deficits.  Transported to CT where neurology intercepted us  and proceeded to emergent MRI.  Seems that his last known well was last night before he went to bed however we are uncertain of this time.     Prior to Admission medications   Medication Sig Start Date End Date Taking? Authorizing Provider  amLODipine  (NORVASC ) 10 MG tablet Take 1 tablet (10 mg total) by mouth daily. Patient not taking: Reported on 05/23/2023 09/01/22   Marlee Lynwood NOVAK, MD  aspirin  EC 81 MG tablet Take 1 tablet (81 mg total) by mouth daily. Swallow whole. 05/27/23   de Clint Kill, Cortney E, NP  atorvastatin  (LIPITOR ) 80 MG tablet Take 1 tablet (80 mg total) by mouth daily. Patient not taking: Reported on 05/23/2023 09/01/22   Marlee Lynwood NOVAK, MD  butalbital -acetaminophen -caffeine  (FIORICET ) 50-325-40 MG tablet Take 2 tablets by mouth every 8 (eight) hours as needed for headache. 05/26/23   de Clint Kill, Cortney E, NP  calcium  carbonate (TUMS - DOSED IN MG ELEMENTAL CALCIUM ) 500 MG chewable tablet Chew 2 tablets by mouth as needed for indigestion or heartburn.    [provider]  diclofenac  Sodium (VOLTAREN ) 1 % GEL Apply 4 g topically 4 (four) times daily. Patient not taking: Reported on  05/23/2023 08/31/22   Marlee Lynwood NOVAK, MD  ibuprofen  (ADVIL ) 800 MG tablet Take 1 tablet (800 mg total) by mouth 3 (three) times daily. 12/26/23   Vonn Hadassah LABOR, PA-C  indomethacin  (INDOCIN ) 50 MG capsule Take 1 capsule (50 mg total) by mouth 2 (two) times daily with a meal. Patient not taking: Reported on 05/23/2023 05/20/23   Kingsley, Victoria K, DO  lidocaine  (LIDODERM ) 5 % Place 1 patch onto the skin daily. Remove & Discard patch within 12 hours or as directed by MD Patient not taking: Reported on 05/23/2023 05/20/23   Kingsley, Victoria K, DO  methocarbamol  (ROBAXIN ) 500 MG tablet Take 1 tablet (500 mg total) by mouth 2 (two) times daily. 12/26/23   Vonn Hadassah LABOR, PA-C  nicotine  (NICODERM CQ  - DOSED IN MG/24 HOURS) 21 mg/24hr patch Place 1 patch (21 mg total) onto the skin daily. 05/27/23   de Clint Kill, Cortney E, NP  pantoprazole  (PROTONIX ) 20 MG tablet Take 1 tablet (20 mg total) by mouth daily. 12/26/23   Vonn Hadassah LABOR, PA-C    Allergies: Bee venom and Codeine    Review of Systems  Updated Vital Signs BP (!) 159/99   Pulse 78   Temp 97.7 F (36.5 C) (Oral)   Resp 20   SpO2 100%   Physical Exam Vitals and nursing note reviewed.  Constitutional:      General: He is not in acute distress. HENT:     Head: Normocephalic and atraumatic.     Nose: Nose normal.  Eyes:  General: No scleral icterus. Cardiovascular:     Rate and Rhythm: Normal rate and regular rhythm.     Pulses: Normal pulses.     Heart sounds: Normal heart sounds.  Pulmonary:     Effort: Pulmonary effort is normal. No respiratory distress.     Breath sounds: No wheezing.  Abdominal:     Palpations: Abdomen is soft.     Tenderness: There is no abdominal tenderness.  Musculoskeletal:     Cervical back: Normal range of motion.     Right lower leg: No edema.     Left lower leg: No edema.  Skin:    General: Skin is warm and dry.     Capillary Refill: Capillary refill takes less than 2 seconds.  Neurological:      Mental Status: He is alert. Mental status is at baseline.     Comments: Does not move right arm or right leg Somewhat bizarre expressions when asked to smile there is facial asymmetry present  Psychiatric:        Mood and Affect: Mood normal.        Behavior: Behavior normal.     (all labs ordered are listed, but only abnormal results are displayed) Labs Reviewed  BASIC METABOLIC PANEL WITH GFR - Abnormal; Notable for the following components:      Result Value   Calcium  8.8 (*)    All other components within normal limits  HEPATIC FUNCTION PANEL - Abnormal; Notable for the following components:   Albumin 3.3 (*)    All other components within normal limits  I-STAT CHEM 8, ED - Abnormal; Notable for the following components:   Potassium 7.1 (*)    Calcium , Ion 0.89 (*)    All other components within normal limits  CBG MONITORING, ED - Abnormal; Notable for the following components:   Glucose-Capillary 116 (*)    All other components within normal limits  CBC  DIFFERENTIAL  ETHANOL  PROTIME-INR  APTT    EKG: EKG Interpretation Date/Time:  Friday January 27 2024 12:36:37 EST Ventricular Rate:  55 PR Interval:  156 QRS Duration:  84 QT Interval:  424 QTC Calculation: 405 R Axis:   11  Text Interpretation: Sinus bradycardia Minimal voltage criteria for LVH, may be normal variant ( Sokolow-Lyon T wave inversion similar to prior ECG of 12/26/2023 Confirmed by Pamella Sharper [phone]) on 01/27/2024 12:41:14 PM  Radiology: MR BRAIN WO CONTRAST Result Date: 01/27/2024 EXAM: MRI BRAIN WITHOUT CONTRAST 01/27/2024 09:18:11 AM TECHNIQUE: Multiplanar multisequence MRI of the head/brain was performed without the administration of intravenous contrast. COMPARISON: MRI Brain 05/24/2023. CLINICAL HISTORY: 58 year old male. Neuro deficit, acute, stroke suspected. Right side weakness, numbness and aphasia. FINDINGS: BRAIN AND VENTRICLES: No acute infarct. No intracranial hemorrhage. No  mass. No midline shift. No hydrocephalus. The sella is unremarkable. Vascular flow voids appear stable. Chronically advanced, patchy periventricular and scattered cerebral white matter T2 and FLAIR hyperintensity is not significantly changed, moderate. Configuration is nonspecific. No cortical encephalomalacia identified. Chronic microhemorrhage in the left corona radiata on series 7 image 62 is stable. No other convincing chronic cerebral blood products. Deep gray nuclei, cerebellum remain normal. Possible tiny chronic lacunar infarct in the right pons is stable on series 8 image 16. ORBITS: No abnormality. SINUSES AND MASTOIDS: No abnormality. BONES AND SOFT TISSUES: Normal marrow signal. Negative visible cervical spine. Grossly normal visible internal auditory structures. No acute soft tissue abnormality. IMPRESSION: 1. No acute intracranial abnormality. 2. Stable chronic white matter  disease, chronic solitary microhemorrhage in the left corona radiata, and possible tiny chronic lacunar infarct in the right pons. Electronically signed by: Helayne Hurst MD 01/27/2024 09:25 AM EST RP Workstation: [id number]     .Critical Care  Performed by: Neldon Hamp RAMAN, PA Authorized by: Neldon Hamp RAMAN, PA   Critical care provider statement:    Critical care time (minutes):  35   Critical care time was exclusive of:  Separately billable procedures and treating other patients and teaching time   Critical care was necessary to treat or prevent imminent or life-threatening deterioration of the following conditions: stroke.   Critical care was time spent personally by me on the following activities:  Development of treatment plan with patient or surrogate, review of old charts, re-evaluation of patient's condition, pulse oximetry, ordering and review of radiographic studies, ordering and review of laboratory studies, ordering and performing treatments and interventions, obtaining history from patient or surrogate,  examination of patient and evaluation of patient's response to treatment   Care discussed with: admitting provider      Medications Ordered in the ED  sodium chloride  flush (NS) 0.9 % injection 3 mL (3 mLs Intravenous Given 01/27/24 0913)  HYDROmorphone  (DILAUDID ) injection 1 mg (1 mg Intravenous Given 01/27/24 0848)  naloxone  (NARCAN ) injection 0.4 mg (0.4 mg Intravenous Given 01/27/24 0913)                                    Medical Decision Making Amount and/or Complexity of Data Reviewed Labs: ordered. ECG/medicine tests: ordered.  Risk Prescription drug management.   This patient presents to the ED for concern of stroke, this involves a number of treatment options, and is a complaint that carries with it a moderate to high risk of complications and morbidity. A differential diagnosis was considered for the patient's symptoms which is discussed below:   Considered stroke, either ischemic or hemorrhagic. Also considered test paralysis, complex migraine, functional neurologic disorder, seizure   Co morbidities: Discussed in HPI   Brief History:  Patient is a 58 year old male with a past medical history significant for hypertension, CKD, GI bleeds, multiple strokelike presentations to the emergency room with negative MRIs, polysubstance abuse  Patient presents emergency room today with complaints of right sided weakness numbness tingling and expressive aphasia.  Initially saw the patient at [facility] and immediately called code stroke due to dense right-sided deficits.  Transported to CT where neurology intercepted us  and proceeded to emergent MRI.  Seems that his last known well was last night before he went to bed however we are uncertain of this time.    EMR reviewed including pt PMHx, past surgical history and past visits to ER.   See HPI for more details   Lab Tests:   I ordered and independently interpreted labs. Labs notable for BMP, CBC, LFTs, coags all  normal  Imaging Studies:  NAD. I personally reviewed all imaging studies and no acute abnormality found. I agree with radiology interpretation. No acute stroke   Cardiac Monitoring:  The patient was maintained on a cardiac monitor.  I personally viewed and interpreted the cardiac monitored which showed an underlying rhythm of: NSR EKG non-ischemic    Medicines ordered:  I provided no medications to this patient  Critical Interventions:     Consults/Attending Physician   I requested consultation with Dr. Aron POUR of neurology,  and discussed lab and imaging findings as  well as pertinent plan - they recommend: MRI if negative DC  I discussed this case with my attending physician who cosigned this note including patient's presenting symptoms, physical exam, and planned diagnostics and interventions. Attending physician stated agreement with plan or made changes to plan which were implemented.   Attending physician assessed patient at bedside.   Reevaluation:  After the interventions noted above I re-evaluated patient and found that they have :improved   Social Determinants of Health:      Problem List / ED Course:  Patient is a 58 year old male currently living in a hotel came in for right-sided arm and leg weakness/paralysis and aphasia.  MRI negative.  Neurology feels that this is not a stroke -- does not require admission and this is most likely functional neurologic disease.  PT OT evaluated patient given his continued functional deficits.  He was able to walk 150 feet with them which is a dramatic improvement from my last exam where patient was unable to stand. On reassessment after 8 hours in the emergency department patient's symptoms have resolved to the extent that he is not walking.  Will discharge home with instructions to follow-up with neurology outpatient.   Dispostion:  After consideration of the diagnostic results and the patients response to treatment, I  feel that the patent would benefit from outpatient neurology follow-up    Final diagnoses:  Aphasia  Arm weakness  Weakness of right lower extremity    ED Discharge Orders          Ordered    Home Health  Status:  Canceled        01/27/24 1557    Face-to-face encounter (required for Medicare/Medicaid patients)  Status:  Canceled       Comments: I Hamp GORMAN Bow certify that this patient is under my care and that I, or a nurse practitioner or physician's assistant working with me, had a face-to-face encounter that meets the physician face-to-face encounter requirements with this patient on 01/27/2024. The encounter with the patient was in whole, or in part for the following medical condition(s) which is the primary reason for home health care (List medical condition): limb weakness resulting in ambulation issues   01/27/24 1557               Bow Hamp GORMAN, PA 01/28/24 0750    Bow Hamp GORMAN, PA 01/28/24 [id number]

## 2024-01-27 NOTE — NC FL2 (Signed)
 [place]  MEDICAID FL2 LEVEL OF CARE FORM     IDENTIFICATION  Patient Name: Derek French Birthdate: 1966/01/28 Sex: male Admission Date (Current Location): 01/27/2024  [facility] and [place] Number:  Producer, Television/film/video and Address:  [facility], [address], [place], [place] [address]      Provider Number: [id number]  Attending Physician Name and Address:  Pamella Ozell LABOR, DO  Relative Name and Phone Number:  Park, Beck (Mother)  [phone] [facility])    Current Level of Care: Hospital Recommended Level of Care: Skilled Nursing Facility Prior Approval Number:    Date Approved/Denied:   PASRR Number: [id number]  Discharge Plan: SNF    Current Diagnoses: Patient Active Problem List   Diagnosis Date Noted   Stroke [facility]) 05/23/2023   Chest pain on breathing 08/31/2022   Pleuritic chest pain 08/30/2022   Tobacco abuse 08/30/2022   Encounter for hepatitis C screening test for low risk patient 01/25/2022   Weight loss 01/25/2022   Duodenal ulcer 01/19/2022   Right lower quadrant abdominal pain 01/18/2022   Lower GI bleed 01/18/2022   Acute GI bleeding 01/16/2022   Anemia due to GI blood loss 01/16/2022   Subungual hematoma of digit of hand 05/16/2019   HTN (hypertension) 05/03/2019   Cocaine use    Stroke (cerebrum) (HCC) 07/01/2015   Cerebral ischemia 09/26/2011   Cerebral infarction (HCC) 03/30/2011   Dyslipidemia 03/30/2011   Substance abuse (HCC) 03/30/2011   Cigarette smoker 03/30/2011    Orientation RESPIRATION BLADDER Height & Weight     Self, Time, Situation, Place  Normal Continent Weight:   Height:     BEHAVIORAL SYMPTOMS/MOOD NEUROLOGICAL BOWEL NUTRITION STATUS      Continent Diet (see dc summary)  AMBULATORY STATUS COMMUNICATION OF NEEDS Skin   Limited Assist Verbally Normal                       Personal Care Assistance Level of Assistance  Bathing, Feeding, Dressing Bathing Assistance: Limited  assistance Feeding assistance: Independent Dressing Assistance: Limited assistance     Functional Limitations Info  Speech, Hearing, Sight Sight Info: Adequate Hearing Info: Adequate Speech Info: Adequate    SPECIAL CARE FACTORS FREQUENCY  PT (By licensed PT), OT (By licensed OT)     PT Frequency: 5x/wk OT Frequency: 5x/wk            Contractures Contractures Info: Not present    Additional Factors Info  Code Status, Allergies Code Status Info: full code Allergies Info: Bee Venom  Codeine           Current Medications (01/27/2024):  This is the current hospital active medication list No current facility-administered medications for this encounter.   Current Outpatient Medications  Medication Sig Dispense Refill   amLODipine  (NORVASC ) 10 MG tablet Take 1 tablet (10 mg total) by mouth daily. (Patient not taking: Reported on 05/23/2023) 30 tablet 0   aspirin  EC 81 MG tablet Take 1 tablet (81 mg total) by mouth daily. Swallow whole. 30 tablet 12   atorvastatin  (LIPITOR ) 80 MG tablet Take 1 tablet (80 mg total) by mouth daily. (Patient not taking: Reported on 05/23/2023) 30 tablet 0   butalbital -acetaminophen -caffeine  (FIORICET ) 50-325-40 MG tablet Take 2 tablets by mouth every 8 (eight) hours as needed for headache. 14 tablet 0   calcium  carbonate (TUMS - DOSED IN MG ELEMENTAL CALCIUM ) 500 MG chewable tablet Chew 2 tablets by mouth as needed for indigestion or  heartburn.     diclofenac  Sodium (VOLTAREN ) 1 % GEL Apply 4 g topically 4 (four) times daily. (Patient not taking: Reported on 05/23/2023)     ibuprofen  (ADVIL ) 800 MG tablet Take 1 tablet (800 mg total) by mouth 3 (three) times daily. 21 tablet 0   indomethacin  (INDOCIN ) 50 MG capsule Take 1 capsule (50 mg total) by mouth 2 (two) times daily with a meal. (Patient not taking: Reported on 05/23/2023) 30 capsule 0   lidocaine  (LIDODERM ) 5 % Place 1 patch onto the skin daily. Remove & Discard patch within 12 hours or as  directed by MD (Patient not taking: Reported on 05/23/2023) 30 patch 0   methocarbamol  (ROBAXIN ) 500 MG tablet Take 1 tablet (500 mg total) by mouth 2 (two) times daily. 20 tablet 0   nicotine  (NICODERM CQ  - DOSED IN MG/24 HOURS) 21 mg/24hr patch Place 1 patch (21 mg total) onto the skin daily. 28 patch 0   pantoprazole  (PROTONIX ) 20 MG tablet Take 1 tablet (20 mg total) by mouth daily. 90 tablet 1     Discharge Medications: Please see discharge summary for a list of discharge medications.  Relevant Imaging Results:  Relevant Lab Results:   Additional Information SSN [id number]  Sheri ONEIDA Sharps, LCSW

## 2024-01-27 NOTE — ED Notes (Signed)
 AVS reviewed. Can use education provided. Patient states he plans to coordinate his own ride home. No questions at this time.

## 2024-01-27 NOTE — ED Notes (Signed)
 Pt alert and oriented at this time. [place] he is feeling better and ready to go home

## 2024-01-27 NOTE — Evaluation (Signed)
 Physical Therapy Evaluation Patient Details Name: Derek French MRN: [id number] DOB: 1965/12/06 Today's Date: 01/27/2024  History of Present Illness  58 y.o. male presents to [facility] hospital on 01/27/2024 with R weakness, numbness and tingling. MRI negative for CVA. PMH stroke due to vasospasm with cocaine abuse, CKD, cancer, dementia, CAD, CHF, MI, COPD, T2DM, hepatitis, fibromyalgia, sickle cell, multiple myeloma.  Clinical Impression  Pt presents to PT with deficits in functional mobility, strength, power, gait, sensation. Pt reports continued tingling in R side. PT notes reduced R foot clearance which pt appears to compensate for with increased hip flexion. PT notes no RLE instability during stance phase, pt often utilizes LUE support of a counter or via PT hand hold to improve stability at this time. Pt reports that in past instances when he has had similar symptoms but negative imaging it typically takes ~1 week for him to feel back to normal. PT recommends discharge home with HHPT and a single point cane.         If plan is discharge home, recommend the following: A little help with bathing/dressing/bathroom;Assistance with cooking/housework;Assist for transportation;Help with stairs or ramp for entrance   Can travel by private vehicle        Equipment Recommendations Cane  Recommendations for Other Services       Functional Status Assessment Patient has had a recent decline in their functional status and demonstrates the ability to make significant improvements in function in a reasonable and predictable amount of time.     Precautions / Restrictions Precautions Precautions: Fall Recall of Precautions/Restrictions: Intact Restrictions Weight Bearing Restrictions Per Provider Order: No      Mobility  Bed Mobility Overal bed mobility: Modified Independent                  Transfers Overall transfer level: Needs assistance Equipment used: None Transfers: Sit to/from  Stand Sit to Stand: Supervision                Ambulation/Gait Ambulation/Gait assistance: Contact guard assist, Supervision (contact guard progressing to close supervision) Gait Distance (Feet): 150 Feet Assistive device: 1 person hand held assist (hand hold or UE support of counter) Gait Pattern/deviations: Step-to pattern, Decreased step length - right, Decreased dorsiflexion - right Gait velocity: reduced Gait velocity interpretation: <1.31 ft/sec, indicative of household ambulator   General Gait Details: PT notes increased R hip flexion to compensate for decreased R dorsiflexion and increased time in swing phase for RLE. No knee instability or buckling noted of RLE. PT notes pt utilizes little pressure through PT hand hold for support when ambulating during this time, no signs of instability are noted despite limited UE support  Stairs            Wheelchair Mobility     Tilt Bed    Modified Rankin (Stroke Patients Only)       Balance Overall balance assessment: Needs assistance Sitting-balance support: No upper extremity supported, Feet supported Sitting balance-Leahy Scale: Good     Standing balance support: No upper extremity supported, During functional activity Standing balance-Leahy Scale: Fair Standing balance comment: pt is able to urinate at toilet and stand at sink without UE support and no significant instability is noted                             Pertinent Vitals/Pain Pain Assessment Pain Assessment: No/denies pain    Home Living Family/patient expects to be  discharged to:: Other (Comment)                   Additional Comments: motel    Prior Function Prior Level of Function : Independent/Modified Independent             Mobility Comments: working as a museum/gallery exhibitions officer, takes Art Gallery [employer] Extremity Assessment: RUE deficits/detail RUE  Deficits / Details: grossly 4-/5 RUE Sensation: decreased light touch    Lower Extremity Assessment Lower Extremity Assessment: RLE deficits/detail RLE Deficits / Details: grossly 4-/5, ROM WFL RLE Sensation: decreased light touch    Cervical / Trunk Assessment Cervical / Trunk Assessment: Normal  Communication   Communication Communication: Impaired Factors Affecting Communication: Difficulty expressing self    Cognition Arousal: Alert Behavior During Therapy: Flat affect   PT - Cognitive impairments: Problem solving                       PT - Cognition Comments: slowed processing Following commands: Intact       Cueing Cueing Techniques: Verbal cues     General Comments General comments (skin integrity, edema, etc.): pt with hypertension noted, systolic BP into 829d after completing ambulation. Pt reports he has not received any of his regular BP meds while in the ED, likely due to concern for potential ischemia upon presentation to the hospital    Exercises     Assessment/Plan    PT Assessment Patient needs continued PT services  PT Problem List Decreased strength;Decreased balance;Decreased activity tolerance;Decreased mobility;Impaired sensation       PT Treatment Interventions DME instruction;Gait training;Functional mobility training;Therapeutic activities;Therapeutic exercise;Balance training;Neuromuscular re-education;Patient/family education    PT Goals (Current goals can be found in the Care Plan section)  Acute Rehab PT Goals Patient Stated Goal: to return to independence, improve strength in R side PT Goal Formulation: With patient Time For Goal Achievement: 02/10/24 Potential to Achieve Goals: Good Additional Goals Additional Goal #1: Pt will score >19/24 on the DGI to indicate a reduced risk for falls    Frequency Min 3X/week     Co-evaluation               AM-PAC PT 6 Clicks Mobility  Outcome Measure Help needed turning  from your back to your side while in a flat bed without using bedrails?: None Help needed moving from lying on your back to sitting on the side of a flat bed without using bedrails?: None Help needed moving to and from a bed to a chair (including a wheelchair)?: A Little Help needed standing up from a chair using your arms (e.g., wheelchair or bedside chair)?: A Little Help needed to walk in hospital room?: A Little Help needed climbing 3-5 steps with a railing? : A Little 6 Click Score: 20    End of Session Equipment Utilized During Treatment: Gait belt Activity Tolerance: Patient tolerated treatment well Patient left: in bed;with call bell/phone within reach Nurse Communication: Mobility status PT Visit Diagnosis: Other abnormalities of gait and mobility (R26.89);Muscle weakness (generalized) (M62.81);Other symptoms and signs involving the nervous system (R29.898)    Time: 8567-8495 PT Time Calculation (min) (ACUTE ONLY): 32 min   Charges:   PT Evaluation $PT Eval Low Complexity: 1 Low   PT General Charges $$ ACUTE PT VISIT: 1 Visit         Bernardino JINNY Ruth, PT, DPT Acute Rehabilitation Office [phone]  Bernardino JINNY Ruth 01/27/2024, 4:15 PM

## 2024-01-27 NOTE — ED Triage Notes (Signed)
 Pt POV said he woke up this morning and didn't feel right. When he went to bed last night he was normal. Right sided weakness, numbness and tingling. Slurred speech. Unclear LKW, no family accompanying patient.

## 2024-01-27 NOTE — Progress Notes (Signed)
 Physical Therapy Quick Note  PT has completed initial evaluation.    Overall, patient at contact guard progressing to close supervision assistance level.   PT Follow up recommended: Home Health PT Equipment recommended:  Cane Complete evaluation note to follow.     Bernardino JINNY Ruth, PT, DPT Acute Rehabilitation Office [phone]

## 2024-01-28 ENCOUNTER — Other Ambulatory Visit: Payer: Self-pay

## 2024-01-28 ENCOUNTER — Emergency Department (HOSPITAL_COMMUNITY): Admission: EM | Admit: 2024-01-28 | Discharge: 2024-01-29 | Disposition: A

## 2024-01-28 DIAGNOSIS — R531 Weakness: Secondary | ICD-10-CM | POA: Insufficient documentation

## 2024-01-28 DIAGNOSIS — R262 Difficulty in walking, not elsewhere classified: Secondary | ICD-10-CM

## 2024-01-28 DIAGNOSIS — G478 Other sleep disorders: Secondary | ICD-10-CM | POA: Insufficient documentation

## 2024-01-28 DIAGNOSIS — F320 Major depressive disorder, single episode, mild: Secondary | ICD-10-CM

## 2024-01-28 DIAGNOSIS — F418 Other specified anxiety disorders: Secondary | ICD-10-CM | POA: Insufficient documentation

## 2024-01-28 DIAGNOSIS — I10 Essential (primary) hypertension: Secondary | ICD-10-CM | POA: Insufficient documentation

## 2024-01-28 DIAGNOSIS — Z7982 Long term (current) use of aspirin: Secondary | ICD-10-CM | POA: Insufficient documentation

## 2024-01-28 LAB — DIFFERENTIAL
Abs Immature Granulocytes: 0.02 K/uL (ref 0.00–0.07)
Basophils Absolute: 0 K/uL (ref 0.0–0.1)
Basophils Relative: 1 %
Eosinophils Absolute: 0.2 K/uL (ref 0.0–0.5)
Eosinophils Relative: 3 %
Immature Granulocytes: 0 %
Lymphocytes Relative: 39 %
Lymphs Abs: 2.4 K/uL (ref 0.7–4.0)
Monocytes Absolute: 0.7 K/uL (ref 0.1–1.0)
Monocytes Relative: 11 %
Neutro Abs: 2.9 K/uL (ref 1.7–7.7)
Neutrophils Relative %: 46 %

## 2024-01-28 LAB — COMPREHENSIVE METABOLIC PANEL WITH GFR
ALT: 14 U/L (ref 0–44)
AST: 20 U/L (ref 15–41)
Albumin: 3.8 g/dL (ref 3.5–5.0)
Alkaline Phosphatase: 53 U/L (ref 38–126)
Anion gap: 9 (ref 5–15)
BUN: 14 mg/dL (ref 6–20)
CO2: 26 mmol/L (ref 22–32)
Calcium: 9.7 mg/dL (ref 8.9–10.3)
Chloride: 104 mmol/L (ref 98–111)
Creatinine, Ser: 1.09 mg/dL (ref 0.61–1.24)
GFR, Estimated: >60 mL/min (ref >60)
Glucose, Bld: 87 mg/dL (ref 70–99)
Potassium: 4.6 mmol/L (ref 3.5–5.1)
Sodium: 139 mmol/L (ref 135–145)
Total Bilirubin: 1.2 mg/dL (ref 0.0–1.2)
Total Protein: 7.7 g/dL (ref 6.5–8.1)

## 2024-01-28 LAB — CBC
HCT: 54.5 % — ABNORMAL HIGH (ref 39.0–52.0)
Hemoglobin: 17.9 g/dL — ABNORMAL HIGH (ref 13.0–17.0)
MCH: 31.8 pg (ref 26.0–34.0)
MCHC: 32.8 g/dL (ref 30.0–36.0)
MCV: 96.8 fL (ref 80.0–100.0)
Platelets: 303 K/uL (ref 150–400)
RBC: 5.63 MIL/uL (ref 4.22–5.81)
RDW: 13.3 % (ref 11.5–15.5)
WBC: 6.2 K/uL (ref 4.0–10.5)
nRBC: 0 % (ref 0.0–0.2)

## 2024-01-28 LAB — I-STAT CHEM 8, ED
BUN: 16 mg/dL (ref 6–20)
Calcium, Ion: 1.2 mmol/L (ref 1.15–1.40)
Chloride: 105 mmol/L (ref 98–111)
Creatinine, Ser: 1.2 mg/dL (ref 0.61–1.24)
Glucose, Bld: 88 mg/dL (ref 70–99)
HCT: 56 % — ABNORMAL HIGH (ref 39.0–52.0)
Hemoglobin: 19 g/dL — ABNORMAL HIGH (ref 13.0–17.0)
Potassium: 4.7 mmol/L (ref 3.5–5.1)
Sodium: 142 mmol/L (ref 135–145)
TCO2: 27 mmol/L (ref 22–32)

## 2024-01-28 LAB — CBG MONITORING, ED: Glucose-Capillary: 89 mg/dL (ref 70–99)

## 2024-01-28 LAB — ETHANOL: Alcohol, Ethyl (B): <15 mg/dL (ref <15)

## 2024-01-28 LAB — PROTIME-INR
INR: 1 (ref 0.8–1.2)
Prothrombin Time: 13.3 s (ref 11.4–15.2)

## 2024-01-28 LAB — APTT: aPTT: 34 s (ref 24–36)

## 2024-01-28 MED ORDER — PANTOPRAZOLE SODIUM 20 MG PO TBEC
20.0000 mg | DELAYED_RELEASE_TABLET | Freq: Every day | ORAL | Status: DC
  Administered 2024-01-28: 20 mg via ORAL
  Filled 2024-01-28: qty 1

## 2024-01-28 MED ORDER — SERTRALINE HCL 25 MG PO TABS
25.0000 mg | ORAL_TABLET | Freq: Every day | ORAL | Status: DC
  Administered 2024-01-28: 25 mg via ORAL
  Filled 2024-01-28: qty 1

## 2024-01-28 MED ORDER — AMLODIPINE BESYLATE 5 MG PO TABS
10.0000 mg | ORAL_TABLET | Freq: Every day | ORAL | Status: DC
  Administered 2024-01-28: 10 mg via ORAL
  Filled 2024-01-28: qty 2

## 2024-01-28 MED ORDER — ASPIRIN 81 MG PO TBEC
81.0000 mg | DELAYED_RELEASE_TABLET | Freq: Every day | ORAL | Status: DC
  Administered 2024-01-28: 81 mg via ORAL
  Filled 2024-01-28: qty 1

## 2024-01-28 MED ORDER — LORAZEPAM 1 MG PO TABS: 1.0000 mg | ORAL_TABLET | Freq: Four times a day (QID) | ORAL | Status: DC | PRN

## 2024-01-28 MED ORDER — ATORVASTATIN CALCIUM 80 MG PO TABS
80.0000 mg | ORAL_TABLET | Freq: Every day | ORAL | Status: DC
  Administered 2024-01-28: 80 mg via ORAL
  Filled 2024-01-28: qty 1

## 2024-01-28 NOTE — ED Triage Notes (Signed)
 Patient having worsening R sided weakness and slurred speech, for which he was evaluated yesterday and had negative MRI. Patient states unable to take any of his medications due to financial constraints. Also no stable housing at this time and states unable to get to work because he cannot walk since yesterday.

## 2024-01-28 NOTE — ED Notes (Signed)
 Pt ambulating in room. Provided with sandwich and drink per MD request. Pt updated on POC per MD, in agreement at this time to wait for TTS consult. Per MD, pt is not IVC at this time and can leave voluntarily if he wishes.

## 2024-01-28 NOTE — ED Notes (Signed)
Meal tray ordered per pt request.

## 2024-01-28 NOTE — ED Provider Notes (Cosign Needed Addendum)
 [facility] EMERGENCY DEPARTMENT AT [facility] Provider Note   CSN: [id number] Arrival date & time: 01/28/24  1050     Patient presents with: Weakness   Derek French is a 58 y.o. male.    Weakness  Patient is a 58 year old male present emergency room today for right sided upper and lower extremity weakness.  Patient was seen as a code stroke yesterday by me and Dr. Sal K of neurology.  He was seen for the same symptoms and had persistent symptoms after MRI.  Was eventually discharged home after his symptoms resolved.  He was diagnosed with functional neurologic disease with Dr. [name]      Prior to Admission medications   Medication Sig Start Date End Date Taking? Authorizing Provider  amLODipine  (NORVASC ) 10 MG tablet Take 1 tablet (10 mg total) by mouth daily. Patient not taking: Reported on 05/23/2023 09/01/22   Marlee Lynwood NOVAK, MD  aspirin  EC 81 MG tablet Take 1 tablet (81 mg total) by mouth daily. Swallow whole. 05/27/23   de Clint Kill, Cortney E, NP  atorvastatin  (LIPITOR ) 80 MG tablet Take 1 tablet (80 mg total) by mouth daily. Patient not taking: Reported on 05/23/2023 09/01/22   Marlee Lynwood NOVAK, MD  butalbital -acetaminophen -caffeine  (FIORICET ) 50-325-40 MG tablet Take 2 tablets by mouth every 8 (eight) hours as needed for headache. 05/26/23   de Clint Kill, Cortney E, NP  calcium  carbonate (TUMS - DOSED IN MG ELEMENTAL CALCIUM ) 500 MG chewable tablet Chew 2 tablets by mouth as needed for indigestion or heartburn.    [provider]  diclofenac  Sodium (VOLTAREN ) 1 % GEL Apply 4 g topically 4 (four) times daily. Patient not taking: Reported on 05/23/2023 08/31/22   Marlee Lynwood NOVAK, MD  ibuprofen  (ADVIL ) 800 MG tablet Take 1 tablet (800 mg total) by mouth 3 (three) times daily. 12/26/23   Vonn Hadassah LABOR, PA-C  indomethacin  (INDOCIN ) 50 MG capsule Take 1 capsule (50 mg total) by mouth 2 (two) times daily with a meal. Patient not taking: Reported on 05/23/2023 05/20/23    Kingsley, Victoria K, DO  lidocaine  (LIDODERM ) 5 % Place 1 patch onto the skin daily. Remove & Discard patch within 12 hours or as directed by MD Patient not taking: Reported on 05/23/2023 05/20/23   Kingsley, Victoria K, DO  methocarbamol  (ROBAXIN ) 500 MG tablet Take 1 tablet (500 mg total) by mouth 2 (two) times daily. 12/26/23   Vonn Hadassah LABOR, PA-C  nicotine  (NICODERM CQ  - DOSED IN MG/24 HOURS) 21 mg/24hr patch Place 1 patch (21 mg total) onto the skin daily. 05/27/23   de Clint Kill, Cortney E, NP  pantoprazole  (PROTONIX ) 20 MG tablet Take 1 tablet (20 mg total) by mouth daily. 12/26/23   Vonn Hadassah LABOR, PA-C    Allergies: Bee venom and Codeine    Review of Systems  Neurological:  Positive for weakness.    Updated Vital Signs BP (!) 174/102 (BP Location: Right Arm)   Pulse (!) 58   Temp 98.1 F (36.7 C) (Oral)   Resp 15   SpO2 100%   Physical Exam Vitals and nursing note reviewed.  Constitutional:      General: He is not in acute distress. HENT:     Head: Normocephalic and atraumatic.     Nose: Nose normal.  Eyes:     General: No scleral icterus. Cardiovascular:     Rate and Rhythm: Normal rate and regular rhythm.     Pulses: Normal pulses.  Heart sounds: Normal heart sounds.  Pulmonary:     Effort: Pulmonary effort is normal. No respiratory distress.     Breath sounds: No wheezing.  Abdominal:     Palpations: Abdomen is soft.     Tenderness: There is no abdominal tenderness.  Musculoskeletal:     Cervical back: Normal range of motion.     Right lower leg: No edema.     Left lower leg: No edema.  Skin:    General: Skin is warm and dry.     Capillary Refill: Capillary refill takes less than 2 seconds.  Neurological:     Mental Status: He is alert. Mental status is at baseline.     Comments: Hold right upper extremity against gravity and does not drop arm however unable to lift arm off of bed.  Able to stand and support himself however unable to lift right lower  extremity off of bed when laying in bed.  Sensation normal in all 4 extremities.  No facial droop noted.  Psychiatric:        Mood and Affect: Mood normal.        Behavior: Behavior normal.     (all labs ordered are listed, but only abnormal results are displayed) Labs Reviewed  CBC - Abnormal; Notable for the following components:      Result Value   Hemoglobin 17.9 (*)    HCT 54.5 (*)    All other components within normal limits  I-STAT CHEM 8, ED - Abnormal; Notable for the following components:   Hemoglobin 19.0 (*)    HCT 56.0 (*)    All other components within normal limits  PROTIME-INR  APTT  DIFFERENTIAL  COMPREHENSIVE METABOLIC PANEL WITH GFR  ETHANOL  CBG MONITORING, ED    EKG: EKG Interpretation Date/Time:  Saturday January 28 2024 14:19:16 EST Ventricular Rate:  53 PR Interval:  162 QRS Duration:  84 QT Interval:  418 QTC Calculation: 392 R Axis:   61  Text Interpretation: Sinus bradycardia Left ventricular hypertrophy with repolarization abnormality ( Sokolow-Lyon ) Abnormal ECG When compared with ECG of 27-Jan-2024 12:36, PREVIOUS ECG IS PRESENT Confirmed by Simon Rea [id number]) on 01/28/2024 2:45:05 PM  Radiology: MR BRAIN WO CONTRAST Result Date: 01/27/2024 EXAM: MRI BRAIN WITHOUT CONTRAST 01/27/2024 09:18:11 AM TECHNIQUE: Multiplanar multisequence MRI of the head/brain was performed without the administration of intravenous contrast. COMPARISON: MRI Brain 05/24/2023. CLINICAL HISTORY: 58 year old male. Neuro deficit, acute, stroke suspected. Right side weakness, numbness and aphasia. FINDINGS: BRAIN AND VENTRICLES: No acute infarct. No intracranial hemorrhage. No mass. No midline shift. No hydrocephalus. The sella is unremarkable. Vascular flow voids appear stable. Chronically advanced, patchy periventricular and scattered cerebral white matter T2 and FLAIR hyperintensity is not significantly changed, moderate. Configuration is nonspecific. No cortical  encephalomalacia identified. Chronic microhemorrhage in the left corona radiata on series 7 image 62 is stable. No other convincing chronic cerebral blood products. Deep gray nuclei, cerebellum remain normal. Possible tiny chronic lacunar infarct in the right pons is stable on series 8 image 16. ORBITS: No abnormality. SINUSES AND MASTOIDS: No abnormality. BONES AND SOFT TISSUES: Normal marrow signal. Negative visible cervical spine. Grossly normal visible internal auditory structures. No acute soft tissue abnormality. IMPRESSION: 1. No acute intracranial abnormality. 2. Stable chronic white matter disease, chronic solitary microhemorrhage in the left corona radiata, and possible tiny chronic lacunar infarct in the right pons. Electronically signed by: Helayne Hurst MD 01/27/2024 09:25 AM EST RP Workstation: [id number]  Procedures   Medications Ordered in the ED - No data to display  Clinical Course as of 01/28/24 1638  Sat Jan 28, 2024  1623 Functional neurologic patient. Normal CT's and MRI's. Has had similar Sx and was extensively evaluated. Neurology does not have any further recommendations. Waiting on Psych evaluation.  [CB]    Clinical Course User Index [CB] Beola Terrall RAMAN, PA-C                                 Medical Decision Making Amount and/or Complexity of Data Reviewed Labs: ordered. ECG/medicine tests: ordered.   This patient presents to the ED for concern of weakness, this involves a number of treatment options, and is a complaint that carries with it a moderate risk of complications and morbidity. A differential diagnosis was considered for the patient's symptoms which is discussed below:   The differential diagnosis of weakness includes but is not limited to neurologic causes (GBS, myasthenia gravis, CVA, MS, ALS, transverse myelitis, spinal cord injury, CVA, botulism, ) and other causes: ACS, Arrhythmia, syncope, orthostatic hypotension, sepsis, hypoglycemia, electrolyte  disturbance, hypothyroidism, respiratory failure, symptomatic anemia, dehydration, heat injury, polypharmacy, malignancy.     Co morbidities: Discussed in HPI   Brief History:  Patient is a 58 year old male present emergency room today for right sided upper and lower extremity weakness.  Patient was seen as a code stroke yesterday by me and Dr. Sal K of neurology.  He was seen for the same symptoms and had persistent symptoms after MRI.  Was eventually discharged home after his symptoms resolved.  He was diagnosed with functional neurologic disease with Dr. [name]    EMR reviewed including pt PMHx, past surgical history and past visits to ER.   See HPI for more details   Lab Tests:   I personally reviewed all laboratory work and imaging. Metabolic panel without any acute abnormality specifically kidney function within normal limits and no significant electrolyte abnormalities. CBC without leukocytosis or significant anemia.   Imaging Studies:  No imaging studies ordered for this patient Discussed with Sal K of neurology who recommends against additional imaging   Cardiac Monitoring:  The patient was maintained on a cardiac monitor.  I personally viewed and interpreted the cardiac monitored which showed an underlying rhythm of: NSR EKG non-ischemic   Medicines ordered:    Critical Interventions:     Consults/Attending Physician   I discussed this case with my attending physician who cosigned this note including patient's presenting symptoms, physical exam, and planned diagnostics and interventions. Attending physician stated agreement with plan or made changes to plan which were implemented.  Discussed with Sal K who recommends against additional imaging and agrees with my plan to obtain psychiatry evaluation.  Reevaluation:  After the interventions noted above I re-evaluated patient and found that they have :stayed the same   Social Determinants of  Health:  The patient's social determinants of health were a factor in the care of this patient    Problem List / ED Course:  Patient with previously diagnosed functional neurologic disease seen by neurology yesterday.  Discussed with neurology and my supervising physician and we have agreed to have patient see psychiatry.  His symptoms resolved yesterday without intervention.  Will see if he may benefit from psychiatric evaluation and any recommendations.   Dispostion:  He is medically cleared and awaiting psychiatry evaluation.  May ultimately require SNF placement/boarder status for  his deficits and inability to live alone     Final diagnoses:  None    ED Discharge Orders     None          Neldon Hamp RAMAN, [place] 01/28/24 1638    Neldon Hamp [place], [place] 01/28/24 1639

## 2024-01-28 NOTE — ED Notes (Addendum)
 Pt ambulatory to bathroom with standby assist. Safely returned to stretcher, provided with meal tray.

## 2024-01-28 NOTE — Discharge Instructions (Signed)
 It is imperative that you follow through with treatment recommendations within 5-7 days from the day of discharge to mitigate further risk to your safety and overall mental well-being.  A list of outpatient therapy and psychiatric providers for medication management has been provided below to get you started in finding the right provider for you.            [facility] [address] [place], [place], [address] [phone] phone (Medicare, Private insurance except Tricare, [place] Adwolf, and [facility])  [facility] [address] [place], [place], [address] [address] phone ([address], AmeriHealth [facility] [place], [address], [place], [place], Friday Health Plans, [address], BCBS Healthy [place], [place], [address], [place], [place], [place], [place], Tricare, [facility], Safeco Corporation, Eli Lilly and Company)  [employer] [phone] [address], Suite [place], [place], [address] [phone] phone [phone] phone [phone] fax  [facility] [address] [place], [place], [address] [phone] phone (Call to confirm insurance coverage) Consultation & Support Services     o Drop-In Hours: 1:00 PM to 5:00 PM     o Days: Monday - Thursday  Crisis Services (24/7)   [facility] [address] [place], [place], [address] [phone] phone ([address] [place], [place], [place] Medicaid, [place] and [place], [facility])      [facility] [address] [place], [place], [address] [phone] phone [email or web address]  (to complete the intake form and upload ID and insurance cards)  [facility] [address] [place], [place], [address] [phone] phone ([address], [address], [place], [address] [employer], [place], [place], [place], [facility], [place], and certain Medicaid plans)  [facility] [phone] [address] [place], [place], [address] [phone] phone [phone] fax (Medicaid, Medicare, Self-pay, call about other insurance coverage)  [facility] (age 85+) [address] [place], [place], [address] [phone] hone [phone] fax ([place], [address], [place], [place], [place], [address], [place], [place], [place], [place], certain [employer], [facility], UMR)  [employer] [address] [place], [place], [address] [phone] phone (Medicare, Medicaid, Artemio Aly, call about other insurance coverage)  [facility] [address] [place], [place], [address] [phone] phone [phone] fax (Call [phone] to see what insurance is accepted) Archer Asa, MD specializes in geropsych)  [facility], [facility]  (medication management only) [address] [place], [place], [address] [phone] phone [phone] fax ([address], Medicaid, [place], [place], [place], [place], [place], [place], [place])  [facility] (medication management only) [address] [place], [place], [address] [phone] phone [phone] fax ([address] Medicare, [place], [place], Tricare [place])  [facility] [address] [place], [place], [address] [phone] phone [phone] fax ([address], Tea Collums, [place], [place], [place], [facility], [facility] Medicaid/[place] Health Choice)  [facility] [place], [employer]. [address] [place], [place], [address] [phone] phone [phone] fax (Medicare, Medicaid, [facility])  [facility] [address] [place], [place] [address] [phone] phone ([address], [place], [place], [place], [place], Medicare, Ages, [facility]) Does genetic testing for medications; does transcranial magnetic stimulation along with basic services)  [facility] [address] [place], [place],  [address] [phone] phone (Call about insurance coverage)  [facility] [address] [place], [place], [address] [phone] phone [phone] fax (Call about insurance coverage)  [facility] [address] [place], [place], [address] [phone] phone [phone]  fax (Call about insurance coverage)  [employer] [phone] [address], [place], [address] [phone] phone (Medicaid, Tricare, [place], [place], [place])  [employer] [address] [place], [place], [address] [phone] phone (Medicaid, Medicare, call about other insurance coverage)  [facility] [address] [place], [place], [address] [phone] phone [phone] fax (Medicaid, Medicare, Tricare, call about other insurance coverage)  [facility] [address] [place], [address] [phone] phone ([address], [place], [place], [place], [place] types - Alliance, Secretary/administrator, Partners, Central, [place] Health Choice, Healthy [place], [place], [place], and Complete)  [facility] [address] [place], [place], [address] [phone] phone Completely online treatment platform Contact: Personal assistant - [employer] Specialist [phone] phone [phone] fax ([address], [place], [place], Friday Health Plan, [place], [place], [place], [place], [place], [place])

## 2024-01-28 NOTE — ED Provider Notes (Signed)
 Pt has been seen by psych who recommends outpatient tx and starting zoloft  25 mg daily.  He still does not feel like he can go home as he still has weakness (functional).  So, I have placed him in boarder status and have consulted SW and I have requested a PT eval.   Dean Clarity, MD 01/28/24 [phone]

## 2024-01-28 NOTE — ED Notes (Addendum)
 Pt provided with phone to call family. Pt has been cleared by psychiatry at this time, belongings returned to pt.

## 2024-01-28 NOTE — Consult Note (Signed)
 [facility] Psychiatric Consult Initial  Patient Name: .AHMADOU BOLZ  MRN: [id number]  DOB: [birth year]  Consult Order details:  Orders (From admission, onward)     Start     Ordered   01/28/24 1534  CONSULT TO CALL ACT TEAM       Ordering Provider: Neldon Hamp RAMAN, PA  Provider:  (Not yet assigned)  Question:  Reason for Consult?  Answer:  functional neuro issue   01/28/24 1533             Mode of Visit: In person    Psychiatry Consult Evaluation  Service Date: January 28, 2024 LOS:  LOS: 0 days  Chief Complaint right sided upper and lower extremity weakness  Primary Psychiatric Diagnoses  Current mild episode of major sleep disorder without prior episode   Assessment  KUNAL LEVARIO is a 58 y.o. male admitted: Presented to the EDfor 01/28/2024 10:50 AM for upper and lower right-sided extremity weakness. He endorses a psychiatric history that includes cocaine use disorder and has a medical history that includes CKD, TIA and strokelike episode, anemia due to GI blood loss, hypertension.   His current presentation of hopelessness, irritability, anxiety and decreased sleep is most consistent with mild depression. He does not meets criteria for psychiatric admission.    Patient presented to the ED yesterday with strokelike symptoms and was medically cleared and discharged home.  He returned today with recurrence of worsening of similar symptoms.  On evaluation, patient reports that he is highly independent Works full-time as for [employer] with [employer].  After discharge yesterday he stayed with a friend, as he is been living in a hotel since losing stable housing in April.  This morning he awoke with increased right sided weakness, worsening slurred speech, and a sensation of drooling.  He reports that his right side feels pins-and-needles and asleep.  He has a history of a prior TIA.  He called 911 this a.m. and Re presented to the ED.  Psychiatry was consulted due  to concerns for possible neuro functional issue.  Patient reports no prior psychiatric history.  He endorses mild depressive symptoms over the past few months related to multiple psychosocial stressors.  He lost his family home in April and has been essentially homeless since that time, staying in hotel.  He reports the hotel environment is stressful due to individuals frequently asking for money or food.  He continues to maintain full-time appointment but identifies finances, housing instability, and decreased sleep (averaging 4 hours per night) as major contributors to stress.  He reports intermittent feelings of hopelessness, irritability, anxiety.  His anxiety today is increased due to fear about his health and concerned that the doctor thinks I am making things up stating, I know my body and I know something is not right.  On examination, patient is cooperative, anxious, appropriate, and motivated for recovery.  He denies suicidal ideation, homicidal ideation, hallucinations, or paranoia.  He is able to verbally contract for safety.  Observed right sided weakness is notable.  He is unable to lift his right arm and can barely lift his right leg.  Speech is slurred, and mild drooling is observed during conversation.    Discussed the option of starting Zoloft  25 mg daily for mood and anxiety symptoms should patient require continued hospitalization; however, if medically cleared and discharged, he may follow-up with outpatient psychiatric services.  Resources and education were provided.  Risk, benefits, and possible adverse reactions were explained.  Patient verbalized understanding.  Given current presentation psychiatric admission is not indicated.  .   Please see plan below for detailed recommendations.   Diagnoses:  Active Hospital problems: Active Problems:   Current mild episode of major depressive disorder without prior episode    Plan   ## Psychiatric Medication Recommendations:   Recommend Zoloft  25 mg daily  ## Medical Decision Making Capacity: Not specifically addressed in this encounter  ## Further Work-up:  -- None  -- most recent EKG on 01/28/2024 had QtC of 392 -- Pertinent labwork reviewed earlier this admission includes: I-STAT Chem-8, ethanol, CMP, differential, CBC, APTT, pro time INR, CBG  MRI completed on 12/5   ## Disposition:-- There are no psychiatric contraindications to discharge at this time  ## Behavioral / Environmental: - No specific recommendations at this time.     ## Safety and Observation Level:  - Based on my clinical evaluation, I estimate the patient to be at low risk of self harm in the current setting. - At this time, we recommend  routine. This decision is based on my review of the chart including patient's history and current presentation, interview of the patient, mental status examination, and consideration of suicide risk including evaluating suicidal ideation, plan, intent, suicidal or self-harm behaviors, risk factors, and protective factors. This judgment is based on our ability to directly address suicide risk, implement suicide prevention strategies, and develop a safety plan while the patient is in the clinical setting. Please contact our team if there is a concern that risk level has changed.  CSSR Risk Category:C-SSRS RISK CATEGORY: No Risk  Suicide Risk Assessment: Patient has following modifiable risk factors for suicide: current symptoms: anxiety/panic, insomnia, impulsivity, anhedonia, hopelessness and pain, medical illness (ie new dx of cancer), which we are addressing by recommending outpatient psychiatric follow-up in op setting. Patient has following non-modifiable or demographic risk factors for suicide: male gender Patient has the following protective factors against suicide: Access to outpatient mental health care, Supportive friends, Frustration tolerance, no history of suicide attempts, and no history of  NSSIB  Thank you for this consult request. Recommendations have been communicated to the primary team.  We will sign off at this time.   Elveria VEAR Batter, NP       History of Present Illness  Relevant Aspects of Hospital ED Course:  Admitted on 12/6/2025for upper and lower right-sided extremity weakness. He endorses a psychiatric history that includes cocaine use disorder and has a medical history that includes CKD, TIA and strokelike episode, anemia due to GI blood loss, hypertension.   Patient Report:   I know my body and I know something is not right, I cannot move my right side  Hamp Bow PA, Patient is a 59 year old male present emergency room today for right sided upper and lower extremity weakness.   Patient was seen as a code stroke yesterday by me and Dr. Sal K of neurology.  He was seen for the same symptoms and had persistent symptoms after MRI.  Was eventually discharged home after his symptoms resolved.  He was diagnosed with functional neurologic disease with Dr. [name]  Psych ROS:  Depression: Endorses Anxiety: Endorses Mania (lifetime and current): Denies Psychosis: (lifetime and current): Denies  Collateral information:  None   Review of Systems  Constitutional:  Negative for chills and fever.  Respiratory:  Negative for cough.   Cardiovascular:  Negative for chest pain.  Gastrointestinal:  Negative for nausea and vomiting.  Musculoskeletal:  Right sided weakness  Neurological:  Negative for tremors.  Psychiatric/Behavioral:  Positive for depression. The patient is nervous/anxious.      Psychiatric and Social History  Psychiatric History:  Information collected from chart review, patient  Prev Dx/Sx: Cocaine use disorder Current Psych Provider: Denies Home Meds (current): Denies Previous Med Trials: Denies Therapy: Denies  Prior Psych Hospitalization: Denies Prior Self Harm: Denies Prior Violence: Denies  Family Psych History:  Denies Family Hx suicide: Denies  Social History:  Developmental Hx: Denies Educational Hx: High school graduate Occupational Hx: Full-time at [employer] Hx: Denies Living Situation: Currently lives [place] hotel Spiritual Hx: Deferred Access to weapons/lethal means: Denies  Substance History Alcohol: Denies Tobacco: Denies Illicit drugs: Denies-history of cocaine use disorder last use 4-5 years ago Prescription drug abuse: Denies Rehab hx: Has completed some substance abuse treatment in the past but cannot remember name  Exam Findings  Physical Exam:  Vital Signs:  Temp:  [97.8 F (36.6 C)-98.1 F (36.7 C)] 97.9 F (36.6 C) (12/06 1841) Pulse Rate:  [56-59] 59 (12/06 1841) Resp:  [15-16] 15 (12/06 1841) BP: (150-174)/(92-102) 150/92 (12/06 1841) SpO2:  [92 %-100 %] 98 % (12/06 1841) Blood pressure (!) 150/92, pulse (!) 59, temperature 97.9 F (36.6 C), temperature source Oral, resp. rate 15, SpO2 98%. There is no height or weight on file to calculate BMI.  Physical Exam Pulmonary:     Effort: No respiratory distress.  Musculoskeletal:     Comments: Right sided weakness  Neurological:     Mental Status: He is alert and oriented to person, place, and time.  Psychiatric:        Attention and Perception: Attention and perception normal.        Mood and Affect: Mood is anxious and depressed.        Speech: Speech normal.        Behavior: Behavior normal. Behavior is cooperative.        Thought Content: Thought content normal.        Cognition and Memory: Cognition normal.        Judgment: Judgment normal.     Mental Status Exam: General Appearance: Casual  Orientation:  Full (Time, Place, and Person)  Memory:  Immediate;   Good Recent;   Good Remote;   Good  Concentration:  Concentration: Good and Attention Span: Good  Recall:  Good  Attention  Good  Eye Contact:  Good  Speech:  Clear and Coherent  Language:  Fair-slurred  Volume:   Normal  Mood: Anxious mildly depressed  Affect:  Congruent  Thought Process:  Coherent  Thought Content:  Logical  Suicidal Thoughts:  No  Homicidal Thoughts:  No  Judgement:  Intact  Insight:  Good  Psychomotor Activity:  Normal  Akathisia:  No  Fund of Knowledge:  Good      Assets:  Financial Resources/Insurance Housing Physical Health Resilience Social Support  Cognition:  WNL  ADL's:  Impaired  AIMS (if indicated):        Other History   These have been pulled in through the EMR, reviewed, and updated if appropriate.  Family History:  The patient's family history includes Heart disease (age of onset: 40) in his father; Stroke in his father.  Medical History: Past Medical History:  Diagnosis Date   Anemia due to GI blood loss 01/16/2022   Chest pain 08/30/2022   Encounter for hepatitis C screening test for low risk patient 01/25/2022  Hypertension 05/03/2019   Recurrent boils    Stroke-like episode    07/2009, 03/2011, 09/2011    Surgical History: Past Surgical History:  Procedure Laterality Date   BIOPSY  01/19/2022   Procedure: BIOPSY;  Surgeon: Stacia Glendia BRAVO, MD;  Location: [facility] END[facility]COPY;  Service: Gastroenterology;;   COLONOSCOPY WITH PROPOFOL  N/A 01/18/2022   Procedure: COLONOSCOPY WITH PROPOFOL ;  Surgeon: Stacia Glendia BRAVO, MD;  Location: [facility] ENDOSCOPY;  Service: Gastroenterology;  Laterality: N/A;   ESOPHAGOGASTRODUODENOSCOPY N/A 01/19/2022   Procedure: ESOPHAGOGASTRODUODENOSCOPY (EGD);  Surgeon: Stacia Glendia BRAVO, MD;  Location: [facility] ENDOSCOPY;  Service: Gastroenterology;  Laterality: N/A;   HERNIA REPAIR       Medications:   Current Facility-Administered Medications:    amLODipine  (NORVASC ) tablet 10 mg, 10 mg, Oral, Daily, Haviland, Julie, MD   aspirin  EC tablet 81 mg, 81 mg, Oral, Daily, Haviland, Julie, MD   atorvastatin  (LIPITOR ) tablet 80 mg, 80 mg, Oral, Daily, Haviland, Julie, MD   LORazepam  (ATIVAN ) tablet 1 mg, 1 mg, Oral, Q6H PRN,  Haviland, Julie, MD   pantoprazole  (PROTONIX ) EC tablet 20 mg, 20 mg, Oral, Daily, Haviland, Julie, MD   sertraline  (ZOLOFT ) tablet 25 mg, 25 mg, Oral, Daily, Haviland, Julie, MD  Current Outpatient Medications:    amLODipine  (NORVASC ) 10 MG tablet, Take 1 tablet (10 mg total) by mouth daily. (Patient not taking: Reported on 05/23/2023), Disp: 30 tablet, Rfl: 0   aspirin  EC 81 MG tablet, Take 1 tablet (81 mg total) by mouth daily. Swallow whole., Disp: 30 tablet, Rfl: 12   atorvastatin  (LIPITOR ) 80 MG tablet, Take 1 tablet (80 mg total) by mouth daily. (Patient not taking: Reported on 05/23/2023), Disp: 30 tablet, Rfl: 0   butalbital -acetaminophen -caffeine  (FIORICET ) 50-325-40 MG tablet, Take 2 tablets by mouth every 8 (eight) hours as needed for headache., Disp: 14 tablet, Rfl: 0   calcium  carbonate (TUMS - DOSED IN MG ELEMENTAL CALCIUM ) 500 MG chewable tablet, Chew 2 tablets by mouth as needed for indigestion or heartburn., Disp: , Rfl:    diclofenac  Sodium (VOLTAREN ) 1 % GEL, Apply 4 g topically 4 (four) times daily. (Patient not taking: Reported on 05/23/2023), Disp: , Rfl:    ibuprofen  (ADVIL ) 800 MG tablet, Take 1 tablet (800 mg total) by mouth 3 (three) times daily., Disp: 21 tablet, Rfl: 0   indomethacin  (INDOCIN ) 50 MG capsule, Take 1 capsule (50 mg total) by mouth 2 (two) times daily with a meal. (Patient not taking: Reported on 05/23/2023), Disp: 30 capsule, Rfl: 0   lidocaine  (LIDODERM ) 5 %, Place 1 patch onto the skin daily. Remove & Discard patch within 12 hours or as directed by MD (Patient not taking: Reported on 05/23/2023), Disp: 30 patch, Rfl: 0   methocarbamol  (ROBAXIN ) 500 MG tablet, Take 1 tablet (500 mg total) by mouth 2 (two) times daily., Disp: 20 tablet, Rfl: 0   nicotine  (NICODERM CQ  - DOSED IN MG/24 HOURS) 21 mg/24hr patch, Place 1 patch (21 mg total) onto the skin daily., Disp: 28 patch, Rfl: 0   pantoprazole  (PROTONIX ) 20 MG tablet, Take 1 tablet (20 mg total) by mouth  daily., Disp: 90 tablet, Rfl: 1  Allergies: Allergies  Allergen Reactions   Bee Venom Anaphylaxis   Codeine Other (See Comments)    hallucinations     Elveria VEAR Batter, NP

## 2024-01-29 ENCOUNTER — Other Ambulatory Visit: Payer: Self-pay

## 2024-01-29 ENCOUNTER — Emergency Department (HOSPITAL_COMMUNITY)
Admission: EM | Admit: 2024-01-29 | Discharge: 2024-02-02 | Disposition: A | Source: Home / Self Care | Attending: Emergency Medicine | Admitting: Emergency Medicine

## 2024-01-29 ENCOUNTER — Encounter (HOSPITAL_COMMUNITY): Payer: Self-pay | Admitting: *Deleted

## 2024-01-29 DIAGNOSIS — R531 Weakness: Secondary | ICD-10-CM

## 2024-01-29 LAB — BASIC METABOLIC PANEL WITH GFR
Anion gap: 6 (ref 5–15)
BUN: 16 mg/dL (ref 6–20)
CO2: 22 mmol/L (ref 22–32)
Calcium: 8.6 mg/dL — ABNORMAL LOW (ref 8.9–10.3)
Chloride: 110 mmol/L (ref 98–111)
Creatinine, Ser: 1.04 mg/dL (ref 0.61–1.24)
GFR, Estimated: >60 mL/min (ref >60)
Glucose, Bld: 119 mg/dL — ABNORMAL HIGH (ref 70–99)
Potassium: 4.1 mmol/L (ref 3.5–5.1)
Sodium: 138 mmol/L (ref 135–145)

## 2024-01-29 LAB — CBC
HCT: 46 % (ref 39.0–52.0)
Hemoglobin: 15.7 g/dL (ref 13.0–17.0)
MCH: 32.1 pg (ref 26.0–34.0)
MCHC: 34.1 g/dL (ref 30.0–36.0)
MCV: 94.1 fL (ref 80.0–100.0)
Platelets: 293 K/uL (ref 150–400)
RBC: 4.89 MIL/uL (ref 4.22–5.81)
RDW: 13.3 % (ref 11.5–15.5)
WBC: 8.3 K/uL (ref 4.0–10.5)
nRBC: 0 % (ref 0.0–0.2)

## 2024-01-29 MED ORDER — LORAZEPAM 1 MG PO TABS: 1.0000 mg | ORAL_TABLET | Freq: Once | ORAL | Status: DC

## 2024-01-29 MED ORDER — LORAZEPAM 1 MG PO TABS
1.0000 mg | ORAL_TABLET | Freq: Four times a day (QID) | ORAL | Status: DC | PRN
  Administered 2024-01-29 – 2024-02-02 (×9): 1 mg via ORAL
  Filled 2024-01-29 (×9): qty 1

## 2024-01-29 MED ORDER — NICOTINE 21 MG/24HR TD PT24
21.0000 mg | MEDICATED_PATCH | Freq: Every day | TRANSDERMAL | Status: DC
  Administered 2024-01-29 – 2024-01-31 (×3): 21 mg via TRANSDERMAL
  Filled 2024-01-29 (×4): qty 1

## 2024-01-29 MED ORDER — PANTOPRAZOLE SODIUM 20 MG PO TBEC
20.0000 mg | DELAYED_RELEASE_TABLET | Freq: Every day | ORAL | Status: DC
  Administered 2024-01-29 – 2024-02-01 (×4): 20 mg via ORAL
  Filled 2024-01-29 (×4): qty 1

## 2024-01-29 MED ORDER — AMLODIPINE BESYLATE 5 MG PO TABS
10.0000 mg | ORAL_TABLET | Freq: Every day | ORAL | Status: DC
  Administered 2024-01-29 – 2024-02-01 (×4): 10 mg via ORAL
  Filled 2024-01-29 (×4): qty 2

## 2024-01-29 MED ORDER — SERTRALINE HCL 25 MG PO TABS
25.0000 mg | ORAL_TABLET | Freq: Every day | ORAL | Status: DC
  Administered 2024-01-29 – 2024-02-01 (×4): 25 mg via ORAL
  Filled 2024-01-29 (×7): qty 1

## 2024-01-29 MED ORDER — ACETAMINOPHEN 325 MG PO TABS
650.0000 mg | ORAL_TABLET | Freq: Once | ORAL | Status: AC
  Administered 2024-01-29: 650 mg via ORAL
  Filled 2024-01-29: qty 2

## 2024-01-29 MED ORDER — ATORVASTATIN CALCIUM 80 MG PO TABS
80.0000 mg | ORAL_TABLET | Freq: Every day | ORAL | Status: DC
  Administered 2024-01-29 – 2024-02-01 (×4): 80 mg via ORAL
  Filled 2024-01-29: qty 2
  Filled 2024-01-29: qty 1
  Filled 2024-01-29: qty 2
  Filled 2024-01-29: qty 1

## 2024-01-29 MED ORDER — ASPIRIN 81 MG PO TBEC
81.0000 mg | DELAYED_RELEASE_TABLET | Freq: Every day | ORAL | Status: DC
  Administered 2024-01-29 – 2024-02-01 (×4): 81 mg via ORAL
  Filled 2024-01-29 (×4): qty 1

## 2024-01-29 NOTE — ED Notes (Signed)
 Pt ambulated to bathroom

## 2024-01-29 NOTE — ED Provider Notes (Signed)
 I received a message from nursing staff that this patient has left the department after becoming distressed about another agitated patient in his area.   Pamella Ozell LABOR, DO 01/29/24 1027

## 2024-01-29 NOTE — ED Triage Notes (Signed)
 States he keeps coming back to the ed and is told he is not having a stroke, states he was put into purple and got scared, states he left AMA  patient still has IV in from when he was here.

## 2024-01-29 NOTE — Evaluation (Signed)
 Physical Therapy Evaluation Patient Details Name: Derek French MRN: [id number] DOB: Jun 23, 1965 Today's Date: 01/29/2024  History of Present Illness  58 y.o. male presents to [facility] ED 01/28/2024 for RUE and RLE weakness. Pt was seen as a CODE STROKE yesterday where MRI was negative for CVA. Pt was d/c'd home after his symptoms were resolved. Neurology has diagnosed him with functional neurologic disease. Psychiatry identified a current mild episode of major sleep disorder without prior episode. PMHx: CVA d/t vasospasm with cocaine abuse, CKD, cancer, dementia, CAD, CHF, MI, COPD, T2DM, HTN, hepatitis, fibromyalgia, sickle cell, multiple myeloma.   Clinical Impression  Pt admitted with above diagnosis. PTA, pt was independent with functional mobility, ADLs/IADLs, and working as a lobbyist. He has been staying at a motel since losing stable housing in April. Pt reports d/c'ing to a friend's house yesterday, but doesn't think he can continue to stay their and is unsure of the level of assist/support his friend could provided. Pt reference financial constraints in regards to continuing to stay at the motel since he hasn't been able to work for the past week. Pt stated he would try to reach out to his sister who lives in the country to see about the ability to stay with her and have her help him at d/c.  Pt currently with functional limitations due to the deficits listed below (see PT Problem List). He required CGA for bed mobility, CGA for transfers using RW, and CGA for short-distance ambulation using RW. Pt is currently limited by RHB weakness, decreased RUE and RLE sensation/coordination, decreased balance, and impaired activity tolerance. Given the significant change from baseline recommend intensive inpatient follow-up therapy, >3 hours/day. Pt has the potential to progress to a level where return home is appropriate and safe if increased support could be established. Pt will benefit from acute skilled  PT to increase his independence and safety with mobility to allow discharge.    If plan is discharge home, recommend the following: A little help with walking and/or transfers;A little help with bathing/dressing/bathroom;Assistance with cooking/housework;Assist for transportation;Help with stairs or ramp for entrance   Can travel by private vehicle        Equipment Recommendations Wheelchair (measurements PT);Wheelchair cushion (measurements PT);Rolling walker (2 wheels);BSC/3in1  Recommendations for Other Services  Rehab consult    Functional Status Assessment Patient has had a recent decline in their functional status and demonstrates the ability to make significant improvements in function in a reasonable and predictable amount of time.     Precautions / Restrictions Precautions Precautions: Fall Recall of Precautions/Restrictions: Impaired Restrictions Weight Bearing Restrictions Per Provider Order: No      Mobility  Bed Mobility Overal bed mobility: Needs Assistance Bed Mobility: Supine to Sit, Sit to Supine     Supine to sit: Contact guard, HOB elevated Sit to supine: Contact guard assist, HOB elevated   General bed mobility comments: Pt sat up on R side of the stretcher with slightly increased time. He brought BLE off bed and slight assist to raise trunk. Returning to bed pt required slight support to bring BLE into bed.    Transfers Overall transfer level: Needs assistance Equipment used: Rolling walker (2 wheels) Transfers: Sit to/from Stand Sit to Stand: Contact guard assist           General transfer comment: Introduced RW and educated pt on proper and safe use of AD. Cued proper hand placement using RW. Powered up with CGA. Good eccentric control.  Ambulation/Gait Ambulation/Gait assistance: Contact guard assist Gait Distance (Feet): 10 Feet Assistive device: Rolling walker (2 wheels) Gait Pattern/deviations: Step-to pattern, Decreased step length -  right, Decreased dorsiflexion - right, Narrow base of support Gait velocity: decreased     General Gait Details: Pt ambulated with short slow steps and a NBOS. Noted increased R hip flexion to compensate for decreased R DF and increased time in swing phase for RLE. No knee instability or buckling noted on RLE. Pt maintained upright posture with good proximity to RW. Observed tremor/shakiness in RUE. Pt maintained grip on RW bilaterally and navigated AD within room. Gait distance limited by pt declining to go further d/t fear of falling.  Stairs            Wheelchair Mobility     Tilt Bed    Modified Rankin (Stroke Patients Only)       Balance Overall balance assessment: Needs assistance Sitting-balance support: No upper extremity supported, Feet supported Sitting balance-Leahy Scale: Fair Sitting balance - Comments: Pt sat EOB with close supervision. He demonstrated a posterior lean during strength testing. Postural control: Posterior lean Standing balance support: Bilateral upper extremity supported, During functional activity, Reliant on assistive device for balance Standing balance-Leahy Scale: Poor Standing balance comment: Pt dependent on RW                             Pertinent Vitals/Pain Pain Assessment Pain Assessment: 0-10 Pain Score: 4  Pain Location: RUE and RLE Pain Descriptors / Indicators: Discomfort, Aching Pain Intervention(s): Monitored during session, Limited activity within patient's tolerance, Repositioned    Home Living Family/patient expects to be discharged to:: Unsure                   Additional Comments: Pt reports he d/c'd to his friend's house yesterday, but doesn't believe he can stay there long d/t his friend's landlord. Pt was previously residing in a motel, but reports it costs him $400/wk and that his budget is getting tight. Pt mentioned he has a sister in the country that he could try and reach out to, but is not  sure if he would be able to stay with her.    Prior Function Prior Level of Function : Independent/Modified Independent;Working/employed             Mobility Comments: Ambulates without AD. Denies fall hx. ADLs Comments: Indep with ADLs/IADLs. 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 Pt reports he hasn't been able to work for the past week. Takes public transportation.     Extremity/Trunk Assessment   Upper Extremity Assessment Upper Extremity Assessment: RUE deficits/detail;Right hand dominant RUE Deficits / Details: PROM WFL. Pt unable to lift arm off the bed. Brought RUE into 90deg of shoulder flex and pt able to maintain it, then the arm slowly starts to return to bed. Pt unable to touch his shoulder actively, but could maintain elbow flex when brought into it. Repeated each movement with inconsitencies noted in performance. Pt was able to form a fist, but limited ability to squeeze. Grossly 3-/5. Functionally, pt was able to hold onto a RW grip and navigate the AD during gait. Tremor/Shakiness noted only in RUE during mobility. RUE Sensation: decreased light touch RUE Coordination: decreased fine motor;decreased gross motor    Lower Extremity Assessment Lower Extremity Assessment: RLE deficits/detail RLE Deficits / Details: PROM WFL. Pt unable to lift leg off the bed. Once brought  into hip flex able to sustain it for a short period of time before lowering back to bed. Pt unable to kick out leg. Once brought into knee ext able to sustain it for a short period of time before knee became bent again. Pt unable to lift toes up in the ankle. Once placed in DF able to sustain it for a short period of time before foot became flat. Repeated each movement with inconsitencies noted in performance. Grossly 3-/5. Functionally, pt was able to stand and had adequate hip flexion to clear R foot which remained PF during gait. RLE Sensation: decreased light touch RLE Coordination: decreased  gross motor    Cervical / Trunk Assessment Cervical / Trunk Assessment: Normal  Communication   Communication Communication: Impaired Factors Affecting Communication: Difficulty expressing self (Pt with inconsistent speech presentation. Majority of the time it was smooth and clear, intermittent slow slightly slurred and slightly increased time for word finding.)    Cognition Arousal: Alert Behavior During Therapy: Flat affect, Anxious   PT - Cognitive impairments: No family/caregiver present to determine baseline, Problem solving                       PT - Cognition Comments: Pt A,Ox4. He appears to have delayed processing. Pt nervous to mobilize. Following commands: Intact       Cueing Cueing Techniques: Verbal cues     General Comments      Exercises     Assessment/Plan    PT Assessment Patient needs continued PT services  PT Problem List Decreased strength;Decreased balance;Decreased activity tolerance;Decreased mobility;Impaired sensation;Decreased coordination;Decreased knowledge of use of DME       PT Treatment Interventions DME instruction;Gait training;Functional mobility training;Therapeutic activities;Therapeutic exercise;Balance training;Neuromuscular re-education;Patient/family education    PT Goals (Current goals can be found in the Care Plan section)  Acute Rehab PT Goals Patient Stated Goal: Regain independence and return to work PT Goal Formulation: With patient Time For Goal Achievement: 02/12/24 Potential to Achieve Goals: Good    Frequency Min 3X/week     Co-evaluation               AM-PAC PT 6 Clicks Mobility  Outcome Measure Help needed turning from your back to your side while in a flat bed without using bedrails?: A Little Help needed moving from lying on your back to sitting on the side of a flat bed without using bedrails?: A Little Help needed moving to and from a bed to a chair (including a wheelchair)?: A  Little Help needed standing up from a chair using your arms (e.g., wheelchair or bedside chair)?: A Little Help needed to walk in hospital room?: A Lot Help needed climbing 3-5 steps with a railing? : A Lot 6 Click Score: 16    End of Session Equipment Utilized During Treatment: Gait belt Activity Tolerance: Patient tolerated treatment well Patient left: in bed;with family/visitor present Nurse Communication: Mobility status PT Visit Diagnosis: Other abnormalities of gait and mobility (R26.89);Muscle weakness (generalized) (M62.81);Other symptoms and signs involving the nervous system (R29.898);Difficulty in walking, not elsewhere classified (R26.2);Unsteadiness on feet (R26.81)    Time: 9245-9189 PT Time Calculation (min) (ACUTE ONLY): 16 min   Charges:   PT Evaluation $PT Eval Moderate Complexity: 1 Mod   PT General Charges $$ ACUTE PT VISIT: 1 Visit         Randall SAUNDERS, PT, DPT Acute Rehabilitation Services Office: [phone] Secure Chat Preferred  Delon CHRISTELLA Callander 01/29/2024, 8:34  AM

## 2024-01-29 NOTE — ED Triage Notes (Addendum)
 Pt back for same as seen for yesterday, pt discharged this morning but says he still doesn't feel right Pt anxious at check in

## 2024-01-29 NOTE — ED Notes (Signed)
Report received, assumed care of patient at this time.  

## 2024-01-29 NOTE — ED Provider Triage Note (Signed)
 Emergency Medicine Provider Triage Evaluation Note  Derek French , a 58 y.o. male  was evaluated in triage.  Pt homeless, seen yesterday, had code stroke activated on 12/5, work-up negative, seen by neurology and cleared, seen by psych and cleared. Plan was to board here to go to rehab, however states they put me in with the crazy people and that scared me even worse.  Returns today, reports I still dont feel right. Denies any changes in symptoms from yesterday  Review of Systems  Positive:  Negative:   Physical Exam  BP (!) 184/93   Pulse (!) 59   Temp (!) 97.5 F (36.4 C)   Resp 14   Ht 6' 3 (1.905 m)   Wt 72.7 kg   SpO2 100%   BMI 20.03 kg/m  Gen:   Awake, no distress   Resp:  Normal effort  MSK:   Moves extremities without difficulty  Other:    Medical Decision Making  Medically screening exam initiated at 1:59 PM.  Appropriate orders placed.  Derek French was informed that the remainder of the evaluation will be completed by another provider, this initial triage assessment does not replace that evaluation, and the importance of remaining in the ED until their evaluation is complete.     Nora Lauraine LABOR, PA-C 01/29/24 [phone]

## 2024-01-29 NOTE — Care Management (Signed)
 Transition of Care [facility]) - Emergency Department Mini Assessment   Patient Details  Name: Derek French MRN: [id number] Date of Birth: 1965/04/21  Transition of [facility]) CM/SW Contact:    Corean JAYSON Canary, RN Phone Number: 01/29/2024, 4:55 PM   Clinical Narrative:  Patient presents back to the ED after signing out earlier this morning AMA due to being scared when a patient  near him had agitation.  The patient currently is staying with a friend Had recently been working as a estate agent, Neurology symptoms in the last couple of days, negative for CVA has a history pertinent for cocaine abuse, CKD COPD , smoker, Multiple myeloma and sickle cell Patient was assessed by PT earlier today and was recommended CIR. He does not have  much family support and lack of residence would preclude him for this as well as that he is not admitted.  HH would likely not be able to be obtained with history and insurance as well as SNF. ED provider aware of this.  The patient doe snot have a PCP.   ED Mini Assessment: What brought you to the Emergency Department? : dont feel right           Interventions which prevented an admission or readmission:  (HH screening)    Patient Contact and Communications        ,                 Admission diagnosis:  generalized weakness Patient Active Problem List   Diagnosis Date Noted   Current mild episode of major depressive disorder without prior episode 01/28/2024   Stroke (HCC) 05/23/2023   Chest pain on breathing 08/31/2022   Pleuritic chest pain 08/30/2022   Tobacco abuse 08/30/2022   Encounter for hepatitis C screening test for low risk patient 01/25/2022   Weight loss 01/25/2022   Duodenal ulcer 01/19/2022   Right lower quadrant abdominal pain 01/18/2022   Lower GI bleed 01/18/2022   Acute GI bleeding 01/16/2022   Anemia due to GI blood loss 01/16/2022   Subungual hematoma of digit of hand 05/16/2019   HTN (hypertension)  05/03/2019   Cocaine use    Stroke (cerebrum) (HCC) 07/01/2015   Cerebral ischemia 09/26/2011   Cerebral infarction (HCC) 03/30/2011   Dyslipidemia 03/30/2011   Substance abuse (HCC) 03/30/2011   Cigarette smoker 03/30/2011   PCP:  Pcp, No Pharmacy:   [id number] [address] [place], [place] - [address] AT [facility] [address] [place] [address] Phone: [phone] Fax: [phone]  [facility] [address] [place] [address] Phone: [phone] Fax: [phone]

## 2024-01-29 NOTE — ED Notes (Signed)
 ED tech found pt curled up on Ed stretcher complaining that he just does not feel right. RN notified.

## 2024-01-29 NOTE — ED Provider Notes (Signed)
 [place] EMERGENCY DEPARTMENT AT [facility] Provider Note   CSN: [id number] Arrival date & time: 01/29/24  1307     Patient presents with: Weakness and Anxiety   Derek French is a 58 y.o. male.   Pt is a 58 yo male with pmhx significant for htn, hld, and functional weakness.  He initially presented on 12/5 as a code stroke.  He was worked up and found to not have a stroke and was d/c.  He came back yesterday with the same sx.  Neurology recommended a psych eval.  Psych recommended starting zoloft  at 25 mg per day.  He was still having trouble with weakness, so he agreed to stay overnight for TOC and PT eval.  The pt did see PT who recommended rehab.  He ended up leaving this am around 10 because he was in the psych area and there was a very agitated psych patient over there and it scared him.  He comes back because he still does not feel right.       Prior to Admission medications   Medication Sig Start Date End Date Taking? Authorizing Provider  amLODipine  (NORVASC ) 10 MG tablet Take 1 tablet (10 mg total) by mouth daily. Patient not taking: Reported on 05/23/2023 09/01/22   Marlee Lynwood NOVAK, MD  aspirin  EC 81 MG tablet Take 1 tablet (81 mg total) by mouth daily. Swallow whole. Patient not taking: Reported on 01/29/2024 05/27/23   de Clint Kill, Cortney E, NP  atorvastatin  (LIPITOR ) 80 MG tablet Take 1 tablet (80 mg total) by mouth daily. Patient not taking: Reported on 05/23/2023 09/01/22   Marlee Lynwood NOVAK, MD  butalbital -acetaminophen -caffeine  (FIORICET ) 50-325-40 MG tablet Take 2 tablets by mouth every 8 (eight) hours as needed for headache. Patient not taking: Reported on 01/29/2024 05/26/23   de Clint Kill, Cortney E, NP  calcium  carbonate (TUMS - DOSED IN MG ELEMENTAL CALCIUM ) 500 MG chewable tablet Chew 2 tablets by mouth as needed for indigestion or heartburn. Patient not taking: Reported on 01/29/2024    [provider]  diclofenac  Sodium (VOLTAREN ) 1 % GEL Apply 4  g topically 4 (four) times daily. Patient not taking: Reported on 05/23/2023 08/31/22   Marlee Lynwood NOVAK, MD  indomethacin  (INDOCIN ) 50 MG capsule Take 1 capsule (50 mg total) by mouth 2 (two) times daily with a meal. Patient not taking: Reported on 05/23/2023 05/20/23   Kingsley, Victoria K, DO  nicotine  (NICODERM CQ  - DOSED IN MG/24 HOURS) 21 mg/24hr patch Place 1 patch (21 mg total) onto the skin daily. Patient not taking: Reported on 01/29/2024 05/27/23   de Clint Kill, Cortney E, NP  pantoprazole  (PROTONIX ) 20 MG tablet Take 1 tablet (20 mg total) by mouth daily. Patient not taking: Reported on 01/29/2024 12/26/23   Vonn Hadassah LABOR, PA-C    Allergies: Bee venom and Codeine    Review of Systems  Neurological:  Positive for weakness.  All other systems reviewed and are negative.   Updated Vital Signs BP (!) 198/101 (BP Location: Right Arm)   Pulse 65   Temp 97.8 F (36.6 C) (Oral)   Resp 19   Ht 6' 3 (1.905 m)   Wt 72.7 kg   SpO2 100%   BMI 20.03 kg/m   Physical Exam Vitals and nursing note reviewed.  Constitutional:      Appearance: Normal appearance.  HENT:     Head: Normocephalic and atraumatic.     Right Ear: External ear normal.  Left Ear: External ear normal.     Nose: Nose normal.     Mouth/Throat:     Mouth: Mucous membranes are moist.     Pharynx: Oropharynx is clear.  Eyes:     Extraocular Movements: Extraocular movements intact.     Conjunctiva/sclera: Conjunctivae normal.     Pupils: Pupils are equal, round, and reactive to light.  Cardiovascular:     Rate and Rhythm: Normal rate and regular rhythm.     Pulses: Normal pulses.     Heart sounds: Normal heart sounds.  Pulmonary:     Effort: Pulmonary effort is normal.     Breath sounds: Normal breath sounds.  Abdominal:     General: Abdomen is flat. Bowel sounds are normal.     Palpations: Abdomen is soft.  Musculoskeletal:        General: Normal range of motion.     Cervical back: Normal range of motion  and neck supple.  Skin:    General: Skin is warm.     Capillary Refill: Capillary refill takes less than 2 seconds.  Neurological:     Mental Status: He is alert and oriented to person, place, and time.     Comments: Right sided weakness  Psychiatric:        Mood and Affect: Mood is anxious. Affect is tearful.     (all labs ordered are listed, but only abnormal results are displayed) Labs Reviewed  BASIC METABOLIC PANEL WITH GFR - Abnormal; Notable for the following components:      Result Value   Glucose, Bld 119 (*)    Calcium  8.6 (*)    All other components within normal limits  CBC    EKG: EKG Interpretation Date/Time:  Sunday January 29 2024 13:24:55 EST Ventricular Rate:  62 PR Interval:  152 QRS Duration:  84 QT Interval:  382 QTC Calculation: 387 R Axis:   75  Text Interpretation: Normal sinus rhythm Left ventricular hypertrophy with repolarization abnormality ( Sokolow-Lyon ) Abnormal ECG When compared with ECG of 28-Jan-2024 14:19, PREVIOUS ECG IS PRESENT No significant change since last tracing Confirmed by Dean Clarity [phone]) on 01/29/2024 4:40:29 PM  Radiology: No results found.   Procedures   Medications Ordered in the ED  amLODipine  (NORVASC ) tablet 10 mg (has no administration in time range)  aspirin  EC tablet 81 mg (has no administration in time range)  atorvastatin  (LIPITOR ) tablet 80 mg (has no administration in time range)  nicotine  (NICODERM CQ  - dosed in mg/24 hours) patch 21 mg (has no administration in time range)  pantoprazole  (PROTONIX ) EC tablet 20 mg (has no administration in time range)  sertraline  (ZOLOFT ) 20 MG/ML concentrated solution 25 mg (has no administration in time range)  LORazepam  (ATIVAN ) tablet 1 mg (has no administration in time range)                                    Medical Decision Making Risk OTC drugs. Prescription drug management.   This patient presents to the ED for concern of weakness, this involves  an extensive number of treatment options, and is a complaint that carries with it a high risk of complications and morbidity.  The differential diagnosis includes cva, electrolyte abn, infection, psych   Co morbidities that complicate the patient evaluation  htn, hld, and functional weakness   Additional history obtained:  Additional history obtained from epic chart review  Lab Tests:  I Ordered, and personally interpreted labs.  The pertinent results include:  cbc nl, bmp nl   Medicines ordered and prescription drug management:  I ordered medication including ativan   for sx  Reevaluation of the patient after these medicines showed that the patient improved I have reviewed the patients home medicines and have made adjustments as needed  Problem List / ED Course:  Functional weakness:  Pt just had a stroke work up and it was negative.  TOC consult placed.  Pt is in agreement to stay for possible rehab placement.  PT did see pt this am and did recommend skilled PT.   Reevaluation:  After the interventions noted above, I reevaluated the patient and found that they have :stayed the same   Social Determinants of Health:  Lives at home   Dispostion:  Pending TOC consult.     Final diagnoses:  Functional weakness    ED Discharge Orders     None          Dean Clarity, MD 01/29/24 1645

## 2024-01-29 NOTE — ED Notes (Addendum)
 Pt states that he is feeling anxious and doesn't know why he is in this area. Pt dressed himself in his clothes and is requesting his wallet, states we can't keep him here. Security retrieved pts wallet from security office. Pt ambulated independently out of hospital.

## 2024-01-29 NOTE — ED Notes (Signed)
 Pt currently walking with a limp. States the limp just came on and that his right leg isn't working properly.

## 2024-01-30 NOTE — [place] FL2 (Signed)
 [place]  MEDICAID FL2 LEVEL OF CARE FORM     IDENTIFICATION  Patient Name: Derek French Birthdate: Jun 16, 1965 Sex: male Admission Date (Current Location): 01/29/2024  [facility] and [place] Number:  Producer, Television/film/video and Address:  [facility],  [address] [place] [address]      Provider Number: [id number]  Attending Physician Name and Address:  No att. providers found  Relative Name and Phone Number:  Danh, Bayus (Mother)  [phone] [facility])    Current Level of Care: Hospital Recommended Level of Care: Other (Comment) Prior Approval Number:    Date Approved/Denied:   PASRR Number: [id number]  Discharge Plan: SNF    Current Diagnoses: Patient Active Problem List   Diagnosis Date Noted   Current mild episode of major depressive disorder without prior episode 01/28/2024   Stroke (HCC) 05/23/2023   Chest pain on breathing 08/31/2022   Pleuritic chest pain 08/30/2022   Tobacco abuse 08/30/2022   Encounter for hepatitis C screening test for low risk patient 01/25/2022   Weight loss 01/25/2022   Duodenal ulcer 01/19/2022   Right lower quadrant abdominal pain 01/18/2022   Lower GI bleed 01/18/2022   Acute GI bleeding 01/16/2022   Anemia due to GI blood loss 01/16/2022   Subungual hematoma of digit of hand 05/16/2019   HTN (hypertension) 05/03/2019   Cocaine use    Stroke (cerebrum) (HCC) 07/01/2015   Cerebral ischemia 09/26/2011   Cerebral infarction (HCC) 03/30/2011   Dyslipidemia 03/30/2011   Substance abuse (HCC) 03/30/2011   Cigarette smoker 03/30/2011    Orientation RESPIRATION BLADDER Height & Weight     Self, Time, Situation, Place  Normal Continent Weight: 160 lb 4.4 oz (72.7 kg) Height:  6' 3 (190.5 cm)  BEHAVIORAL SYMPTOMS/MOOD NEUROLOGICAL BOWEL NUTRITION STATUS      Continent Diet (Regular)  AMBULATORY STATUS COMMUNICATION OF NEEDS Skin   Supervision Verbally Normal                       Personal Care Assistance  Level of Assistance  Bathing, Feeding, Dressing Bathing Assistance: Limited assistance Feeding assistance: Independent Dressing Assistance: Limited assistance     Functional Limitations Info  Sight, Hearing, Speech Sight Info: Adequate Hearing Info: Adequate Speech Info: Adequate    SPECIAL CARE FACTORS FREQUENCY  PT (By licensed PT), OT (By licensed OT)     PT Frequency: x5/week OT Frequency: x5/week            Contractures Contractures Info: Not present    Additional Factors Info  Code Status, Allergies Code Status Info: Full Allergies Info: Bee Venom  Codeine           Current Medications (01/30/2024):  This is the current hospital active medication list Current Facility-Administered Medications  Medication Dose Route Frequency Provider Last Rate Last Admin   amLODipine  (NORVASC ) tablet 10 mg  10 mg Oral Daily Haviland, Julie, MD   10 mg at 01/30/24 1134   aspirin  EC tablet 81 mg  81 mg Oral Daily Haviland, Julie, MD   81 mg at 01/30/24 1134   atorvastatin  (LIPITOR ) tablet 80 mg  80 mg Oral Daily Haviland, Julie, MD   80 mg at 01/30/24 1134   LORazepam  (ATIVAN ) tablet 1 mg  1 mg Oral Q6H PRN Haviland, Julie, MD   1 mg at 01/30/24 1134   nicotine  (NICODERM CQ  - dosed in mg/24 hours) patch 21 mg  21 mg Transdermal Daily Haviland, Julie, MD  21 mg at 01/30/24 1142   pantoprazole  (PROTONIX ) EC tablet 20 mg  20 mg Oral Daily Haviland, Julie, MD   20 mg at 01/30/24 1134   sertraline  (ZOLOFT ) tablet 25 mg  25 mg Oral Daily Haviland, Julie, MD   25 mg at 01/30/24 1134   Current Outpatient Medications  Medication Sig Dispense Refill   amLODipine  (NORVASC ) 10 MG tablet Take 1 tablet (10 mg total) by mouth daily. (Patient not taking: Reported on 05/23/2023) 30 tablet 0   aspirin  EC 81 MG tablet Take 1 tablet (81 mg total) by mouth daily. Swallow whole. (Patient not taking: Reported on 01/29/2024) 30 tablet 12   atorvastatin  (LIPITOR ) 80 MG tablet Take 1 tablet (80 mg total) by  mouth daily. (Patient not taking: Reported on 05/23/2023) 30 tablet 0   butalbital -acetaminophen -caffeine  (FIORICET ) 50-325-40 MG tablet Take 2 tablets by mouth every 8 (eight) hours as needed for headache. (Patient not taking: Reported on 01/29/2024) 14 tablet 0   calcium  carbonate (TUMS - DOSED IN MG ELEMENTAL CALCIUM ) 500 MG chewable tablet Chew 2 tablets by mouth as needed for indigestion or heartburn. (Patient not taking: Reported on 01/29/2024)     diclofenac  Sodium (VOLTAREN ) 1 % GEL Apply 4 g topically 4 (four) times daily. (Patient not taking: Reported on 05/23/2023)     indomethacin  (INDOCIN ) 50 MG capsule Take 1 capsule (50 mg total) by mouth 2 (two) times daily with a meal. (Patient not taking: Reported on 05/23/2023) 30 capsule 0   nicotine  (NICODERM CQ  - DOSED IN MG/24 HOURS) 21 mg/24hr patch Place 1 patch (21 mg total) onto the skin daily. (Patient not taking: Reported on 01/29/2024) 28 patch 0   pantoprazole  (PROTONIX ) 20 MG tablet Take 1 tablet (20 mg total) by mouth daily. (Patient not taking: Reported on 01/29/2024) 90 tablet 1     Discharge Medications: Please see discharge summary for a list of discharge medications.  Relevant Imaging Results:  Relevant Lab Results:   Additional Information SSN:[id number]  Kari JONETTA Daisy, LCSW

## 2024-01-30 NOTE — Progress Notes (Addendum)
 CSW outreached to CIR for screening since PT recommended. Awaiting response.   Addend @ 2:16PM Reche Lowers screened pt and reported he is not eligible due to EMS status. CSW will fax out for SNF. Faxed out to 25+ facilities. ICM following.

## 2024-01-30 NOTE — ED Provider Notes (Signed)
 Emergency Medicine Observation Re-evaluation Note  Derek French is a 58 y.o. male, seen on rounds today.  Pt initially presented to the ED for complaints of Weakness and Anxiety Has had multiple prior episodes of stroke like symptoms (5 since 2011) with negative MRI, seen by Neurology 12/5, had continuing symptoms with visit with psychiatry, PT saw and recommending rehab.   Physical Exam  BP (!) 198/101 (BP Location: Right Arm)   Pulse 65   Temp 98.2 F (36.8 C)   Resp 19   Ht 6' 3 (1.905 m)   Wt 72.7 kg   SpO2 97%   BMI 20.03 kg/m  Physical Exam General: nad Cardiac: rr Lungs: even unlabored Psych: na  ED Course / MDM  EKG:EKG Interpretation Date/Time:  Sunday January 29 2024 13:24:55 EST Ventricular Rate:  62 PR Interval:  152 QRS Duration:  84 QT Interval:  382 QTC Calculation: 387 R Axis:   75  Text Interpretation: Normal sinus rhythm Left ventricular hypertrophy with repolarization abnormality ( Sokolow-Lyon ) Abnormal ECG When compared with ECG of 28-Jan-2024 14:19, PREVIOUS ECG IS PRESENT No significant change since last tracing Confirmed by Dean Clarity [phone]) on 01/29/2024 4:40:29 PM  I have reviewed the labs performed to date as well as medications administered while in observation.  Recent changes in the last 24 hours include none.  Plan  Current plan is for SNF.    Dreama Longs, MD 01/30/24 2150

## 2024-01-31 NOTE — ED Notes (Signed)
 Patient ambulated to bathroom with assistance of Noralee Dutko and standby assistance x 1. Placed patient in clean bed with clean linens, moved patient to room 43.

## 2024-01-31 NOTE — ED Provider Notes (Signed)
 Emergency Medicine Observation Re-evaluation Note  Derek French is a 58 y.o. male, seen on rounds today.  Pt initially presented to the ED for complaints of Weakness and Anxiety Currently, the patient is resting.  Physical Exam  BP (!) 141/86 (BP Location: Right Arm)   Pulse (!) 105   Temp 98 F (36.7 C) (Oral)   Resp 20   Ht 6' 3 (1.905 m)   Wt 72.7 kg   SpO2 100%   BMI 20.03 kg/m  Physical Exam General: nad  ED Course / MDM  EKG:EKG Interpretation Date/Time:  Sunday January 29 2024 13:24:55 EST Ventricular Rate:  62 PR Interval:  152 QRS Duration:  84 QT Interval:  382 QTC Calculation: 387 R Axis:   75  Text Interpretation: Normal sinus rhythm Left ventricular hypertrophy with repolarization abnormality ( Sokolow-Lyon ) Abnormal ECG When compared with ECG of 28-Jan-2024 14:19, PREVIOUS ECG IS PRESENT No significant change since last tracing Confirmed by Dean Clarity [phone]) on 01/29/2024 4:40:29 PM  I have reviewed the labs performed to date as well as medications administered while in observation.  Recent changes in the last 24 hours include no change.  Plan  Current plan is for placement.    Elnor Jayson LABOR, DO 01/31/24 [phone]

## 2024-01-31 NOTE — Progress Notes (Signed)
 CSW contacted Delon in admissions with [facility] inpatient rehab to see if they take patients insurance and CSW was told no.

## 2024-01-31 NOTE — ED Notes (Signed)
 Provided patient with supplies to bath, clean scrubs and toothbrush & toothpaste

## 2024-01-31 NOTE — Progress Notes (Signed)
 CSW expanded search for SNF bed. SNF referral sent to 33 additional SNFs

## 2024-02-01 NOTE — ED Provider Notes (Signed)
 Emergency Medicine Observation Re-evaluation Note  Derek French is a 58 y.o. male, seen on rounds today.  Pt initially presented to the ED for complaints of Weakness and Anxiety Currently, the patient is resting comfortably.  Physical Exam  BP (!) 129/92 (BP Location: Right Arm)   Pulse 98   Temp 99.8 F (37.7 C) (Oral)   Resp (!) 21   Ht 6' 3 (1.905 m)   Wt 72.7 kg   SpO2 95%   BMI 20.03 kg/m  Physical Exam General: No acute distress Cardiac: Regular rate and rhythm Lungs: No respiratory distress  ED Course / MDM  EKG:EKG Interpretation Date/Time:  Sunday January 29 2024 13:24:55 EST Ventricular Rate:  62 PR Interval:  152 QRS Duration:  84 QT Interval:  382 QTC Calculation: 387 R Axis:   75  Text Interpretation: Normal sinus rhythm Left ventricular hypertrophy with repolarization abnormality ( Sokolow-Lyon ) Abnormal ECG When compared with ECG of 28-Jan-2024 14:19, PREVIOUS ECG IS PRESENT No significant change since last tracing Confirmed by Dean Clarity [phone]) on 01/29/2024 4:40:29 PM  I have reviewed the labs performed to date as well as medications administered while in observation.  Recent changes in the last 24 hours include none.  Plan  Current plan is for social work evaluation and possible SNF placement.    Ula Prentice SAUNDERS, MD 02/01/24 [phone]

## 2024-02-01 NOTE — ED Notes (Signed)
Chaplain at bedside talking with patient. 

## 2024-02-01 NOTE — Progress Notes (Signed)
 CSW contacted Tammy (regional contact for [facility]) at [phone] regarding SNF bed for pt. Tammy stated pts insurance is not in network with their facilities. She reported she will reach out to pts insurance to determine if a one-time contract can be arranged for pt to go to SNF. SNF referral re-faxed to Tammy. CSW met with pt and discussed the current inability to locate an SNF that accepts pts insurance. CSW informed pt of Tammys efforts and explained that if insurance cannot approve a one-time contract, pt will need to return home with [facility] Pt verbalized understanding and is accepting of plan. CSW awaiting callback from Tammy.

## 2024-02-01 NOTE — Progress Notes (Signed)
 Prayer, emotional support.  Pt said he was experiencing some anxiety.  Chaplai available as needed.  Rayleen Dade,  [place], [facility], Pager [phone]

## 2024-02-01 NOTE — Progress Notes (Signed)
 CSW spoke with patient at bedside to find out his plan for discharge. The patient states he has no where to stay, but asked CSW to reach out to his Arland and brother in law Delton [phone]). The patient gave verbal permission to reach out to family and explain why he is in the hospital and why there is no accepting SNF at this time.The patient states he was working, but doesn't even know if he has a job anymore. The patient states he doesn't know the number to his job and cannot locate his phone. The patient also states he has no money to pay for a motel. CSW reached out to patients sister Arland who told CSW that patient cannot stay in her home. The patients brother in law stated that the patient has unpredictable behaviors and has stated before he was going to kill himself and the family. The brother in law also stated that patient would harass his mother. The patients brother in law told CSW that patient needs inpatient psych treatment and patient cannot navigate on his own on the street. The brother in law stated that patient was working part time for him, but wouldn't show up. The brother in law asked CSW to reach out to the ED provider to see if patient can be reconsidered for psych inpatient rehab. The brother in law stated he will speak with the doctor if he has too.    CSW reached back out to the ED provider and confirmed patient doesn't meet criteria for inpatient psych. CSW also informed patient that he cannot discharge to his sisters home. [facility], and [facility], but was not able to speak with anyone about bed availability. CSW will provide patient with the shelter lists. The patient is aware that he will need to discharge if the one time contract isn't approved by patients insurance. CSW also provided patient with the number to his employer so he could inform them of his hospital stay.

## 2024-02-01 NOTE — ED Notes (Signed)
Chaplain called per patient's request.

## 2024-02-02 NOTE — Progress Notes (Signed)
 Unable to obtain SNF bed for pt. Pt ambulation has improved. Community resources provided. Pt dc to [facility] to assist w/ accessing additional resources.

## 2024-02-02 NOTE — ED Provider Notes (Signed)
 Emergency Medicine Observation Re-evaluation Note  Derek French is a 58 y.o. male, seen on rounds today.  Pt initially presented to the ED for complaints of Weakness and Anxiety Currently, the patient is resting.  Physical Exam  BP 136/80   Pulse 74   Temp 98.3 F (36.8 C) (Oral)   Resp 16   Ht 6' 3 (1.905 m)   Wt 72.7 kg   SpO2 98%   BMI 20.03 kg/m  Physical Exam General: NAD Cardiac: Regular HR Lungs: No resp distress Psych: Stable  ED Course / MDM  EKG:EKG Interpretation Date/Time:  Sunday January 29 2024 13:24:55 EST Ventricular Rate:  62 PR Interval:  152 QRS Duration:  84 QT Interval:  382 QTC Calculation: 387 R Axis:   75  Text Interpretation: Normal sinus rhythm Left ventricular hypertrophy with repolarization abnormality ( Sokolow-Lyon ) Abnormal ECG When compared with ECG of 28-Jan-2024 14:19, PREVIOUS ECG IS PRESENT No significant change since last tracing Confirmed by Dean Clarity [phone]) on 01/29/2024 4:40:29 PM  I have reviewed the labs performed to date as well as medications administered while in observation.    Plan  Current plan is for [facility] assistance with placement options.  Pending SNF contract.  Appreciate TOC assistance as they appear to have exhausted nearly every effort to find a disposition for this patient.  From chart review notes, it does not appear that his family members are amenable to taking him in.  If no SNF option available patient likely to be discharged, as we cannot provide long-term housing in the ED   Josph Norfleet, Donnice PARAS, MD 02/02/24 [phone]

## 2024-02-02 NOTE — ED Provider Notes (Addendum)
 Social worker notes patient declined from SNF; we've exhausted all reasonable and available avenues for disposition.  Patient will be discharged at this time.  Per TOC can provide taxi voucher or ride to [facility], MD 02/02/24 [phone]    Cottie Donnice PARAS, MD 02/02/24 [phone]

## 2024-03-23 ENCOUNTER — Other Ambulatory Visit: Payer: Self-pay

## 2024-03-23 ENCOUNTER — Emergency Department (HOSPITAL_COMMUNITY)

## 2024-03-23 ENCOUNTER — Other Ambulatory Visit (HOSPITAL_COMMUNITY): Payer: Self-pay

## 2024-03-23 ENCOUNTER — Emergency Department (HOSPITAL_COMMUNITY)
Admission: EM | Admit: 2024-03-23 | Discharge: 2024-03-23 | Disposition: A | Discharge status: Home / Self Care | Attending: Emergency Medicine | Admitting: Emergency Medicine

## 2024-03-23 ENCOUNTER — Encounter (HOSPITAL_COMMUNITY): Payer: Self-pay

## 2024-03-23 DIAGNOSIS — K4090 Unilateral inguinal hernia, without obstruction or gangrene, not specified as recurrent: Secondary | ICD-10-CM | POA: Diagnosis not present

## 2024-03-23 DIAGNOSIS — I10 Essential (primary) hypertension: Secondary | ICD-10-CM | POA: Insufficient documentation

## 2024-03-23 DIAGNOSIS — Z8673 Personal history of transient ischemic attack (TIA), and cerebral infarction without residual deficits: Secondary | ICD-10-CM | POA: Insufficient documentation

## 2024-03-23 DIAGNOSIS — R109 Unspecified abdominal pain: Secondary | ICD-10-CM | POA: Diagnosis present

## 2024-03-23 LAB — COMPREHENSIVE METABOLIC PANEL WITH GFR
ALT: 17 U/L (ref 0–44)
AST: 21 U/L (ref 15–41)
Albumin: 4.4 g/dL (ref 3.5–5.0)
Alkaline Phosphatase: 71 U/L (ref 38–126)
Anion gap: 15 (ref 5–15)
BUN: 15 mg/dL (ref 6–20)
CO2: 21 mmol/L — ABNORMAL LOW (ref 22–32)
Calcium: 9.4 mg/dL (ref 8.9–10.3)
Chloride: 103 mmol/L (ref 98–111)
Creatinine, Ser: 1.08 mg/dL (ref 0.61–1.24)
GFR, Estimated: >60 mL/min
Glucose, Bld: 108 mg/dL — ABNORMAL HIGH (ref 70–99)
Potassium: 3.8 mmol/L (ref 3.5–5.1)
Sodium: 139 mmol/L (ref 135–145)
Total Bilirubin: 0.9 mg/dL (ref 0.0–1.2)
Total Protein: 7.7 g/dL (ref 6.5–8.1)

## 2024-03-23 LAB — URINALYSIS, ROUTINE W REFLEX MICROSCOPIC
Bacteria, UA: NONE SEEN
Bilirubin Urine: NEGATIVE
Glucose, UA: NEGATIVE mg/dL
Hgb urine dipstick: NEGATIVE
Ketones, ur: NEGATIVE mg/dL
Leukocytes,Ua: NEGATIVE
Nitrite: NEGATIVE
Protein, ur: 30 mg/dL — AB
Specific Gravity, Urine: 1.015 (ref 1.005–1.030)
pH: 5 (ref 5.0–8.0)

## 2024-03-23 LAB — CBC
HCT: 45.6 % (ref 39.0–52.0)
Hemoglobin: 15.4 g/dL (ref 13.0–17.0)
MCH: 32.6 pg (ref 26.0–34.0)
MCHC: 33.8 g/dL (ref 30.0–36.0)
MCV: 96.4 fL (ref 80.0–100.0)
Platelets: 310 10*3/uL (ref 150–400)
RBC: 4.73 MIL/uL (ref 4.22–5.81)
RDW: 14.6 % (ref 11.5–15.5)
WBC: 9.7 10*3/uL (ref 4.0–10.5)
nRBC: 0 % (ref 0.0–0.2)

## 2024-03-23 LAB — I-STAT CG4 LACTIC ACID, ED: Lactic Acid, Venous: 1.6 mmol/L (ref 0.5–1.9)

## 2024-03-23 LAB — LIPASE, BLOOD: Lipase: 20 U/L (ref 11–51)

## 2024-03-23 MED ORDER — HYDROMORPHONE HCL 1 MG/ML IJ SOLN
1.0000 mg | Freq: Once | INTRAMUSCULAR | Status: AC
  Administered 2024-03-23: 1 mg via INTRAVENOUS
  Filled 2024-03-23: qty 1

## 2024-03-23 MED ORDER — ONDANSETRON HCL 4 MG/2ML IJ SOLN
4.0000 mg | Freq: Once | INTRAMUSCULAR | Status: AC
  Administered 2024-03-23: 4 mg via INTRAVENOUS
  Filled 2024-03-23: qty 2

## 2024-03-23 MED ORDER — OXYCODONE-ACETAMINOPHEN 5-325 MG PO TABS
1.0000 | ORAL_TABLET | Freq: Four times a day (QID) | ORAL | 0 refills | Status: AC | PRN
  Filled 2024-03-23: qty 15, 4d supply, fill #0

## 2024-03-23 MED ORDER — SENNOSIDES-DOCUSATE SODIUM 8.6-50 MG PO TABS
1.0000 | ORAL_TABLET | Freq: Every evening | ORAL | 0 refills | Status: AC | PRN
  Filled 2024-03-23: qty 20, 20d supply, fill #0

## 2024-03-23 MED ORDER — SODIUM CHLORIDE 0.9 % IV BOLUS
500.0000 mL | Freq: Once | INTRAVENOUS | Status: AC
  Administered 2024-03-23: 500 mL via INTRAVENOUS

## 2024-03-23 MED ORDER — IOHEXOL 350 MG/ML SOLN
75.0000 mL | Freq: Once | INTRAVENOUS | Status: AC | PRN
  Administered 2024-03-23: 75 mL via INTRAVENOUS

## 2024-03-23 NOTE — ED Triage Notes (Signed)
 Pt coming in reporting he has a history of a hernia last night at work he was moving a crate and he felt pain and then noticed a lump in his groin. Pt reports that it will go back in but then comes right back out .

## 2024-03-23 NOTE — ED Triage Notes (Signed)
 Pt reporting urinary frequency

## 2024-03-23 NOTE — Discharge Instructions (Addendum)
 Please hold pressure over your groin if you will be coughing, sneezing, vomiting.  Please take the pain medications as prescribed and call the general surgery doctors for follow-up in the next week or two.

## 2024-03-25 ENCOUNTER — Emergency Department (HOSPITAL_COMMUNITY)
Admission: EM | Admit: 2024-03-25 | Discharge: 2024-03-25 | Disposition: A | Discharge status: Home / Self Care | Attending: Emergency Medicine | Admitting: Emergency Medicine

## 2024-03-25 ENCOUNTER — Encounter (HOSPITAL_COMMUNITY): Payer: Self-pay

## 2024-03-25 ENCOUNTER — Other Ambulatory Visit: Payer: Self-pay

## 2024-03-25 DIAGNOSIS — E119 Type 2 diabetes mellitus without complications: Secondary | ICD-10-CM | POA: Diagnosis not present

## 2024-03-25 DIAGNOSIS — R109 Unspecified abdominal pain: Secondary | ICD-10-CM | POA: Diagnosis present

## 2024-03-25 DIAGNOSIS — I10 Essential (primary) hypertension: Secondary | ICD-10-CM | POA: Diagnosis not present

## 2024-03-25 DIAGNOSIS — Z79899 Other long term (current) drug therapy: Secondary | ICD-10-CM | POA: Diagnosis not present

## 2024-03-25 DIAGNOSIS — K4090 Unilateral inguinal hernia, without obstruction or gangrene, not specified as recurrent: Secondary | ICD-10-CM | POA: Diagnosis not present

## 2024-03-25 DIAGNOSIS — Z7982 Long term (current) use of aspirin: Secondary | ICD-10-CM | POA: Diagnosis not present

## 2024-03-25 LAB — URINALYSIS, ROUTINE W REFLEX MICROSCOPIC
Bilirubin Urine: NEGATIVE
Glucose, UA: NEGATIVE mg/dL
Hgb urine dipstick: NEGATIVE
Ketones, ur: NEGATIVE mg/dL
Leukocytes,Ua: NEGATIVE
Nitrite: NEGATIVE
Protein, ur: NEGATIVE mg/dL
Specific Gravity, Urine: 1.025 (ref 1.005–1.030)
pH: 6 (ref 5.0–8.0)

## 2024-03-25 MED ORDER — IBUPROFEN 600 MG PO TABS: 600.0000 mg | ORAL_TABLET | Freq: Four times a day (QID) | ORAL | 0 refills | Status: AC | PRN

## 2024-03-25 MED ORDER — OXYCODONE-ACETAMINOPHEN 5-325 MG PO TABS
2.0000 | ORAL_TABLET | Freq: Once | ORAL | Status: AC
  Administered 2024-03-25: 2 via ORAL
  Filled 2024-03-25: qty 2

## 2024-03-25 MED ORDER — KETOROLAC TROMETHAMINE 15 MG/ML IJ SOLN
15.0000 mg | Freq: Once | INTRAMUSCULAR | Status: AC
  Administered 2024-03-25: 15 mg via INTRAMUSCULAR
  Filled 2024-03-25: qty 1

## 2024-03-25 NOTE — ED Notes (Signed)
 Pt provided with urinal on arrival and reminded just now that we still need the urine. He endorses that he is unable to urinate at this time.

## 2024-03-25 NOTE — ED Triage Notes (Addendum)
 Pt BIB [facility] from home,was at a facility earlier this week for a hernia. Golf ball size in groin, significant pain. Scheduled for surgery. Noticed blood in the urine starting today. Also noticed his hernia has gotten bigger today. Smells of UTI and endorses UTI symptoms. Took hydrocodone  for pain earlier today, not sure of the dose.   VS 130/70, HR 86, 99% RA

## 2024-03-25 NOTE — Discharge Instructions (Addendum)
 Your symptom is due to a right inguinal hernia.  It was easily reducible during this ER visit.  Please avoid any heavy lifting or strenuous activities as this may lead to hernia complication.  When you do have a hernia, lay down to the ground, applies ice pack to the affected area to help with the discomfort and apply steady pressure to the hernia to reduce it.  It is important for you to call and follow-up closely with general surgery for outpatient management including surgical repair.  Your urine today did not show any blood or any concerning finding.
